# Patient Record
Sex: Female | Born: 1955 | Race: White | Hispanic: No | Marital: Married | Smoking: Current every day smoker
Health system: Southern US, Community
[De-identification: ages and names within clinical notes are randomized; demographics above are authoritative.]

## PROBLEM LIST (undated history)

## (undated) DIAGNOSIS — R0600 Dyspnea, unspecified: Secondary | ICD-10-CM

## (undated) DIAGNOSIS — I10 Essential (primary) hypertension: Secondary | ICD-10-CM

## (undated) DIAGNOSIS — C679 Malignant neoplasm of bladder, unspecified: Secondary | ICD-10-CM

## (undated) DIAGNOSIS — R569 Unspecified convulsions: Secondary | ICD-10-CM

## (undated) DIAGNOSIS — R519 Headache, unspecified: Secondary | ICD-10-CM

## (undated) DIAGNOSIS — K219 Gastro-esophageal reflux disease without esophagitis: Secondary | ICD-10-CM

## (undated) DIAGNOSIS — R4020 Unspecified coma: Secondary | ICD-10-CM

## (undated) DIAGNOSIS — E119 Type 2 diabetes mellitus without complications: Secondary | ICD-10-CM

## (undated) DIAGNOSIS — C3490 Malignant neoplasm of unspecified part of unspecified bronchus or lung: Secondary | ICD-10-CM

## (undated) DIAGNOSIS — J449 Chronic obstructive pulmonary disease, unspecified: Secondary | ICD-10-CM

## (undated) DIAGNOSIS — I700 Atherosclerosis of aorta: Secondary | ICD-10-CM

## (undated) DIAGNOSIS — F4310 Post-traumatic stress disorder, unspecified: Secondary | ICD-10-CM

## (undated) DIAGNOSIS — M1990 Unspecified osteoarthritis, unspecified site: Secondary | ICD-10-CM

## (undated) DIAGNOSIS — F32A Depression, unspecified: Secondary | ICD-10-CM

## (undated) DIAGNOSIS — F319 Bipolar disorder, unspecified: Secondary | ICD-10-CM

## (undated) DIAGNOSIS — D649 Anemia, unspecified: Secondary | ICD-10-CM

## (undated) DIAGNOSIS — J189 Pneumonia, unspecified organism: Secondary | ICD-10-CM

## (undated) DIAGNOSIS — E785 Hyperlipidemia, unspecified: Secondary | ICD-10-CM

## (undated) DIAGNOSIS — J4489 Other specified chronic obstructive pulmonary disease: Secondary | ICD-10-CM

## (undated) DIAGNOSIS — F419 Anxiety disorder, unspecified: Secondary | ICD-10-CM

## (undated) DIAGNOSIS — H269 Unspecified cataract: Secondary | ICD-10-CM

## (undated) DIAGNOSIS — F329 Major depressive disorder, single episode, unspecified: Secondary | ICD-10-CM

## (undated) DIAGNOSIS — D329 Benign neoplasm of meninges, unspecified: Secondary | ICD-10-CM

## (undated) DIAGNOSIS — S0990XA Unspecified injury of head, initial encounter: Secondary | ICD-10-CM

## (undated) HISTORY — DX: Unspecified injury of head, initial encounter: S09.90XA

## (undated) HISTORY — DX: Essential (primary) hypertension: I10

## (undated) HISTORY — DX: Type 2 diabetes mellitus without complications: E11.9

## (undated) HISTORY — PX: SPINE SURGERY: SHX786

## (undated) HISTORY — DX: Anxiety disorder, unspecified: F41.9

## (undated) HISTORY — DX: Unspecified cataract: H26.9

## (undated) HISTORY — DX: Chronic obstructive pulmonary disease, unspecified: J44.9

## (undated) HISTORY — DX: Hyperlipidemia, unspecified: E78.5

## (undated) HISTORY — PX: FOOT SURGERY: SHX648

## (undated) HISTORY — DX: Post-traumatic stress disorder, unspecified: F43.10

## (undated) HISTORY — DX: Unspecified coma: R40.20

## (undated) HISTORY — DX: Benign neoplasm of meninges, unspecified: D32.9

## (undated) HISTORY — DX: Malignant neoplasm of unspecified part of unspecified bronchus or lung: C34.90

## (undated) HISTORY — PX: CARPAL TUNNEL RELEASE: SHX101

## (undated) HISTORY — DX: Unspecified convulsions: R56.9

## (undated) HISTORY — DX: Depression, unspecified: F32.A

## (undated) HISTORY — DX: Bipolar disorder, unspecified: F31.9

## (undated) HISTORY — PX: TUBAL LIGATION: SHX77

## (undated) HISTORY — DX: Malignant neoplasm of bladder, unspecified: C67.9

## (undated) HISTORY — PX: OTHER SURGICAL HISTORY: SHX169

---
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
## 2006-12-03 ENCOUNTER — Emergency Department (HOSPITAL_COMMUNITY): Admission: EM | Admit: 2006-12-03 | Discharge: 2006-12-03 | Payer: Self-pay | Admitting: Emergency Medicine

## 2011-12-06 HISTORY — PX: CHOLECYSTECTOMY: SHX55

## 2013-12-05 HISTORY — PX: COLONOSCOPY: SHX174

## 2014-08-06 LAB — PULMONARY FUNCTION TEST

## 2015-06-04 LAB — PULMONARY FUNCTION TEST

## 2016-01-19 DIAGNOSIS — R001 Bradycardia, unspecified: Secondary | ICD-10-CM | POA: Insufficient documentation

## 2016-01-22 DIAGNOSIS — F3132 Bipolar disorder, current episode depressed, moderate: Secondary | ICD-10-CM | POA: Insufficient documentation

## 2019-10-09 ENCOUNTER — Other Ambulatory Visit (HOSPITAL_COMMUNITY): Payer: Self-pay | Admitting: Internal Medicine

## 2019-10-09 DIAGNOSIS — Z1231 Encounter for screening mammogram for malignant neoplasm of breast: Secondary | ICD-10-CM

## 2019-10-10 ENCOUNTER — Encounter: Payer: Self-pay | Admitting: Internal Medicine

## 2019-10-23 ENCOUNTER — Ambulatory Visit: Payer: Self-pay | Admitting: Gastroenterology

## 2019-10-29 ENCOUNTER — Other Ambulatory Visit: Payer: Self-pay | Admitting: Internal Medicine

## 2019-10-29 DIAGNOSIS — D329 Benign neoplasm of meninges, unspecified: Secondary | ICD-10-CM

## 2019-10-29 NOTE — Progress Notes (Signed)
Referring Provider: Abran Richard, MD Primary Care Physician:  Abran Richard, MD Primary Gastroenterologist:  Dr. Gala Romney  Chief Complaint  Patient presents with  . Consult    tcs last done 5 years ago. Had polyps. Also reports cdif x2 in the past  . change in bowels    change have diarrhea for 3-5 days to having no BM in 3-5 days    HPI:   Vicki Blankenship is a 63 y.o. female presenting today at the request of Abran Richard, MD for consult colonoscopy, office visit due to meds.   Today she states her last colonoscopy was 5 years ago. Reports having colon polyps. Prior to colonoscopy, she had C. Diff. This was treated. At the time of colonoscopy, states they told her she had C. Diff again and was treated a second time.  Her significant watery diarrhea resolved.  She is now with chronic alternating diarrhea and constipation. Thinks a lot of this is due to stress. Worsened with recent move from [place]. Diarrhea for 3-5 days.  Will have 3-4 typically loose, sometimes watery BMs daily with associated urgency and abdominal cramping that resolves after a BM. Ice cream will trigger diarrhea.  Was eating ice cream several days a week.  Has tried to decrease ice cream intake and has started eating yogurt with whipped cream.  Maceo Pro foods also worsen diarrhea.  No nocturnal stools. Has had bright red blood in the stool. Occurs a couple times a month. Depends on "how stressed I am."   After diarrhea, may go 3-5 days without a BM. Doesn't drink much water. Urine is more yellow.   Admits to reflux. Taking omeprazole. Taking first thing in the morning.  Continues with reflux symptoms daily. Worse at bedtime. Drinks sprite daily. Eats a lot of spicy foods. No dysphagia. Occasional nausea. No vomiting. Had lost about 25 lbs when stressed related to her move.  She and her husband finally moved into their home in October, and she has gained about 15 pounds back.  Admits to epigastric pain. Present for several years.  Pretty constant. Taking 10 aspirin a day for headaches.  States she is stopping aspirin today and starting a different medication for migraines that has been prescribed by her primary doctor.   Past Medical History:  Diagnosis Date  . Anxiety   . Bipolar disorder ([place])   . COPD (chronic obstructive pulmonary disease) ([place])   . Depression   . Diabetes ([place])   . HLD (hyperlipidemia)   . HTN (hypertension)   . PTSD (post-traumatic stress disorder)     Past Surgical History:  Procedure Laterality Date  . CHOLECYSTECTOMY  2013  . COLONOSCOPY  2015   In [place].  Per patient, she had colon polyps.    Current Outpatient Medications  Medication Sig Dispense Refill  . Ascorbic Acid (VITA-C PO) Take by mouth daily. Only in winter months    . aspirin EC 81 MG tablet Take 81 mg by mouth daily.    Marland Kitchen aspirin-acetaminophen-caffeine (EXCEDRIN MIGRAINE) 250-250-65 MG tablet Take by mouth every 6 (six) hours as needed for headache.    Marland Kitchen atorvastatin (LIPITOR) 40 MG tablet Take 40 mg by mouth daily.    . Cyanocobalamin (B-12 PO) Take by mouth daily.    . divalproex (DEPAKOTE) 250 MG DR tablet Take 250 mg by mouth at bedtime.    . hydrochlorothiazide (HYDRODIURIL) 25 MG tablet Take 25 mg by mouth daily.    Marland Kitchen lithium carbonate 150 MG  capsule 1 capsule in AM and 2 capsules in evening    . losartan (COZAAR) 25 MG tablet Take 25 mg by mouth daily.    . metFORMIN (GLUCOPHAGE) 500 MG tablet Take 500 mg by mouth 2 (two) times daily.    . Multiple Vitamin (MULTIVITAMIN) tablet Take 1 tablet by mouth daily.    . OXYGEN Inhale into the lungs. 2 liters at night    . QUEtiapine (SEROQUEL) 200 MG tablet Take 100 mg by mouth 2 (two) times daily.    Marland Kitchen venlafaxine (EFFEXOR) 25 MG tablet Take 25 mg by mouth daily.    Marland Kitchen VITAMIN D PO Take by mouth.    . pantoprazole (PROTONIX) 40 MG tablet Take 1 tablet (40 mg total) by mouth daily before breakfast. 30 tablet 5   No current facility-administered medications for  this visit.     Allergies as of 10/30/2019 - Review Complete 10/30/2019  Allergen Reaction Noted  . Penicillins  10/30/2019  . Wellbutrin [bupropion]  10/30/2019    Family History  Problem Relation Age of Onset  . Colon cancer Neg Hx     Social History   Socioeconomic History  . Marital status: Married    Spouse name: Not on file  . Number of children: Not on file  . Years of education: Not on file  . Highest education level: Not on file  Occupational History  . Not on file  Social Needs  . Financial resource strain: Not on file  . Food insecurity    Worry: Not on file    Inability: Not on file  . Transportation needs    Medical: Not on file    Non-medical: Not on file  Tobacco Use  . Smoking status: Current Every Day Smoker    Types: Cigarettes  . Smokeless tobacco: Never Used  Substance and Sexual Activity  . Alcohol use: Never    Frequency: Never  . Drug use: Never  . Sexual activity: Not on file  Lifestyle  . Physical activity    Days per week: Not on file    Minutes per session: Not on file  . Stress: Not on file  Relationships  . Social Herbalist on phone: Not on file    Gets together: Not on file    Attends religious service: Not on file    Active member of club or organization: Not on file    Attends meetings of clubs or organizations: Not on file    Relationship status: Not on file  . Intimate partner violence    Fear of current or ex partner: Not on file    Emotionally abused: Not on file    Physically abused: Not on file    Forced sexual activity: Not on file  Other Topics Concern  . Not on file  Social History Narrative  . Not on file    Review of Systems: Gen: Denies any fever, chills, pre-syncope, or syncope.  CV: Denies chest pain. Admits to occasional heart palpitations. Associated with anxiety.  Resp: Admits to shortness of breath at times and chronic smokers cough cough. Sleeps with 2L nasal canula due to COPD. GI: See  HPI GU : Had an episode of urinary incontinence recently.  Reports recent urinalysis with PCP was negative. Has discussed incontinent episode with PCP.  MS: Denies joint pain Derm: Denies rash Psych: Admits to depression, anxiety Heme: See HPI  Physical Exam: BP 135/82   Pulse 71   Temp 98.3  F (36.8 C) (Oral)   Ht 5\' 4"  (1.626 m)   Wt 181 lb 12.8 oz (82.5 kg)   BMI 31.21 kg/m  General:   Alert and oriented. Pleasant and cooperative. Well-nourished and well-developed.  Head:  Normocephalic and atraumatic. Eyes:  Without icterus, sclera clear and conjunctiva pink.  Ears:  Normal auditory acuity. Lungs:  Clear to auscultation bilaterally. No wheezes, rales, or rhonchi. No distress.  Heart:  S1, S2 present without murmurs appreciated.  Abdomen:  +BS, soft, non-distended.  Very mild tenderness to palpation in the epigastric area. No HSM noted. No guarding or rebound. No masses appreciated.  Rectal:  Deferred  Msk:  Symmetrical without gross deformities. Normal posture. Extremities: 1+ bilateral lower extremity pitting edema. Neurologic:  Alert and  oriented x4;  grossly normal neurologically. Skin:  Intact without significant lesions or rashes. Psych: Normal mood and affect.

## 2019-10-30 ENCOUNTER — Ambulatory Visit (INDEPENDENT_AMBULATORY_CARE_PROVIDER_SITE_OTHER): Payer: Medicare HMO | Admitting: Gastroenterology

## 2019-10-30 ENCOUNTER — Other Ambulatory Visit: Payer: Self-pay

## 2019-10-30 ENCOUNTER — Encounter: Payer: Self-pay | Admitting: Gastroenterology

## 2019-10-30 VITALS — BP 135/82 | HR 71 | Temp 98.3°F | Ht 64.0 in | Wt 181.8 lb

## 2019-10-30 DIAGNOSIS — Z86010 Personal history of colonic polyps: Secondary | ICD-10-CM | POA: Diagnosis not present

## 2019-10-30 DIAGNOSIS — K219 Gastro-esophageal reflux disease without esophagitis: Secondary | ICD-10-CM

## 2019-10-30 DIAGNOSIS — R198 Other specified symptoms and signs involving the digestive system and abdomen: Secondary | ICD-10-CM

## 2019-10-30 DIAGNOSIS — R1013 Epigastric pain: Secondary | ICD-10-CM

## 2019-10-30 MED ORDER — PANTOPRAZOLE SODIUM 40 MG PO TBEC: 40.0000 mg | DELAYED_RELEASE_TABLET | Freq: Every day | ORAL | 5 refills | Status: DC

## 2019-10-30 NOTE — Assessment & Plan Note (Addendum)
63 year old female with past medical history of anxiety, depression, bipolar disorder, diabetes, HTN, HLD, COPD on 2 L supplemental oxygen via nasal cannula at nighttime who presents to schedule her surveillance colonoscopy.  Last colonoscopy about 5 years ago in [place].  Per patient she had colon polyps.  Reports prior to colonoscopy, she had C. difficile and was treated, then at time of colonoscopy, was told she had C. difficile again and was treated a second time.  Her frequent watery diarrhea resolved but she continues to have alternating constipation and diarrhea for a week at a time.  With diarrhea, she will have 3-4 stools typically loose, but can be watery.  Worse with dairy products and fried foods.  Associated abdominal cramping that resolves after BM and urgency.  No nocturnal stools.  Also admits to chronic history of intermittent bright red blood in her stool that occurs a couple times a month.  She states it depends on "how stress I am."  Lost about 25 pounds when moving from [place] in July due to stress; however, she has gained about 15 pounds back.  Significant daily aspirin use for migraines. No family history of colon cancer.  Rectal bleeding may be secondary to hemorrhoids or colon polyps.  Doubt IBD or malignancy.  Suspect her diarrhea is likely to lactose intolerance versus IBS.  Proceed with TCS with propofol with Dr. Gala Romney in the near future. The risks, benefits, and alternatives have been discussed in detail with patient. They have stated understanding and desire to proceed.  Propofol due to polypharmacy. Add Benefiber or Metamucil daily. MiraLAX 1 capful daily as needed for constipation. Trial lactose-free diet x2 weeks due to diarrhea. Stop aspirin and avoid all NSAIDs. Follow-up in office after procedure.

## 2019-10-30 NOTE — Assessment & Plan Note (Addendum)
63 year old female with anxiety, depression, bipolar disorder, and diabetes who also has long history of alternating constipation and diarrhea.  Reports 2 episodes of C. difficile about 5 years ago.  Her significant diarrhea resolved after treatment, but she continues with alternating bowel patterns with diarrhea for 3-5 days followed by constipation for 3-5 days. With diarrhea, she will have 3-4 stools daily, typically loose, but can be watery.  Worse with dairy products and fried foods.  Associated abdominal cramping that resolves after BM and urgency.  No nocturnal stools.  Minimal water intake.  Also with chronic history of intermittent bright red blood in her stool that occurs a couple times a month.  States it depends on "how stress I am."  Lost about 25 pounds when moving from [place] in July due to stress; however, she has gained about 15 pounds back.  Significant daily aspirin use for migraines. No family history of colon cancer.  She is due for repeat colonoscopy at this time with last colonoscopy about 5 years ago in [place].  Per patient she had colon polyps.  Suspect patient's alternating bowel habits may be secondary to IBS mixed type versus baseline constipation with diarrhea secondary to lactose intolerance as she eats dairy frequently and notes worsening of diarrhea during these times.  Clinical picture not entirely consistent with IBD; however, with reported rectal bleeding, this is still in the differential.   Add Benefiber or Metamucil daily. MiraLAX 1 capful daily as needed for constipation. Trial lactose-free diet x2 weeks. Increase water intake. Proceed with TCS with propofol with Dr. Gala Romney in the near future. The risks, benefits, and alternatives have been discussed in detail with patient. They have stated understanding and desire to proceed.  She was advised to ensure her bowels are moving well with the help of MiraLAX as needed the week prior to her colonoscopy. Follow-up after  procedure.

## 2019-10-30 NOTE — Assessment & Plan Note (Signed)
Addressed under GERD

## 2019-10-30 NOTE — Patient Instructions (Addendum)
We will get you scheduled for a colonoscopy in the near future with Dr. Buford Dresser. 1 day prior to your procedure: Take metformin 250 mg in the morning and evening Day of your procedure: Hold diabetes medications the morning of your procedure.  Please stop omeprazole and start Protonix 40 mg daily 30 minutes before breakfast.  I have sent a prescription to your pharmacy.  Follow a GERD diet.  Avoid fried, fatty, greasy, spicy, and citrus foods.  Avoid carbonated beverages, caffeine, and chocolate.  Do not eat within 3 hours of laying down.  Try propping the head of your bed up on water bricks to create a 6 inch incline.  See GERD handout below.  Avoid all NSAIDs.  This includes aspirin, Aleve, ibuprofen, Advil, and Goody powders.  Please add Benefiber or Metamucil daily.  These are fiber supplements that should help with your constipation.  Start at a low dose and increase slowly.   You may use MiraLAX 1 capful daily as needed for intermittent constipation.  Please be sure your bowels are moving well the week before your colonoscopy.  Please try following a lactose-free diet for at least the next 2 weeks.  If you may inadvertently consume lactose/dairy, you should take Lactaid tablets prior to the meal.  We will follow-up with you in the office after your colonoscopy.  Aliene Altes, PA-C [facility]    Food Choices for Gastroesophageal Reflux Disease, Adult When you have gastroesophageal reflux disease (GERD), the foods you eat and your eating habits are very important. Choosing the right foods can help ease your discomfort. Think about working with a nutrition specialist (dietitian) to help you make good choices. What are tips for following this plan?  Meals  Choose healthy foods that are low in fat, such as fruits, vegetables, whole grains, low-fat dairy products, and lean meat, fish, and poultry.  Eat small meals often instead of 3 large meals a day. Eat your meals  slowly, and in a place where you are relaxed. Avoid bending over or lying down until 2-3 hours after eating.  Avoid eating meals 2-3 hours before bed.  Avoid drinking a lot of liquid with meals.  Cook foods using methods other than frying. Bake, grill, or broil food instead.  Avoid or limit: ? Chocolate. ? Peppermint or spearmint. ? Alcohol. ? Pepper. ? Black and decaffeinated coffee. ? Black and decaffeinated tea. ? Bubbly (carbonated) soft drinks. ? Caffeinated energy drinks and soft drinks.  Limit high-fat foods such as: ? Fatty meat or fried foods. ? Whole milk, cream, butter, or ice cream. ? Nuts and nut butters. ? Pastries, donuts, and sweets made with butter or shortening.  Avoid foods that cause symptoms. These foods may be different for everyone. Common foods that cause symptoms include: ? Tomatoes. ? Oranges, lemons, and limes. ? Peppers. ? Spicy food. ? Onions and garlic. ? Vinegar. Lifestyle  Maintain a healthy weight. Ask your doctor what weight is healthy for you. If you need to lose weight, work with your doctor to do so safely.  Exercise for at least 30 minutes for 5 or more days each week, or as told by your doctor.  Wear loose-fitting clothes.  Do not smoke. If you need help quitting, ask your doctor.  Sleep with the head of your bed higher than your feet. Use a wedge under the mattress or blocks under the bed frame to raise the head of the bed. Summary  When you have gastroesophageal reflux disease (GERD),  food and lifestyle choices are very important in easing your symptoms.  Eat small meals often instead of 3 large meals a day. Eat your meals slowly, and in a place where you are relaxed.  Limit high-fat foods such as fatty meat or fried foods.  Avoid bending over or lying down until 2-3 hours after eating.  Avoid peppermint and spearmint, caffeine, alcohol, and chocolate. This information is not intended to replace advice given to you by your  health care provider. Make sure you discuss any questions you have with your health care provider. Document Released: 05/22/2012 Document Revised: 03/14/2019 Document Reviewed: 12/27/2016 Elsevier Patient Education  [place].  Lactose-Free Diet, Adult If you have lactose intolerance, you are not able to digest lactose. Lactose is a natural sugar found mainly in dairy milk and dairy products. You may need to avoid all foods and beverages that contain lactose. A lactose-free diet can help you do this. Which foods have lactose? Lactose is found in dairy milk and dairy products, such as:  Yogurt.  Cheese.  Butter.  Margarine.  Sour cream.  Cream.  Whipped toppings and nondairy creamers.  Ice cream and other dairy-based desserts. Lactose is also found in foods or products made with dairy milk or milk ingredients. To find out whether a food contains dairy milk or a milk ingredient, look at the ingredients list. Avoid foods with the statement "May contain milk" and foods that contain:  Milk powder.  Whey.  Curd.  Caseinate.  Lactose.  Lactalbumin.  Lactoglobulin. What are alternatives to dairy milk and foods made with milk products?  Lactose-free milk.  Soy milk with added calcium and vitamin D.  Almond milk, coconut milk, rice milk, or other nondairy milk alternatives with added calcium and vitamin D. Note that these are low in protein.  Soy products, such as soy yogurt, soy cheese, soy ice cream, and soy-based sour cream.  Other nut milk products, such as almond yogurt, almond cheese, cashew yogurt, cashew cheese, cashew ice cream, coconut yogurt, and coconut ice cream. What are tips for following this plan?  Do not consume foods, beverages, vitamins, minerals, or medicines containing lactose. Read ingredient lists carefully.  Look for the words "lactose-free" on labels.  Use lactase enzyme drops or tablets as directed by your health care provider.  Use  lactose-free milk or a milk alternative, such as soy milk or almond milk, for drinking and cooking.  Make sure you get enough calcium and vitamin D in your diet. A lactose-free eating plan can be lacking in these important nutrients.  Take calcium and vitamin D supplements as directed by your health care provider. Talk to your health care provider about supplements if you are not able to get enough calcium and vitamin D from food. What foods can I eat?  Fruits All fresh, canned, frozen, or dried fruits that are not processed with lactose. Vegetables All fresh, frozen, and canned vegetables without cheese, cream, or butter sauces. Grains Any that are not made with dairy milk or dairy products. Meats and other proteins Any meat, fish, poultry, and other protein sources that are not made with dairy milk or dairy products. Soy cheese and yogurt. Fats and oils Any that are not made with dairy milk or dairy products. Beverages Lactose-free milk. Soy, rice, or almond milk with added calcium and vitamin D. Fruit and vegetable juices. Sweets and desserts Any that are not made with dairy milk or dairy products. Seasonings and condiments Any that are  not made with dairy milk or dairy products. Calcium Calcium is found in many foods that contain lactose and is important for bone health. The amount of calcium you need depends on your age:  Adults younger than 50 years: 1,000 mg of calcium a day.  Adults older than 50 years: 1,200 mg of calcium a day. If you are not getting enough calcium, you may get it from other sources, including:  Orange juice with calcium added. There are 300-350 mg of calcium in 1 cup of orange juice.  Calcium-fortified soy milk. There are 300-400 mg of calcium in 1 cup of calcium-fortified soy milk.  Calcium-fortified rice or almond milk. There are 300 mg of calcium in 1 cup of calcium-fortified rice or almond milk.  Calcium-fortified breakfast cereals. There are  100-1,000 mg of calcium in calcium-fortified breakfast cereals.  Spinach, cooked. There are 145 mg of calcium in  cup of cooked spinach.  Edamame, cooked. There are 130 mg of calcium in  cup of cooked edamame.  Collard greens, cooked. There are 125 mg of calcium in  cup of cooked collard greens.  Kale, frozen or cooked. There are 90 mg of calcium in  cup of cooked or frozen kale.  Almonds. There are 95 mg of calcium in  cup of almonds.  Broccoli, cooked. There are 60 mg of calcium in 1 cup of cooked broccoli. The items listed above may not be a complete list of recommended foods and beverages. Contact a dietitian for more options. What foods are not recommended? Fruits None, unless they are made with dairy milk or dairy products. Vegetables None, unless they are made with dairy milk or dairy products. Grains Any grains that are made with dairy milk or dairy products. Meats and other proteins None, unless they are made with dairy milk or dairy products. Dairy All dairy products, including milk, goat's milk, buttermilk, kefir, acidophilus milk, flavored milk, evaporated milk, condensed milk, dulce de [place], eggnog, yogurt, cheese, and cheese spreads. Fats and oils Any that are made with milk or milk products. Margarines and salad dressings that contain milk or cheese. Cream. Half and half. Cream cheese. Sour cream. Chip dips made with sour cream or yogurt. Beverages Hot chocolate. Cocoa with lactose. Instant iced teas. Powdered fruit drinks. Smoothies made with dairy milk or yogurt. Sweets and desserts Any that are made with milk or milk products. Seasonings and condiments Chewing gum that has lactose. Spice blends if they contain lactose. Artificial sweeteners that contain lactose. Nondairy creamers. The items listed above may not be a complete list of foods and beverages to avoid. Contact a dietitian for more information. Summary  If you are lactose intolerant, it means that  you have a hard time digesting lactose, a natural sugar found in milk and milk products.  Following a lactose-free diet can help you manage this condition.  Calcium is important for bone health and is found in many foods that contain lactose. Talk with your health care provider about other sources of calcium. This information is not intended to replace advice given to you by your health care provider. Make sure you discuss any questions you have with your health care provider. Document Released: 05/13/2002 Document Revised: 12/19/2017 Document Reviewed: 12/19/2017 Elsevier Patient Education  2020 [employer].

## 2019-10-30 NOTE — Assessment & Plan Note (Signed)
Patient has long history of GERD, constant epigastric pain, and occasional nausea without vomiting.  GERD symptoms are not adequately controlled.  Currently taking omeprazole 20 mg daily.  She continues with breakthrough reflux symptoms daily that are worse at bedtime.  Admits to eating spicy foods frequently and drinking Sprite daily.  Also has been consuming a significant amount of aspirin for headaches.  Denies dysphagia or melena.  She did lose about 25 pounds when moving from [place] in July due to stress; however, she has gained about 15 pounds back as they finally moved into their home in October.   I suspect dietary habits are likely contributing to her GERD symptoms.  Suspect she likely has gastritis or duodenitis secondary to significant NSAID use.  Nausea is likely related to her GERD symptoms and gastritis.   Stop omeprazole and start Protonix 40 mg daily 30 minutes before breakfast. Counseled on GERD lifestyle and diet.  Handout provided. Advise she stop aspirin and avoid all NSAIDs including ibuprofen, Aleve, Advil, and Goody powders. Follow-up after colonoscopy.

## 2019-11-04 ENCOUNTER — Other Ambulatory Visit: Payer: Self-pay

## 2019-11-04 ENCOUNTER — Telehealth: Payer: Self-pay | Admitting: Internal Medicine

## 2019-11-04 ENCOUNTER — Telehealth: Payer: Self-pay

## 2019-11-04 DIAGNOSIS — Z86010 Personal history of colonic polyps: Secondary | ICD-10-CM

## 2019-11-04 MED ORDER — PEG 3350-KCL-NA BICARB-NACL 420 G PO SOLR: 4000.0000 mL | ORAL | 0 refills | Status: DC

## 2019-11-04 NOTE — Telephone Encounter (Signed)
Called pt, TCS w/Propofol w/RMR scheduled for 02/10/20 at 8:15am. Rx for prep sent to pharmacy. Orders entered.

## 2019-11-04 NOTE — Telephone Encounter (Signed)
OPENED IN ERROR

## 2019-11-05 NOTE — Telephone Encounter (Signed)
Pre-op appt 02/07/20 at 1:45pm, COVID test 2:45pm. Appt letter mailed with procedure instructions.

## 2019-11-07 ENCOUNTER — Ambulatory Visit (HOSPITAL_COMMUNITY): Payer: Medicare HMO

## 2019-11-11 ENCOUNTER — Telehealth: Payer: Self-pay | Admitting: Internal Medicine

## 2019-11-11 NOTE — Telephone Encounter (Signed)
Spoke to pt, TCS w/Propofol w/RMR rescheduled to 02/20/20 at 8:15am. LMOVM to inform endo scheduler.

## 2019-11-11 NOTE — Telephone Encounter (Signed)
Pre-op and COVID test 02/18/20. Appt letter mailed with new procedure instructions.

## 2019-11-11 NOTE — Telephone Encounter (Signed)
Tried to call pt, no answer, LMOVM for return call.  

## 2019-11-11 NOTE — Telephone Encounter (Signed)
PATIENT CALLED AND WANTS TO RESCHEDULE THE PROCEDURE SINCE IT IS A DAY BEFORE HER BIRTHDAY...[phone]

## 2019-11-14 ENCOUNTER — Ambulatory Visit (HOSPITAL_COMMUNITY)
Admission: RE | Admit: 2019-11-14 | Discharge: 2019-11-14 | Disposition: A | Discharge status: Home / Self Care | Payer: Medicare HMO | Source: Ambulatory Visit | Attending: Internal Medicine | Admitting: Internal Medicine

## 2019-11-14 ENCOUNTER — Other Ambulatory Visit: Payer: Self-pay

## 2019-11-14 DIAGNOSIS — D329 Benign neoplasm of meninges, unspecified: Secondary | ICD-10-CM | POA: Insufficient documentation

## 2019-11-14 MED ORDER — GADOBUTROL 1 MMOL/ML IV SOLN
10.0000 mL | Freq: Once | INTRAVENOUS | Status: AC | PRN
  Administered 2019-11-14: 10 mL via INTRAVENOUS

## 2019-11-22 ENCOUNTER — Ambulatory Visit (HOSPITAL_COMMUNITY)
Admission: RE | Admit: 2019-11-22 | Discharge: 2019-11-22 | Disposition: A | Discharge status: Home / Self Care | Payer: Medicare HMO | Source: Ambulatory Visit | Attending: Internal Medicine | Admitting: Internal Medicine

## 2019-11-22 ENCOUNTER — Other Ambulatory Visit: Payer: Self-pay

## 2019-11-22 DIAGNOSIS — Z1231 Encounter for screening mammogram for malignant neoplasm of breast: Secondary | ICD-10-CM | POA: Diagnosis not present

## 2019-12-31 ENCOUNTER — Other Ambulatory Visit: Payer: Self-pay

## 2019-12-31 ENCOUNTER — Ambulatory Visit: Payer: Medicare HMO

## 2019-12-31 ENCOUNTER — Ambulatory Visit: Payer: Medicare HMO | Admitting: Orthopaedic Surgery

## 2019-12-31 ENCOUNTER — Encounter: Payer: Self-pay | Admitting: Orthopaedic Surgery

## 2019-12-31 ENCOUNTER — Telehealth: Payer: Self-pay | Admitting: Orthopaedic Surgery

## 2019-12-31 VITALS — BP 123/77 | HR 80 | Temp 96.3°F | Ht 64.0 in | Wt 185.0 lb

## 2019-12-31 DIAGNOSIS — M545 Low back pain, unspecified: Secondary | ICD-10-CM

## 2019-12-31 DIAGNOSIS — M5410 Radiculopathy, site unspecified: Secondary | ICD-10-CM

## 2019-12-31 MED ORDER — TRAMADOL HCL 50 MG PO TABS: 50.0000 mg | ORAL_TABLET | Freq: Four times a day (QID) | ORAL | 0 refills | Status: AC | PRN

## 2019-12-31 MED ORDER — PREDNISONE 5 MG (21) PO TBPK: ORAL_TABLET | ORAL | 0 refills | Status: DC

## 2019-12-31 NOTE — Telephone Encounter (Signed)
Patient called about her medication prescribed today - states [facility] has the Prednisone; patient states she understood that Dr Luna Glasgow was also ordering Tramadol? Please advise

## 2019-12-31 NOTE — Progress Notes (Signed)
Subjective:    Patient ID: Vicki Blankenship, female    DOB: 1956-05-04, 64 y.o.   MRN: [id number]  HPI She fell about a week before Christmas and landed in her bathtub. She is in a new house and was walking into it at night with the lights off.  She hurt her right hip and lower back. She has a long history of lower back pain, treated in [place]. She had a MRI about 5 or 6 years ago there. She has done fairly well with the back until now.    She has had right hip pain.  She had x-rays done on 12-10-2019 at [facility].  It showed mild degenerative changes with no fracture.  She continues to have some right hip pain but has pain running from the lower back to the medial right foot.  It is not getting any better.  I have reviewed the notes from [facility].  I did not have a disc to see the x-rays.  She has GERD and does not take NSAIDs.  She has no weakness, no numbness.  She has no bowel or bladder problems.  She is not getting any better and is concerned.  Her husband accompanied her today.   Review of Systems  Constitutional: Positive for activity change.  Musculoskeletal: Positive for arthralgias, back pain and myalgias.  Psychiatric/Behavioral: The patient is nervous/anxious.   All other systems reviewed and are negative.  For Review of Systems, all other systems reviewed and are negative.  The following is a summary of the past history medically, past history surgically, known current medicines, social history and family history.  This information is gathered electronically by the computer from prior information and documentation.  I review this each visit and have found including this information at this point in the chart is beneficial and informative.   Past Medical History:  Diagnosis Date  . Anxiety   . Bipolar disorder ([place])   . COPD (chronic obstructive pulmonary disease) ([place])   . Depression   . Diabetes ([place])   . HLD (hyperlipidemia)   . HTN (hypertension)   . PTSD  (Blankenship-traumatic stress disorder)     Past Surgical History:  Procedure Laterality Date  . CHOLECYSTECTOMY  2013  . COLONOSCOPY  2015   In [place].  Per patient, she had colon polyps.    Current Outpatient Medications on File Prior to Visit  Medication Sig Dispense Refill  . Ascorbic Acid (VITA-C PO) Take by mouth daily. Only in winter months    . aspirin EC 81 MG tablet Take 81 mg by mouth daily.    Marland Kitchen aspirin-acetaminophen-caffeine (EXCEDRIN MIGRAINE) 250-250-65 MG tablet Take by mouth every 6 (six) hours as needed for headache.    Marland Kitchen atorvastatin (LIPITOR) 40 MG tablet Take 40 mg by mouth daily.    . Cyanocobalamin (B-12 PO) Take by mouth daily.    . divalproex (DEPAKOTE) 250 MG DR tablet Take 250 mg by mouth at bedtime.    . hydrochlorothiazide (HYDRODIURIL) 25 MG tablet Take 25 mg by mouth daily.    Marland Kitchen lithium carbonate 150 MG capsule 1 capsule in AM and 2 capsules in evening    . losartan (COZAAR) 25 MG tablet Take 25 mg by mouth daily.    . metFORMIN (GLUCOPHAGE) 500 MG tablet Take 500 mg by mouth 2 (two) times daily.    . Multiple Vitamin (MULTIVITAMIN) tablet Take 1 tablet by mouth daily.    . OXYGEN Inhale into  the lungs. 2 liters at night    . pantoprazole (PROTONIX) 40 MG tablet Take 1 tablet (40 mg total) by mouth daily before breakfast. 30 tablet 5  . polyethylene glycol-electrolytes (TRILYTE) 420 g solution Take 4,000 mLs by mouth as directed. 4000 mL 0  . QUEtiapine (SEROQUEL) 200 MG tablet Take 100 mg by mouth 2 (two) times daily.    Marland Kitchen venlafaxine (EFFEXOR) 25 MG tablet Take 25 mg by mouth daily.    Marland Kitchen VITAMIN D PO Take by mouth.     No current facility-administered medications on file prior to visit.    Social History   Socioeconomic History  . Marital status: Married    Spouse name: Not on file  . Number of children: Not on file  . Years of education: Not on file  . Highest education level: Not on file  Occupational History  . Not on file  Tobacco Use  .  Smoking status: Current Every Day Smoker    Types: Cigarettes  . Smokeless tobacco: Never Used  Substance and Sexual Activity  . Alcohol use: Never  . Drug use: Never  . Sexual activity: Not on file  Other Topics Concern  . Not on file  Social History Narrative  . Not on file   Social Determinants of Health   Financial Resource Strain:   . Difficulty of Paying Living Expenses: Not on file  Food Insecurity:   . Worried About Charity fundraiser in the Last Year: Not on file  . Ran Out of Food in the Last Year: Not on file  Transportation Needs:   . Lack of Transportation (Medical): Not on file  . Lack of Transportation (Non-Medical): Not on file  Physical Activity:   . Days of Exercise per Week: Not on file  . Minutes of Exercise per Session: Not on file  Stress:   . Feeling of Stress : Not on file  Social Connections:   . Frequency of Communication with Friends and Family: Not on file  . Frequency of Social Gatherings with Friends and Family: Not on file  . Attends Religious Services: Not on file  . Active Member of Clubs or Organizations: Not on file  . Attends Archivist Meetings: Not on file  . Marital Status: Not on file  Intimate Partner Violence:   . Fear of Current or Ex-Partner: Not on file  . Emotionally Abused: Not on file  . Physically Abused: Not on file  . Sexually Abused: Not on file    Family History  Problem Relation Age of Onset  . Colon cancer Neg Hx     BP 123/77   Pulse 80   Temp (!) 96.3 F (35.7 C)   Ht 5\' 4"  (1.626 m)   Wt 185 lb (83.9 kg)   BMI 31.76 kg/m   Body mass index is 31.76 kg/m.     Objective:   Physical Exam Vitals and nursing note reviewed.  Constitutional:      Appearance: She is well-developed.  HENT:     Head: Normocephalic and atraumatic.  Eyes:     Conjunctiva/sclera: Conjunctivae normal.     Pupils: Pupils are equal, round, and reactive to light.  Cardiovascular:     Rate and Rhythm: Normal rate  and regular rhythm.  Pulmonary:     Effort: Pulmonary effort is normal.  Abdominal:     Palpations: Abdomen is soft.  Musculoskeletal:     Cervical back: Normal range of motion and neck  supple.       Back:  Skin:    General: Skin is warm and dry.  Neurological:     Mental Status: She is alert and oriented to person, place, and time.     Cranial Nerves: No cranial nerve deficit.     Motor: No abnormal muscle tone.     Coordination: Coordination normal.     Deep Tendon Reflexes: Reflexes are normal and symmetric. Reflexes normal.  Psychiatric:        Behavior: Behavior normal.        Thought Content: Thought content normal.        Judgment: Judgment normal.    X-rays of the lumbar spine were done, reported separately.       Assessment & Plan:   Encounter Diagnoses  Name Primary?  . Radicular pain of right lower extremity Yes  . Lumbar pain    I will begin prednisone dose pack.  After it is completed, take one Aleve twice a day after eating.  I hope this will help her right sided sciatica.    Return in two weeks.  Consider MRI if not improved.  Get old records from [place].  Call if any problem.  Precautions discussed.   Electronically Signed Sanjuana Kava, MD 1/26/202110:43 AM

## 2020-01-14 ENCOUNTER — Other Ambulatory Visit: Payer: Self-pay

## 2020-01-14 ENCOUNTER — Ambulatory Visit (INDEPENDENT_AMBULATORY_CARE_PROVIDER_SITE_OTHER): Payer: Medicare HMO | Admitting: Orthopaedic Surgery

## 2020-01-14 ENCOUNTER — Encounter: Payer: Self-pay | Admitting: Orthopaedic Surgery

## 2020-01-14 VITALS — BP 121/82 | HR 82 | Ht 64.0 in | Wt 185.0 lb

## 2020-01-14 DIAGNOSIS — M5410 Radiculopathy, site unspecified: Secondary | ICD-10-CM

## 2020-01-14 DIAGNOSIS — F17210 Nicotine dependence, cigarettes, uncomplicated: Secondary | ICD-10-CM

## 2020-01-14 DIAGNOSIS — M545 Low back pain, unspecified: Secondary | ICD-10-CM

## 2020-01-14 NOTE — Patient Instructions (Signed)
Steps to Quit Smoking Smoking tobacco is the leading cause of preventable death. It can affect almost every organ in the body. Smoking puts you and people around you at risk for many serious, long-lasting (chronic) diseases. Quitting smoking can be hard, but it is one of the best things that you can do for your health. It is never too late to quit. How do I get ready to quit? When you decide to quit smoking, make a plan to help you succeed. Before you quit:  Pick a date to quit. Set a date within the next 2 weeks to give you time to prepare.  Write down the reasons why you are quitting. Keep this list in places where you will see it often.  Tell your family, friends, and co-workers that you are quitting. Their support is important.  Talk with your doctor about the choices that may help you quit.  Find out if your health insurance will pay for these treatments.  Know the people, places, things, and activities that make you want to smoke (triggers). Avoid them. What first steps can I take to quit smoking?  Throw away all cigarettes at home, at work, and in your car.  Throw away the things that you use when you smoke, such as ashtrays and lighters.  Clean your car. Make sure to empty the ashtray.  Clean your home, including curtains and carpets. What can I do to help me quit smoking? Talk with your doctor about taking medicines and seeing a counselor at the same time. You are more likely to succeed when you do both.  If you are pregnant or breastfeeding, talk with your doctor about counseling or other ways to quit smoking. Do not take medicine to help you quit smoking unless your doctor tells you to do so. To quit smoking: Quit right away  Quit smoking totally, instead of slowly cutting back on how much you smoke over a period of time.  Go to counseling. You are more likely to quit if you go to counseling sessions regularly. Take medicine You may take medicines to help you quit. Some  medicines need a prescription, and some you can buy over-the-counter. Some medicines may contain a drug called nicotine to replace the nicotine in cigarettes. Medicines may:  Help you to stop having the desire to smoke (cravings).  Help to stop the problems that come when you stop smoking (withdrawal symptoms). Your doctor may ask you to use:  Nicotine patches, gum, or lozenges.  Nicotine inhalers or sprays.  Non-nicotine medicine that is taken by mouth. Find resources Find resources and other ways to help you quit smoking and remain smoke-free after you quit. These resources are most helpful when you use them often. They include:  Online chats with a counselor.  Phone quitlines.  Printed self-help materials.  Support groups or group counseling.  Text messaging programs.  Mobile phone apps. Use apps on your mobile phone or tablet that can help you stick to your quit plan. There are many free apps for mobile phones and tablets as well as websites. Examples include Quit Guide from the CDC and smokefree.gov  What things can I do to make it easier to quit?   Talk to your family and friends. Ask them to support and encourage you.  Call a phone quitline (1-800-QUIT-NOW), reach out to support groups, or work with a counselor.  Ask people who smoke to not smoke around you.  Avoid places that make you want to smoke,   such as: ? Bars. ? Parties. ? Smoke-break areas at work.  Spend time with people who do not smoke.  Lower the stress in your life. Stress can make you want to smoke. Try these things to help your stress: ? Getting regular exercise. ? Doing deep-breathing exercises. ? Doing yoga. ? Meditating. ? Doing a body scan. To do this, close your eyes, focus on one area of your body at a time from head to toe. Notice which parts of your body are tense. Try to relax the muscles in those areas. How will I feel when I quit smoking? Day 1 to 3 weeks Within the first 24 hours,  you may start to have some problems that come from quitting tobacco. These problems are very bad 2-3 days after you quit, but they do not often last for more than 2-3 weeks. You may get these symptoms:  Mood swings.  Feeling restless, nervous, angry, or annoyed.  Trouble concentrating.  Dizziness.  Strong desire for high-sugar foods and nicotine.  Weight gain.  Trouble pooping (constipation).  Feeling like you may vomit (nausea).  Coughing or a sore throat.  Changes in how the medicines that you take for other issues work in your body.  Depression.  Trouble sleeping (insomnia). Week 3 and afterward After the first 2-3 weeks of quitting, you may start to notice more positive results, such as:  Better sense of smell and taste.  Less coughing and sore throat.  Slower heart rate.  Lower blood pressure.  Clearer skin.  Better breathing.  Fewer sick days. Quitting smoking can be hard. Do not give up if you fail the first time. Some people need to try a few times before they succeed. Do your best to stick to your quit plan, and talk with your doctor if you have any questions or concerns. Summary  Smoking tobacco is the leading cause of preventable death. Quitting smoking can be hard, but it is one of the best things that you can do for your health.  When you decide to quit smoking, make a plan to help you succeed.  Quit smoking right away, not slowly over a period of time.  When you start quitting, seek help from your doctor, family, or friends. This information is not intended to replace advice given to you by your health care provider. Make sure you discuss any questions you have with your health care provider. Document Revised: 08/16/2019 Document Reviewed: 02/09/2019 Elsevier Patient Education  2020 Elsevier Inc.  

## 2020-01-14 NOTE — Progress Notes (Signed)
Patient ID:Vicki Blankenship, female DOB:1956/11/08, 64 y.o. OZH:[id number]  Chief Complaint  Patient presents with  . Back Pain    better prednisone helped     HPI  Vicki Blankenship is a 64 y.o. female who has lower back pain with right sided sciatica. She had improvement with the prednisone dose pack.  She has used Aspercreme too.  She has a TENS unit and I told her to use that.  I would like to get a MRI of the lumbar spine to rule out HNP.  She brought in X-rays from [place] and I have independently reviewed them of the pelvis and hip on the right.  No fracture is noted.  She is to continue her regular medicines.   Body mass index is 31.76 kg/m.  ROS  Review of Systems  Constitutional: Positive for activity change.  Musculoskeletal: Positive for arthralgias, back pain and myalgias.  Psychiatric/Behavioral: The patient is nervous/anxious.   All other systems reviewed and are negative.   All other systems reviewed and are negative.  The following is a summary of the past history medically, past history surgically, known current medicines, social history and family history.  This information is gathered electronically by the computer from prior information and documentation.  I review this each visit and have found including this information at this point in the chart is beneficial and informative.    Past Medical History:  Diagnosis Date  . Anxiety   . Bipolar disorder ([place])   . COPD (chronic obstructive pulmonary disease) ([place])   . Depression   . Diabetes ([place])   . HLD (hyperlipidemia)   . HTN (hypertension)   . PTSD (post-traumatic stress disorder)     Past Surgical History:  Procedure Laterality Date  . CHOLECYSTECTOMY  2013  . COLONOSCOPY  2015   In [place].  Per patient, she had colon polyps.    Family History  Problem Relation Age of Onset  . Colon cancer Neg Hx     Social History Social History   Tobacco Use  . Smoking status: Current Every Day Smoker   Types: Cigarettes  . Smokeless tobacco: Never Used  Substance Use Topics  . Alcohol use: Never  . Drug use: Never    Allergies  Allergen Reactions  . Penicillins     Tongue swelling  . Wellbutrin [Bupropion]     shakes    Current Outpatient Medications  Medication Sig Dispense Refill  . Ascorbic Acid (VITA-C PO) Take by mouth daily. Only in winter months    . aspirin EC 81 MG tablet Take 81 mg by mouth daily.    Marland Kitchen aspirin-acetaminophen-caffeine (EXCEDRIN MIGRAINE) 250-250-65 MG tablet Take by mouth every 6 (six) hours as needed for headache.    Marland Kitchen atorvastatin (LIPITOR) 40 MG tablet Take 40 mg by mouth daily.    . clonazePAM (KLONOPIN) 2 MG tablet Take 2 mg by mouth 3 (three) times daily as needed.    . Cyanocobalamin (B-12 PO) Take by mouth daily.    . divalproex (DEPAKOTE) 250 MG DR tablet Take 250 mg by mouth at bedtime.    . hydrochlorothiazide (HYDRODIURIL) 25 MG tablet Take 25 mg by mouth daily.    Marland Kitchen lithium carbonate 150 MG capsule 1 capsule in AM and 2 capsules in evening    . losartan (COZAAR) 25 MG tablet Take 25 mg by mouth daily.    . metFORMIN (GLUCOPHAGE) 500 MG tablet Take 500 mg by mouth 2 (two) times daily.    Marland Kitchen  Multiple Vitamin (MULTIVITAMIN) tablet Take 1 tablet by mouth daily.    . OXYGEN Inhale into the lungs. 2 liters at night    . pantoprazole (PROTONIX) 40 MG tablet Take 1 tablet (40 mg total) by mouth daily before breakfast. 30 tablet 5  . polyethylene glycol-electrolytes (TRILYTE) 420 g solution Take 4,000 mLs by mouth as directed. 4000 mL 0  . QUEtiapine (SEROQUEL) 200 MG tablet Take 100 mg by mouth 2 (two) times daily.    . SUMAtriptan (IMITREX) 50 MG tablet     . venlafaxine (EFFEXOR) 25 MG tablet Take 25 mg by mouth daily.    Marland Kitchen VITAMIN D PO Take by mouth.    Marland Kitchen albuterol (VENTOLIN HFA) 108 (90 Base) MCG/ACT inhaler      No current facility-administered medications for this visit.     Physical Exam  Blood pressure 121/82, pulse 82, height 5\' 4"   (1.626 m), weight 185 lb (83.9 kg).  Constitutional: overall normal hygiene, normal nutrition, well developed, normal grooming, normal body habitus. Assistive device:none  Musculoskeletal: gait and station Limp none, muscle tone and strength are normal, no tremors or atrophy is present.  .  Neurological: coordination overall normal.  Deep tendon reflex/nerve stretch intact.  Sensation normal.  Cranial nerves II-XII intact.   Skin:   Normal overall no scars, lesions, ulcers or rashes. No psoriasis.  Psychiatric: Alert and oriented x 3.  Recent memory intact, remote memory unclear.  Normal mood and affect. Well groomed.  Good eye contact.  Cardiovascular: overall no swelling, no varicosities, no edema bilaterally, normal temperatures of the legs and arms, no clubbing, cyanosis and good capillary refill.  Spine/Pelvis examination:  Inspection:  Overall, sacoiliac joint benign and hips nontender; without crepitus or defects.   Thoracic spine inspection: Alignment normal without kyphosis present   Lumbar spine inspection:  Alignment  with normal lumbar lordosis, without scoliosis apparent.   Thoracic spine palpation:  without tenderness of spinal processes   Lumbar spine palpation: without tenderness of lumbar area; without tightness of lumbar muscles    Range of Motion:   Lumbar flexion, forward flexion is normal without pain or tenderness    Lumbar extension is full without pain or tenderness   Left lateral bend is normal without pain or tenderness   Right lateral bend is normal without pain or tenderness   Straight leg raising is normal  Strength & tone: normal   Stability overall normal stability  Lymphatic: palpation is normal.  All other systems reviewed and are negative   The patient has been educated about the nature of the problem(s) and counseled on treatment options.  The patient appeared to understand what I have discussed and is in agreement with it.  Encounter  Diagnoses  Name Primary?  . Radicular pain of right lower extremity Yes  . Lumbar pain   . Nicotine dependence, cigarettes, uncomplicated     PLAN Call if any problems.  Precautions discussed.  Continue current medications.   Return to clinic 2 weeks   Get MRI of the lumbar spine.  Electronically Signed Sanjuana Kava, MD 2/9/20219:32 AM

## 2020-01-23 ENCOUNTER — Ambulatory Visit (HOSPITAL_COMMUNITY): Payer: Medicare HMO

## 2020-01-28 ENCOUNTER — Ambulatory Visit: Payer: Medicare HMO | Admitting: Orthopaedic Surgery

## 2020-02-03 ENCOUNTER — Telehealth: Payer: Self-pay | Admitting: Internal Medicine

## 2020-02-03 NOTE — Telephone Encounter (Signed)
Noted. She does not currently have a follow-up appointment. We can arrange for sometime in April or May 2021 if she is agreeable.   Vicki Blankenship, please arrange follow-up appointment.

## 2020-02-03 NOTE — Telephone Encounter (Signed)
Tried to call pt,no answer, LMOVM to inform her procedure has been cancelled. She can call office when she is ready to reschedule.  Endo scheduler informed.  FYI to [facility].

## 2020-02-03 NOTE — Telephone Encounter (Signed)
Pt's husband called to cancel patient's procedure with [facility] on 02/20/2020. She wants to wait until she gets her covid vaccine before she reschedules

## 2020-02-04 NOTE — Telephone Encounter (Signed)
Noted  

## 2020-02-04 NOTE — Telephone Encounter (Signed)
ADDED HER TO RECALL SO SHE WILL GET A LETTER TO MAKE FOLLOW UP APPOINTMENT

## 2020-02-07 ENCOUNTER — Other Ambulatory Visit (HOSPITAL_COMMUNITY): Payer: Medicare HMO

## 2020-02-07 ENCOUNTER — Ambulatory Visit (HOSPITAL_COMMUNITY): Payer: Medicare HMO

## 2020-02-11 ENCOUNTER — Ambulatory Visit: Payer: Medicare HMO | Admitting: Orthopaedic Surgery

## 2020-02-18 ENCOUNTER — Other Ambulatory Visit (HOSPITAL_COMMUNITY): Payer: Medicare HMO

## 2020-02-20 ENCOUNTER — Ambulatory Visit (HOSPITAL_COMMUNITY): Admit: 2020-02-20 | Payer: Medicare HMO | Admitting: Internal Medicine

## 2020-02-20 ENCOUNTER — Encounter (HOSPITAL_COMMUNITY): Payer: Self-pay

## 2020-02-20 SURGERY — COLONOSCOPY WITH PROPOFOL
Anesthesia: Monitor Anesthesia Care

## 2020-02-27 ENCOUNTER — Other Ambulatory Visit: Payer: Self-pay

## 2020-02-27 ENCOUNTER — Ambulatory Visit (HOSPITAL_COMMUNITY)
Admission: RE | Admit: 2020-02-27 | Discharge: 2020-02-27 | Disposition: A | Discharge status: Home / Self Care | Payer: Medicare HMO | Source: Ambulatory Visit | Attending: Orthopaedic Surgery | Admitting: Orthopaedic Surgery

## 2020-02-27 DIAGNOSIS — M5410 Radiculopathy, site unspecified: Secondary | ICD-10-CM | POA: Diagnosis not present

## 2020-02-27 DIAGNOSIS — M545 Low back pain, unspecified: Secondary | ICD-10-CM

## 2020-03-03 ENCOUNTER — Ambulatory Visit: Payer: Medicare HMO | Admitting: Orthopaedic Surgery

## 2020-03-03 ENCOUNTER — Other Ambulatory Visit: Payer: Self-pay

## 2020-03-03 ENCOUNTER — Encounter: Payer: Self-pay | Admitting: Orthopaedic Surgery

## 2020-03-03 VITALS — Temp 96.9°F | Ht 64.0 in | Wt 169.0 lb

## 2020-03-03 DIAGNOSIS — M545 Low back pain, unspecified: Secondary | ICD-10-CM

## 2020-03-03 MED ORDER — HYDROCODONE-ACETAMINOPHEN 5-325 MG PO TABS: ORAL_TABLET | ORAL | 0 refills | Status: DC

## 2020-03-03 NOTE — Progress Notes (Signed)
Patient ID:Vicki Blankenship, female DOB:02-Nov-1956, 64 y.o. HWE:[id number]  Chief Complaint  Patient presents with  . Back Pain    LBP with radicular pain    HPI  Vicki Blankenship is a 64 y.o. female who has continued lower back pain.  She had MRI which showed: IMPRESSION: 1. Severe L4-L5 spinal canal stenosis with moderate right and mild left neural foraminal narrowing, mostly secondary to facet degeneration superimposed on congenitally small spinal canal. 2. Left subarticular disc extrusion at L5-S1 with impingement of the traversing left S1 nerve root. There is also moderate spinal canal stenosis, moderate right and severe left neural foraminal narrowing at this level.  She had epidurals in the past that did not help when living in [place].  I will have her see the neurosurgeon for further evaluation. She is agreeable to this.  I have given handicap form for car.     Body mass index is 29.01 kg/m.  ROS  Review of Systems  All other systems reviewed and are negative.  The following is a summary of the past history medically, past history surgically, known current medicines, social history and family history.  This information is gathered electronically by the computer from prior information and documentation.  I review this each visit and have found including this information at this point in the chart is beneficial and informative.    Past Medical History:  Diagnosis Date  . Anxiety   . Bipolar disorder ([place])   . COPD (chronic obstructive pulmonary disease) ([place])   . Depression   . Diabetes ([place])   . HLD (hyperlipidemia)   . HTN (hypertension)   . PTSD (post-traumatic stress disorder)     Past Surgical History:  Procedure Laterality Date  . CHOLECYSTECTOMY  2013  . COLONOSCOPY  2015   In [place].  Per patient, she had colon polyps.    Family History  Problem Relation Age of Onset  . Colon cancer Neg Hx     Social History Social History   Tobacco Use  . Smoking  status: Current Every Day Smoker    Types: Cigarettes  . Smokeless tobacco: Never Used  Substance Use Topics  . Alcohol use: Never  . Drug use: Never    Allergies  Allergen Reactions  . Penicillins     Tongue swelling  . Wellbutrin [Bupropion]     shakes    Current Outpatient Medications  Medication Sig Dispense Refill  . albuterol (VENTOLIN HFA) 108 (90 Base) MCG/ACT inhaler     . Ascorbic Acid (VITA-C PO) Take by mouth daily. Only in winter months    . aspirin EC 81 MG tablet Take 81 mg by mouth daily.    Marland Kitchen aspirin-acetaminophen-caffeine (EXCEDRIN MIGRAINE) 250-250-65 MG tablet Take by mouth every 6 (six) hours as needed for headache.    Marland Kitchen atorvastatin (LIPITOR) 40 MG tablet Take 40 mg by mouth daily.    . Cyanocobalamin (B-12 PO) Take by mouth daily.    . divalproex (DEPAKOTE) 250 MG DR tablet Take 250 mg by mouth at bedtime.    . hydrochlorothiazide (HYDRODIURIL) 25 MG tablet Take 25 mg by mouth daily.    Marland Kitchen lithium carbonate 150 MG capsule 1 capsule in AM and 2 capsules in evening    . losartan (COZAAR) 25 MG tablet Take 25 mg by mouth daily.    . metFORMIN (GLUCOPHAGE) 500 MG tablet Take 500 mg by mouth 2 (two) times daily.    . Multiple Vitamin (MULTIVITAMIN) tablet Take 1  tablet by mouth daily.    . OXYGEN Inhale into the lungs. 2 liters at night    . pantoprazole (PROTONIX) 40 MG tablet Take 1 tablet (40 mg total) by mouth daily before breakfast. 30 tablet 5  . polyethylene glycol-electrolytes (TRILYTE) 420 g solution Take 4,000 mLs by mouth as directed. 4000 mL 0  . QUEtiapine (SEROQUEL) 200 MG tablet Take 100 mg by mouth 2 (two) times daily.    . SUMAtriptan (IMITREX) 50 MG tablet     . venlafaxine (EFFEXOR) 100 MG tablet Take 225 mg by mouth 2 (two) times daily.    Marland Kitchen VITAMIN D PO Take by mouth.    . clonazePAM (KLONOPIN) 2 MG tablet Take 2 mg by mouth 3 (three) times daily as needed.    Marland Kitchen HYDROcodone-acetaminophen (NORCO/VICODIN) 5-325 MG tablet One tablet every four  hours as needed for acute pain.  Limit of five days per [place] statue. 30 tablet 0   No current facility-administered medications for this visit.     Physical Exam  Temperature (!) 96.9 F (36.1 C), height 5\' 4"  (1.626 m), weight 169 lb (76.7 kg).  Constitutional: overall normal hygiene, normal nutrition, well developed, normal grooming, normal body habitus. Assistive device:none  Musculoskeletal: gait and station Limp left, muscle tone and strength are normal, no tremors or atrophy is present.  .  Neurological: coordination overall normal.  Deep tendon reflex/nerve stretch intact.  Sensation normal.  Cranial nerves II-XII intact.   Skin:   Normal overall no scars, lesions, ulcers or rashes. No psoriasis.  Psychiatric: Alert and oriented x 3.  Recent memory intact, remote memory unclear.  Normal mood and affect. Well groomed.  Good eye contact.  Cardiovascular: overall no swelling, no varicosities, no edema bilaterally, normal temperatures of the legs and arms, no clubbing, cyanosis and good capillary refill.  Lymphatic: palpation is normal.  Spine/Pelvis examination:  Inspection:  Overall, sacoiliac joint benign and hips nontender; without crepitus or defects.   Thoracic spine inspection: Alignment normal without kyphosis present   Lumbar spine inspection:  Alignment  with normal lumbar lordosis, without scoliosis apparent.   Thoracic spine palpation:  without tenderness of spinal processes   Lumbar spine palpation: without tenderness of lumbar area; without tightness of lumbar muscles    Range of Motion:   Lumbar flexion, forward flexion is normal without pain or tenderness    Lumbar extension is full without pain or tenderness   Left lateral bend is normal without pain or tenderness   Right lateral bend is normal without pain or tenderness   Straight leg raising is normal  Strength & tone: normal   Stability overall normal stability  All other systems reviewed and  are negative   The patient has been educated about the nature of the problem(s) and counseled on treatment options.  The patient appeared to understand what I have discussed and is in agreement with it.  Encounter Diagnosis  Name Primary?  . Lumbar pain Yes    PLAN Call if any problems.  Precautions discussed.  Continue current medications.   Return to clinic to neurosurgeon   I have reviewed the [place] web site prior to prescribing narcotic medicine for this patient.   Electronically Signed Sanjuana Kava, MD 3/30/20213:08 PM

## 2020-03-19 ENCOUNTER — Encounter: Payer: Self-pay | Admitting: Internal Medicine

## 2020-04-07 ENCOUNTER — Other Ambulatory Visit: Payer: Self-pay | Admitting: Neurological Surgery

## 2020-04-11 ENCOUNTER — Other Ambulatory Visit (HOSPITAL_COMMUNITY)
Admission: RE | Admit: 2020-04-11 | Discharge: 2020-04-11 | Disposition: A | Discharge status: Home / Self Care | Payer: Medicare HMO | Source: Ambulatory Visit | Attending: Neurological Surgery | Admitting: Neurological Surgery

## 2020-04-11 DIAGNOSIS — Z01812 Encounter for preprocedural laboratory examination: Secondary | ICD-10-CM | POA: Insufficient documentation

## 2020-04-11 DIAGNOSIS — Z20822 Contact with and (suspected) exposure to covid-19: Secondary | ICD-10-CM | POA: Diagnosis not present

## 2020-04-11 LAB — SARS CORONAVIRUS 2 (TAT 6-24 HRS): SARS Coronavirus 2: NEGATIVE

## 2020-04-13 ENCOUNTER — Other Ambulatory Visit: Payer: Self-pay

## 2020-04-13 ENCOUNTER — Encounter (HOSPITAL_COMMUNITY): Payer: Self-pay | Admitting: Neurological Surgery

## 2020-04-13 NOTE — Anesthesia Preprocedure Evaluation (Addendum)
Anesthesia Evaluation  Patient identified by MRN, date of birth, ID band Patient awake    Reviewed: Allergy & Precautions, NPO status , Patient's Chart, lab work & pertinent test results  History of Anesthesia Complications Negative for: history of anesthetic complications  Airway Mallampati: II  TM Distance: >3 FB Neck ROM: Full    Dental  (+) Dental Advisory Given, Lower Dentures, Upper Dentures   Pulmonary COPD, Current Smoker and Patient abstained from smoking.,    Pulmonary exam normal        Cardiovascular hypertension, Pt. on medications Normal cardiovascular exam     Neuro/Psych  Headaches, PSYCHIATRIC DISORDERS Anxiety Depression Bipolar Disorder    GI/Hepatic Neg liver ROS, GERD  ,  Endo/Other  diabetes  Renal/GU negative Renal ROS     Musculoskeletal   Abdominal   Peds  Hematology   Anesthesia Other Findings   Reproductive/Obstetrics                            Anesthesia Physical Anesthesia Plan  ASA: III  Anesthesia Plan: General   Post-op Pain Management:    Induction: Intravenous  PONV Risk Score and Plan: 3 and Ondansetron, Dexamethasone and Midazolam  Airway Management Planned: Oral ETT  Additional Equipment:   Intra-op Plan:   Post-operative Plan: Extubation in OR  Informed Consent: I have reviewed the patients History and Physical, chart, labs and discussed the procedure including the risks, benefits and alternatives for the proposed anesthesia with the patient or authorized representative who has indicated his/her understanding and acceptance.     Dental advisory given  Plan Discussed with: Anesthesiologist and CRNA  Anesthesia Plan Comments: (COPD followed by PCP Dr. Abran Richard. Pt uses 2L supplemental O2 QHS and with activity. )       Anesthesia Quick Evaluation

## 2020-04-13 NOTE — Progress Notes (Addendum)
Vicki Blankenship denies chest pain or shortness of breath at this time.  Patient has COPD and wears at 2 liters when she is exerting herself and at bedtime. Vicki Blankenship's PCP is Dr Abran Richard at  [facility].  I am requesting records. I informed patient that we have wheel chairs at the door that she may use to get to Admission office and then to the Pre- op desk, I asked if she will need oxygen to get to loction in a w/c, she said yes. I am asking anesthesiology PA-C to review chart. Vicki Blankenship tested negative for Covid on 4/9 and has been in [place] with her husband since that time.

## 2020-04-14 ENCOUNTER — Observation Stay (HOSPITAL_COMMUNITY)
Admission: RE | Admit: 2020-04-14 | Discharge: 2020-04-15 | Disposition: A | Discharge status: Home / Self Care | Payer: Medicare HMO | Attending: Neurological Surgery | Admitting: Neurological Surgery

## 2020-04-14 ENCOUNTER — Ambulatory Visit (HOSPITAL_COMMUNITY): Payer: Medicare HMO | Admitting: Physician Assistant

## 2020-04-14 ENCOUNTER — Ambulatory Visit (HOSPITAL_COMMUNITY): Payer: Medicare HMO

## 2020-04-14 ENCOUNTER — Encounter (HOSPITAL_COMMUNITY)
Admission: RE | Disposition: A | Discharge status: Home / Self Care | Payer: Self-pay | Source: Home / Self Care | Attending: Neurological Surgery

## 2020-04-14 ENCOUNTER — Other Ambulatory Visit: Payer: Self-pay

## 2020-04-14 ENCOUNTER — Encounter (HOSPITAL_COMMUNITY): Payer: Self-pay | Admitting: Neurological Surgery

## 2020-04-14 DIAGNOSIS — F319 Bipolar disorder, unspecified: Secondary | ICD-10-CM | POA: Insufficient documentation

## 2020-04-14 DIAGNOSIS — R2681 Unsteadiness on feet: Secondary | ICD-10-CM | POA: Diagnosis not present

## 2020-04-14 DIAGNOSIS — E119 Type 2 diabetes mellitus without complications: Secondary | ICD-10-CM | POA: Insufficient documentation

## 2020-04-14 DIAGNOSIS — G43909 Migraine, unspecified, not intractable, without status migrainosus: Secondary | ICD-10-CM | POA: Diagnosis not present

## 2020-04-14 DIAGNOSIS — M48062 Spinal stenosis, lumbar region with neurogenic claudication: Secondary | ICD-10-CM | POA: Insufficient documentation

## 2020-04-14 DIAGNOSIS — I10 Essential (primary) hypertension: Secondary | ICD-10-CM | POA: Insufficient documentation

## 2020-04-14 DIAGNOSIS — M1990 Unspecified osteoarthritis, unspecified site: Secondary | ICD-10-CM | POA: Diagnosis not present

## 2020-04-14 DIAGNOSIS — E785 Hyperlipidemia, unspecified: Secondary | ICD-10-CM | POA: Diagnosis not present

## 2020-04-14 DIAGNOSIS — Z419 Encounter for procedure for purposes other than remedying health state, unspecified: Secondary | ICD-10-CM

## 2020-04-14 DIAGNOSIS — J449 Chronic obstructive pulmonary disease, unspecified: Secondary | ICD-10-CM | POA: Diagnosis not present

## 2020-04-14 DIAGNOSIS — K219 Gastro-esophageal reflux disease without esophagitis: Secondary | ICD-10-CM | POA: Diagnosis not present

## 2020-04-14 DIAGNOSIS — M5416 Radiculopathy, lumbar region: Principal | ICD-10-CM | POA: Insufficient documentation

## 2020-04-14 HISTORY — DX: Headache, unspecified: R51.9

## 2020-04-14 HISTORY — DX: Unspecified osteoarthritis, unspecified site: M19.90

## 2020-04-14 HISTORY — PX: LUMBAR LAMINECTOMY/ DECOMPRESSION WITH MET-RX: SHX5959

## 2020-04-14 HISTORY — DX: Pneumonia, unspecified organism: J18.9

## 2020-04-14 HISTORY — DX: Dyspnea, unspecified: R06.00

## 2020-04-14 HISTORY — DX: Gastro-esophageal reflux disease without esophagitis: K21.9

## 2020-04-14 LAB — GLUCOSE, CAPILLARY
Glucose-Capillary: 121 mg/dL — ABNORMAL HIGH (ref 70–99)
Glucose-Capillary: 127 mg/dL — ABNORMAL HIGH (ref 70–99)
Glucose-Capillary: 180 mg/dL — ABNORMAL HIGH (ref 70–99)
Glucose-Capillary: 126 mg/dL — ABNORMAL HIGH (ref 70–99)

## 2020-04-14 LAB — HEMOGLOBIN A1C
Hgb A1c MFr Bld: 5.9 % — ABNORMAL HIGH (ref 4.8–5.6)
Mean Plasma Glucose: 122.63 mg/dL

## 2020-04-14 LAB — CBC
HCT: 48.9 % — ABNORMAL HIGH (ref 36.0–46.0)
Hemoglobin: 15.3 g/dL — ABNORMAL HIGH (ref 12.0–15.0)
MCH: 31.6 pg (ref 26.0–34.0)
MCHC: 31.3 g/dL (ref 30.0–36.0)
MCV: 101 fL — ABNORMAL HIGH (ref 80.0–100.0)
Platelets: 262 10*3/uL (ref 150–400)
RBC: 4.84 MIL/uL (ref 3.87–5.11)
RDW: 13 % (ref 11.5–15.5)
WBC: 9.2 10*3/uL (ref 4.0–10.5)
nRBC: 0 % (ref 0.0–0.2)

## 2020-04-14 LAB — BASIC METABOLIC PANEL
Anion gap: 12 (ref 5–15)
BUN: <5 mg/dL — ABNORMAL LOW (ref 8–23)
CO2: 35 mmol/L — ABNORMAL HIGH (ref 22–32)
Calcium: 9.9 mg/dL (ref 8.9–10.3)
Chloride: 95 mmol/L — ABNORMAL LOW (ref 98–111)
Creatinine, Ser: 0.63 mg/dL (ref 0.44–1.00)
GFR calc Af Amer: >60 mL/min (ref >60)
GFR calc non Af Amer: >60 mL/min (ref >60)
Glucose, Bld: 109 mg/dL — ABNORMAL HIGH (ref 70–99)
Potassium: 3 mmol/L — ABNORMAL LOW (ref 3.5–5.1)
Sodium: 142 mmol/L (ref 135–145)

## 2020-04-14 LAB — SURGICAL PCR SCREEN
MRSA, PCR: NEGATIVE
Staphylococcus aureus: NEGATIVE

## 2020-04-14 SURGERY — LUMBAR LAMINECTOMY/ DECOMPRESSION WITH MET-RX
Anesthesia: General | Laterality: Left

## 2020-04-14 MED ORDER — SODIUM CHLORIDE 0.9% FLUSH: 3.0000 mL | Freq: Two times a day (BID) | INTRAVENOUS | Status: DC

## 2020-04-14 MED ORDER — FENTANYL CITRATE (PF) 100 MCG/2ML IJ SOLN: 25.0000 ug | INTRAMUSCULAR | Status: DC | PRN

## 2020-04-14 MED ORDER — CLONAZEPAM 0.5 MG PO TABS
0.5000 mg | ORAL_TABLET | Freq: Two times a day (BID) | ORAL | Status: DC
  Administered 2020-04-14 – 2020-04-15 (×2): 0.5 mg via ORAL
  Filled 2020-04-14 (×2): qty 1

## 2020-04-14 MED ORDER — ALBUTEROL SULFATE (2.5 MG/3ML) 0.083% IN NEBU: 3.0000 mL | INHALATION_SOLUTION | Freq: Four times a day (QID) | RESPIRATORY_TRACT | Status: DC | PRN

## 2020-04-14 MED ORDER — MIDAZOLAM HCL 2 MG/2ML IJ SOLN
INTRAMUSCULAR | Status: DC | PRN
  Administered 2020-04-14: 2 mg via INTRAVENOUS

## 2020-04-14 MED ORDER — HYDROMORPHONE HCL 1 MG/ML IJ SOLN
1.0000 mg | INTRAMUSCULAR | Status: DC | PRN
  Administered 2020-04-14: 1 mg via INTRAVENOUS
  Filled 2020-04-14: qty 1

## 2020-04-14 MED ORDER — POLYETHYLENE GLYCOL 3350 17 G PO PACK: 17.0000 g | PACK | Freq: Every day | ORAL | Status: DC | PRN

## 2020-04-14 MED ORDER — PROMETHAZINE HCL 25 MG/ML IJ SOLN: 6.2500 mg | INTRAMUSCULAR | Status: DC | PRN

## 2020-04-14 MED ORDER — QUETIAPINE FUMARATE 300 MG PO TABS
300.0000 mg | ORAL_TABLET | Freq: Every day | ORAL | Status: DC
  Administered 2020-04-14: 300 mg via ORAL
  Filled 2020-04-14: qty 1

## 2020-04-14 MED ORDER — ACETAMINOPHEN 500 MG PO TABS
ORAL_TABLET | ORAL | Status: AC
  Administered 2020-04-14: 1000 mg via ORAL
  Filled 2020-04-14: qty 2

## 2020-04-14 MED ORDER — ACETAMINOPHEN 500 MG PO TABS: 1000.0000 mg | ORAL_TABLET | Freq: Once | ORAL | Status: AC

## 2020-04-14 MED ORDER — DIVALPROEX SODIUM ER 250 MG PO TB24
250.0000 mg | ORAL_TABLET | Freq: Every day | ORAL | Status: DC
  Administered 2020-04-14: 250 mg via ORAL
  Filled 2020-04-14: qty 1

## 2020-04-14 MED ORDER — OXYCODONE HCL 5 MG PO TABS
ORAL_TABLET | ORAL | Status: AC
  Filled 2020-04-14: qty 1

## 2020-04-14 MED ORDER — LOSARTAN POTASSIUM 50 MG PO TABS
25.0000 mg | ORAL_TABLET | Freq: Every day | ORAL | Status: DC
  Administered 2020-04-14 – 2020-04-15 (×2): 25 mg via ORAL
  Filled 2020-04-14 (×2): qty 1

## 2020-04-14 MED ORDER — LACTATED RINGERS IV SOLN: INTRAVENOUS | Status: DC | PRN

## 2020-04-14 MED ORDER — ATORVASTATIN CALCIUM 40 MG PO TABS
40.0000 mg | ORAL_TABLET | Freq: Every day | ORAL | Status: DC
  Administered 2020-04-15: 40 mg via ORAL
  Filled 2020-04-14: qty 1

## 2020-04-14 MED ORDER — DOCUSATE SODIUM 100 MG PO CAPS
100.0000 mg | ORAL_CAPSULE | Freq: Two times a day (BID) | ORAL | Status: DC
  Administered 2020-04-14 – 2020-04-15 (×2): 100 mg via ORAL
  Filled 2020-04-14 (×2): qty 1

## 2020-04-14 MED ORDER — FENTANYL CITRATE (PF) 250 MCG/5ML IJ SOLN
INTRAMUSCULAR | Status: AC
  Filled 2020-04-14: qty 5

## 2020-04-14 MED ORDER — 0.9 % SODIUM CHLORIDE (POUR BTL) OPTIME
TOPICAL | Status: DC | PRN
  Administered 2020-04-14: 1000 mL

## 2020-04-14 MED ORDER — ONDANSETRON HCL 4 MG PO TABS: 4.0000 mg | ORAL_TABLET | Freq: Four times a day (QID) | ORAL | Status: DC | PRN

## 2020-04-14 MED ORDER — SODIUM CHLORIDE 0.9 % IV SOLN
INTRAVENOUS | Status: DC | PRN
  Administered 2020-04-14: 500 mL

## 2020-04-14 MED ORDER — PROPOFOL 10 MG/ML IV BOLUS
INTRAVENOUS | Status: AC
  Filled 2020-04-14: qty 20

## 2020-04-14 MED ORDER — ONDANSETRON HCL 4 MG/2ML IJ SOLN
INTRAMUSCULAR | Status: DC | PRN
  Administered 2020-04-14: 4 mg via INTRAVENOUS

## 2020-04-14 MED ORDER — VANCOMYCIN HCL IN DEXTROSE 1-5 GM/200ML-% IV SOLN
1000.0000 mg | INTRAVENOUS | Status: AC
  Administered 2020-04-14: 1000 mg via INTRAVENOUS
  Filled 2020-04-14: qty 200

## 2020-04-14 MED ORDER — PANTOPRAZOLE SODIUM 40 MG PO TBEC
40.0000 mg | DELAYED_RELEASE_TABLET | Freq: Every day | ORAL | Status: DC
  Administered 2020-04-15: 40 mg via ORAL
  Filled 2020-04-14 (×2): qty 1

## 2020-04-14 MED ORDER — METFORMIN HCL 500 MG PO TABS
500.0000 mg | ORAL_TABLET | Freq: Two times a day (BID) | ORAL | Status: DC
  Administered 2020-04-14 – 2020-04-15 (×2): 500 mg via ORAL
  Filled 2020-04-14 (×2): qty 1

## 2020-04-14 MED ORDER — PHENYLEPHRINE 40 MCG/ML (10ML) SYRINGE FOR IV PUSH (FOR BLOOD PRESSURE SUPPORT)
PREFILLED_SYRINGE | INTRAVENOUS | Status: AC
  Filled 2020-04-14: qty 20

## 2020-04-14 MED ORDER — THROMBIN 5000 UNITS EX SOLR
CUTANEOUS | Status: AC
  Filled 2020-04-14: qty 5000

## 2020-04-14 MED ORDER — HYDROCHLOROTHIAZIDE 25 MG PO TABS
25.0000 mg | ORAL_TABLET | Freq: Every day | ORAL | Status: DC
  Administered 2020-04-15: 25 mg via ORAL
  Filled 2020-04-14: qty 1

## 2020-04-14 MED ORDER — CELECOXIB 200 MG PO CAPS: 200.0000 mg | ORAL_CAPSULE | Freq: Once | ORAL | Status: AC

## 2020-04-14 MED ORDER — LITHIUM CARBONATE 150 MG PO CAPS
150.0000 mg | ORAL_CAPSULE | Freq: Two times a day (BID) | ORAL | Status: DC
  Administered 2020-04-14 – 2020-04-15 (×2): 150 mg via ORAL
  Filled 2020-04-14 (×2): qty 1

## 2020-04-14 MED ORDER — LIDOCAINE 2% (20 MG/ML) 5 ML SYRINGE
INTRAMUSCULAR | Status: DC | PRN
  Administered 2020-04-14: 80 mg via INTRAVENOUS

## 2020-04-14 MED ORDER — ONDANSETRON HCL 4 MG/2ML IJ SOLN: 4.0000 mg | Freq: Four times a day (QID) | INTRAMUSCULAR | Status: DC | PRN

## 2020-04-14 MED ORDER — ALBUTEROL SULFATE (2.5 MG/3ML) 0.083% IN NEBU
INHALATION_SOLUTION | RESPIRATORY_TRACT | Status: AC
  Administered 2020-04-14: 2.5 mg via RESPIRATORY_TRACT
  Filled 2020-04-14: qty 3

## 2020-04-14 MED ORDER — ACETAMINOPHEN 325 MG PO TABS
650.0000 mg | ORAL_TABLET | ORAL | Status: DC | PRN
  Administered 2020-04-14 (×2): 650 mg via ORAL
  Filled 2020-04-14 (×2): qty 2

## 2020-04-14 MED ORDER — ONDANSETRON HCL 4 MG/2ML IJ SOLN
INTRAMUSCULAR | Status: AC
  Filled 2020-04-14: qty 4

## 2020-04-14 MED ORDER — ALBUTEROL SULFATE (2.5 MG/3ML) 0.083% IN NEBU: 2.5000 mg | INHALATION_SOLUTION | Freq: Once | RESPIRATORY_TRACT | Status: AC

## 2020-04-14 MED ORDER — DEXAMETHASONE SODIUM PHOSPHATE 10 MG/ML IJ SOLN
INTRAMUSCULAR | Status: AC
  Filled 2020-04-14: qty 2

## 2020-04-14 MED ORDER — ROCURONIUM BROMIDE 10 MG/ML (PF) SYRINGE
PREFILLED_SYRINGE | INTRAVENOUS | Status: DC | PRN
  Administered 2020-04-14: 80 mg via INTRAVENOUS

## 2020-04-14 MED ORDER — MIDAZOLAM HCL 2 MG/2ML IJ SOLN
INTRAMUSCULAR | Status: AC
  Filled 2020-04-14: qty 2

## 2020-04-14 MED ORDER — ACETAMINOPHEN 650 MG RE SUPP: 650.0000 mg | RECTAL | Status: DC | PRN

## 2020-04-14 MED ORDER — THROMBIN 5000 UNITS EX SOLR
OROMUCOSAL | Status: DC | PRN
  Administered 2020-04-14: 5 mL via TOPICAL

## 2020-04-14 MED ORDER — LIDOCAINE-EPINEPHRINE 1 %-1:100000 IJ SOLN
INTRAMUSCULAR | Status: DC | PRN
  Administered 2020-04-14: 9 mL

## 2020-04-14 MED ORDER — PROPOFOL 10 MG/ML IV BOLUS
INTRAVENOUS | Status: DC | PRN
  Administered 2020-04-14: 170 mg via INTRAVENOUS

## 2020-04-14 MED ORDER — ROCURONIUM BROMIDE 10 MG/ML (PF) SYRINGE
PREFILLED_SYRINGE | INTRAVENOUS | Status: AC
  Filled 2020-04-14: qty 20

## 2020-04-14 MED ORDER — CELECOXIB 200 MG PO CAPS
ORAL_CAPSULE | ORAL | Status: AC
  Administered 2020-04-14: 200 mg via ORAL
  Filled 2020-04-14: qty 1

## 2020-04-14 MED ORDER — VENLAFAXINE HCL ER 75 MG PO CP24
225.0000 mg | ORAL_CAPSULE | Freq: Every day | ORAL | Status: DC
  Administered 2020-04-15: 225 mg via ORAL
  Filled 2020-04-14: qty 3

## 2020-04-14 MED ORDER — FENTANYL CITRATE (PF) 250 MCG/5ML IJ SOLN
INTRAMUSCULAR | Status: DC | PRN
  Administered 2020-04-14: 100 ug via INTRAVENOUS
  Administered 2020-04-14: 50 ug via INTRAVENOUS

## 2020-04-14 MED ORDER — LIDOCAINE 2% (20 MG/ML) 5 ML SYRINGE
INTRAMUSCULAR | Status: AC
  Filled 2020-04-14: qty 10

## 2020-04-14 MED ORDER — PHENOL 1.4 % MT LIQD: 1.0000 | OROMUCOSAL | Status: DC | PRN

## 2020-04-14 MED ORDER — ADULT MULTIVITAMIN W/MINERALS CH
1.0000 | ORAL_TABLET | Freq: Every day | ORAL | Status: DC
  Administered 2020-04-14 – 2020-04-15 (×2): 1 via ORAL
  Filled 2020-04-14 (×2): qty 1

## 2020-04-14 MED ORDER — MENTHOL 3 MG MT LOZG: 1.0000 | LOZENGE | OROMUCOSAL | Status: DC | PRN

## 2020-04-14 MED ORDER — CHLORHEXIDINE GLUCONATE CLOTH 2 % EX PADS: 6.0000 | MEDICATED_PAD | Freq: Once | CUTANEOUS | Status: DC

## 2020-04-14 MED ORDER — SODIUM CHLORIDE 0.9% FLUSH: 3.0000 mL | INTRAVENOUS | Status: DC | PRN

## 2020-04-14 MED ORDER — PHENYLEPHRINE HCL-NACL 10-0.9 MG/250ML-% IV SOLN
INTRAVENOUS | Status: DC | PRN
  Administered 2020-04-14: 20 ug/min via INTRAVENOUS

## 2020-04-14 MED ORDER — DEXAMETHASONE SODIUM PHOSPHATE 10 MG/ML IJ SOLN
INTRAMUSCULAR | Status: DC | PRN
  Administered 2020-04-14: 10 mg via INTRAVENOUS

## 2020-04-14 MED ORDER — SODIUM CHLORIDE 0.9 % IV SOLN: 250.0000 mL | INTRAVENOUS | Status: DC

## 2020-04-14 MED ORDER — OXYCODONE HCL 5 MG PO TABS
5.0000 mg | ORAL_TABLET | ORAL | Status: DC | PRN
  Administered 2020-04-14 (×3): 5 mg via ORAL
  Filled 2020-04-14 (×2): qty 1

## 2020-04-14 MED ORDER — PHENYLEPHRINE 40 MCG/ML (10ML) SYRINGE FOR IV PUSH (FOR BLOOD PRESSURE SUPPORT)
PREFILLED_SYRINGE | INTRAVENOUS | Status: DC | PRN
  Administered 2020-04-14 (×2): 80 ug via INTRAVENOUS

## 2020-04-14 MED ORDER — CYCLOBENZAPRINE HCL 10 MG PO TABS
10.0000 mg | ORAL_TABLET | Freq: Three times a day (TID) | ORAL | Status: DC | PRN
  Administered 2020-04-14 – 2020-04-15 (×2): 10 mg via ORAL
  Filled 2020-04-14 (×2): qty 1

## 2020-04-14 MED ORDER — OXYCODONE HCL 5 MG PO TABS
10.0000 mg | ORAL_TABLET | ORAL | Status: DC | PRN
  Administered 2020-04-15: 10 mg via ORAL
  Filled 2020-04-14 (×2): qty 2

## 2020-04-14 MED ORDER — LIDOCAINE-EPINEPHRINE 1 %-1:100000 IJ SOLN
INTRAMUSCULAR | Status: AC
  Filled 2020-04-14: qty 1

## 2020-04-14 SURGICAL SUPPLY — 51 items
ADH SKN CLS APL DERMABOND .7 (GAUZE/BANDAGES/DRESSINGS) ×1
BAG DECANTER FOR FLEXI CONT (MISCELLANEOUS) ×2 IMPLANT
BAND INSRT 18 STRL LF DISP RB (MISCELLANEOUS) ×2
BAND RUBBER #18 3X1/16 STRL (MISCELLANEOUS) ×4 IMPLANT
BLADE CLIPPER SURG (BLADE) IMPLANT
BLADE SURG 11 STRL SS (BLADE) ×2 IMPLANT
BUR MATCHSTICK NEURO 3.0 LAGG (BURR) ×2 IMPLANT
BUR PRECISION FLUTE 5.0 (BURR) IMPLANT
CANISTER SUCT 3000ML PPV (MISCELLANEOUS) ×2 IMPLANT
COVER WAND RF STERILE (DRAPES) ×2 IMPLANT
DECANTER SPIKE VIAL GLASS SM (MISCELLANEOUS) ×2 IMPLANT
DERMABOND ADVANCED (GAUZE/BANDAGES/DRESSINGS) ×1
DERMABOND ADVANCED .7 DNX12 (GAUZE/BANDAGES/DRESSINGS) ×1 IMPLANT
DRAPE C-ARM 42X72 X-RAY (DRAPES) ×4 IMPLANT
DRAPE LAPAROTOMY 100X72X124 (DRAPES) ×2 IMPLANT
DRAPE MICROSCOPE LEICA (MISCELLANEOUS) ×2 IMPLANT
DRAPE SURG 17X23 STRL (DRAPES) ×2 IMPLANT
DURAPREP 26ML APPLICATOR (WOUND CARE) ×2 IMPLANT
ELECT BLADE 6.5 EXT (BLADE) ×2 IMPLANT
ELECT REM PT RETURN 9FT ADLT (ELECTROSURGICAL) ×2
ELECTRODE REM PT RTRN 9FT ADLT (ELECTROSURGICAL) ×1 IMPLANT
GAUZE 4X4 16PLY RFD (DISPOSABLE) IMPLANT
GAUZE SPONGE 4X4 12PLY STRL (GAUZE/BANDAGES/DRESSINGS) IMPLANT
GLOVE BIO SURGEON STRL SZ7.5 (GLOVE) ×2 IMPLANT
GLOVE BIOGEL PI IND STRL 7.5 (GLOVE) ×1 IMPLANT
GLOVE BIOGEL PI INDICATOR 7.5 (GLOVE) ×1
GLOVE EXAM NITRILE LRG STRL (GLOVE) IMPLANT
GLOVE EXAM NITRILE XL STR (GLOVE) IMPLANT
GLOVE EXAM NITRILE XS STR PU (GLOVE) IMPLANT
GOWN STRL REUS W/ TWL LRG LVL3 (GOWN DISPOSABLE) ×2 IMPLANT
GOWN STRL REUS W/ TWL XL LVL3 (GOWN DISPOSABLE) IMPLANT
GOWN STRL REUS W/TWL 2XL LVL3 (GOWN DISPOSABLE) IMPLANT
GOWN STRL REUS W/TWL LRG LVL3 (GOWN DISPOSABLE) ×4
GOWN STRL REUS W/TWL XL LVL3 (GOWN DISPOSABLE)
HEMOSTAT POWDER KIT SURGIFOAM (HEMOSTASIS) ×2 IMPLANT
KIT BASIN OR (CUSTOM PROCEDURE TRAY) ×2 IMPLANT
KIT TURNOVER KIT B (KITS) ×2 IMPLANT
NDL SPNL 18GX3.5 QUINCKE PK (NEEDLE) ×1 IMPLANT
NEEDLE HYPO 22GX1.5 SAFETY (NEEDLE) ×2 IMPLANT
NEEDLE SPNL 18GX3.5 QUINCKE PK (NEEDLE) ×2 IMPLANT
NS IRRIG 1000ML POUR BTL (IV SOLUTION) ×2 IMPLANT
PACK LAMINECTOMY NEURO (CUSTOM PROCEDURE TRAY) ×2 IMPLANT
PAD ARMBOARD 7.5X6 YLW CONV (MISCELLANEOUS) ×6 IMPLANT
SPONGE LAP 4X18 RFD (DISPOSABLE) IMPLANT
SUT MNCRL AB 3-0 PS2 18 (SUTURE) ×1 IMPLANT
SUT VIC AB 0 CT1 18XCR BRD8 (SUTURE) IMPLANT
SUT VIC AB 0 CT1 8-18 (SUTURE)
SUT VIC AB 2-0 CP2 18 (SUTURE) ×2 IMPLANT
TOWEL GREEN STERILE (TOWEL DISPOSABLE) ×2 IMPLANT
TOWEL GREEN STERILE FF (TOWEL DISPOSABLE) ×2 IMPLANT
WATER STERILE IRR 1000ML POUR (IV SOLUTION) ×2 IMPLANT

## 2020-04-14 NOTE — Brief Op Note (Signed)
04/14/2020  11:56 AM  PATIENT:  Vicki Blankenship  64 y.o. female  PRE-OPERATIVE DIAGNOSIS:  Lumbar stenosis with neurogenic claudication  Blankenship-OPERATIVE DIAGNOSIS:  Lumbar stenosis with neurogenic claudication  PROCEDURE:  Procedure(s) with comments: Left Lumbar Four-Five Minimally invasive lumbar decompression (Left) - Left Lumbar Four-Five Minimally invasive lumbar decompression  SURGEON:  Surgeon(s) and Role:    * Judith Part, MD - Primary    * Consuella Lose, MD - Assisting  PHYSICIAN ASSISTANT:   ANESTHESIA:   general  EBL:  50 mL   BLOOD ADMINISTERED:none  DRAINS: none   LOCAL MEDICATIONS USED:  LIDOCAINE   SPECIMEN:  No Specimen  DISPOSITION OF SPECIMEN:  N/A  COUNTS:  YES  TOURNIQUET:  * No tourniquets in log *  DICTATION: .Note written in EPIC  PLAN OF CARE: Admit for overnight observation  PATIENT DISPOSITION:  PACU - hemodynamically stable.   Delay start of Pharmacological VTE agent (>24hrs) due to surgical blood loss or risk of bleeding: yes

## 2020-04-14 NOTE — Op Note (Addendum)
PATIENT: Vicki Blankenship  DAY OF SURGERY: 04/14/20   PRE-OPERATIVE DIAGNOSIS:  Lumbar radiculopathy   POST-OPERATIVE DIAGNOSIS:  Lumbar radiculopathy   PROCEDURE:  Minimally invasive L4-5 laminectomy   SURGEON:  Surgeon(s) and Role:    Judith Part, MD - Primary    Consuella Lose, MD - Assistant   ANESTHESIA: ETGA   BRIEF HISTORY: This is a 65 year old woman who presented with bilateral lower extremity radicular pain. The patient was found to have severe L4-5 central canal stenosis. I therefore recommended a minimally invasive L4-5 laminectomy. This was discussed with the patient as well as risks, benefits, and alternatives and the patient wished to proceed with surgical treatment.   OPERATIVE DETAIL: The patient was taken to the operating room and placed on the OR table in the prone position. A formal time out was performed with two patient identifiers and confirmed the operative site. Anesthesia was induced by the anesthesia team. The operative site was marked, hair was clipped with surgical clippers, the area was then prepped and draped in a sterile fashion. Fluoroscopy was used to identify the surgical level with a spinal needle. A 2cm incision was then marked 1.5cm off to the right of midline. The fascia was incised sharply and serial dilators were docked to the spinolaminar interface on L4. A final tubular distractor was placed and secured to the table and fluoro again confirmed the correct surgical level. The spinous process, lamina, and facet locations were confirmed for orientation. A high speed drill was then used to perform an L4 laminotomy until the insertion of the ligamentum flavum was identified superiorly. This was then extended to complete the right hemilaminotomy by extending medially to the midline and laterally to the lateral insertion of the ligamentum flavum. This included a medial facetectomy to decompress the lateral recess. The table was tilted and tubular dilator was  adjusted to look across midline. An 'over the top' decompression was then performed by dissecting the ligamentum flavum off of the spinous process and lamina and identifying its insertion superiorly on the lamina and laterally at the facet. A #10 suction was then used to protect the thecal sac while the contralateral lamina was removed. A medial facetectomy was performed on the left as well to decompress the lateral recess. With drilling complete, the ligamentum flavum was then completely resected from its insertion and dura was palpated in all directions to confirm good decompression. Hemostasis was obtained, the wound was copiously irrigated, and the tube was removed while using the microscope to confirm hemostasis of the muscle edges. All instrument and sponge counts were correct and the incision was then closed in layers. The patient was then returned to anesthesia for emergence. No apparent complications at the completion of the procedure.   EBL:  41mL   DRAINS: none   SPECIMENS: none   Judith Part, MD 04/14/20 9:32 AM

## 2020-04-14 NOTE — Anesthesia Procedure Notes (Signed)
Procedure Name: Intubation Date/Time: 04/14/2020 10:12 AM Performed by: Janace Litten, CRNA Pre-anesthesia Checklist: Patient identified, Emergency Drugs available, Suction available and Patient being monitored Patient Re-evaluated:Patient Re-evaluated prior to induction Oxygen Delivery Method: Circle System Utilized Preoxygenation: Pre-oxygenation with 100% oxygen Induction Type: IV induction Ventilation: Mask ventilation without difficulty Laryngoscope Size: Mac and 3 Grade View: Grade I Tube type: Oral Tube size: 7.0 mm Number of attempts: 1 Airway Equipment and Method: Stylet Placement Confirmation: ETT inserted through vocal cords under direct vision,  positive ETCO2 and breath sounds checked- equal and bilateral Secured at: 21 cm Tube secured with: Tape Dental Injury: Teeth and Oropharynx as per pre-operative assessment

## 2020-04-14 NOTE — Anesthesia Postprocedure Evaluation (Signed)
Anesthesia Post Note  Patient: Vicki Blankenship  Procedure(s) Performed: Left Lumbar Four-Five Minimally invasive lumbar decompression (Left )     Patient location during evaluation: PACU Anesthesia Type: General Level of consciousness: sedated Pain management: pain level controlled Vital Signs Assessment: post-procedure vital signs reviewed and stable Respiratory status: spontaneous breathing and respiratory function stable Cardiovascular status: stable Postop Assessment: no apparent nausea or vomiting Anesthetic complications: no    Last Vitals:  Vitals:   04/14/20 1300 04/14/20 1317  BP: 113/79 111/67  Pulse: 70 66  Resp: 18 16  Temp:  37.2 C  SpO2: 91% 92%    Last Pain:  Vitals:   04/14/20 1317  PainSc: 2     LLE Motor Response: Purposeful movement;Responds to commands (04/14/20 1317) LLE Sensation: Full sensation (04/14/20 1317) RLE Motor Response: Purposeful movement;Responds to commands (04/14/20 1317) RLE Sensation: Full sensation (04/14/20 1317)      [place]

## 2020-04-14 NOTE — H&P (Signed)
Surgical H&P Update  HPI: 64 y.o. woman with a history of back and BLE radicular symptoms s/p multiple ESIs and now refractory symptoms. She is here today for an MIS decompression. No changes in health since she was last seen. Still having radicular pain and wishes to proceed with surgery.  PMHx:  Past Medical History:  Diagnosis Date  . Anxiety   . Arthritis   . Bipolar disorder ([place])   . COPD (chronic obstructive pulmonary disease) ([place])   . Depression   . Diabetes ([place])    Type II  . Dyspnea   . GERD (gastroesophageal reflux disease)   . Headache    migraines- 3 times a week  . HLD (hyperlipidemia)   . HTN (hypertension)   . Pneumonia   . PTSD (post-traumatic stress disorder)    FamHx:  Family History  Problem Relation Age of Onset  . Colon cancer Neg Hx    SocHx:  reports that she has been smoking cigarettes. She has a 20.00 pack-year smoking history. She has never used smokeless tobacco. She reports that she does not drink alcohol or use drugs.  Physical Exam: AOx3, PERRL, FS, TM  Strength 5/5 x4 except 4+/5 in R EHL, SILTx4 except b/l L5 numbness  Assesment/Plan: 64 y.o. woman with lumbar spinal stenosis with neurogenic claudication, here for MIS decompression. Risks, benefits, and alternatives discussed and the patient would like to continue with surgery.  -OR today -3C post-op  Judith Part, MD 04/14/20 9:21 AM

## 2020-04-14 NOTE — Progress Notes (Signed)
Neurosurgery Service Post-operative progress note  Assessment & Plan: 64 y.o. woman s/p MIS lami, seen in PACU, strength/sensation intact in BLE, recovering well.  -3C overnight -activity/diet as tolerated  Judith Part  04/14/20 12:11 PM

## 2020-04-14 NOTE — Transfer of Care (Signed)
Immediate Anesthesia Transfer of Care Note  Patient: Vicki Blankenship  Procedure(s) Performed: Left Lumbar Four-Five Minimally invasive lumbar decompression (Left )  Patient Location: PACU  Anesthesia Type:General  Level of Consciousness: drowsy, patient cooperative and responds to stimulation  Airway & Oxygen Therapy: Patient Spontanous Breathing and Patient connected to face mask oxygen  Post-op Assessment: Report given to RN and Post -op Vital signs reviewed and stable  Post vital signs: Reviewed and stable  Last Vitals:  Vitals Value Taken Time  BP 131/67 04/14/20 1200  Temp    Pulse 74 04/14/20 1204  Resp 15 04/14/20 1204  SpO2 98 % 04/14/20 1204  Vitals shown include unvalidated device data.  Last Pain:  Vitals:   04/14/20 0856  PainSc: 7       Patients Stated Pain Goal: 3 (12/81/18 8677)  Complications: No apparent anesthesia complications

## 2020-04-15 DIAGNOSIS — M5416 Radiculopathy, lumbar region: Secondary | ICD-10-CM | POA: Diagnosis not present

## 2020-04-15 LAB — GLUCOSE, CAPILLARY: Glucose-Capillary: 113 mg/dL — ABNORMAL HIGH (ref 70–99)

## 2020-04-15 MED ORDER — OXYCODONE-ACETAMINOPHEN 5-325 MG PO TABS: 1.0000 | ORAL_TABLET | ORAL | 0 refills | Status: DC | PRN

## 2020-04-15 MED ORDER — ASPIRIN-ACETAMINOPHEN-CAFFEINE 250-250-65 MG PO TABS: 2.0000 | ORAL_TABLET | Freq: Four times a day (QID) | ORAL | 0 refills | Status: DC | PRN

## 2020-04-15 MED ORDER — CYCLOBENZAPRINE HCL 10 MG PO TABS: 10.0000 mg | ORAL_TABLET | Freq: Three times a day (TID) | ORAL | 0 refills | Status: DC | PRN

## 2020-04-15 NOTE — Progress Notes (Signed)
Neurosurgery Service Progress Note  Subjective: No acute events overnight, distal leg pain improved, proximal buttock pain / back pain stable, numbness stable   Objective: Vitals:   04/14/20 1939 04/14/20 2320 04/15/20 0454 04/15/20 0719  BP: 121/67 (!) 100/54 115/65 111/72  Pulse: (!) 56 (!) 59 61 67  Resp: 18 18 18 16   Temp: 98.6 F (37 C) 98.1 F (36.7 C) 98.5 F (36.9 C) 98.6 F (37 C)  TempSrc: Oral Oral Oral Oral  SpO2: 91% 95% 91% 98%  Weight:      Height:       Temp (24hrs), Avg:98.7 F (37.1 C), Min:98.1 F (36.7 C), Max:98.9 F (37.2 C)  CBC Latest Ref Rng & Units 04/14/2020  WBC 4.0 - 10.5 K/uL 9.2  Hemoglobin 12.0 - 15.0 g/dL 15.3(H)  Hematocrit 36.0 - 46.0 % 48.9(H)  Platelets 150 - 400 K/uL 262   BMP Latest Ref Rng & Units 04/14/2020  Glucose 70 - 99 mg/dL 109(H)  BUN 8 - 23 mg/dL <5(L)  Creatinine 0.44 - 1.00 mg/dL 0.63  Sodium 135 - 145 mmol/L 142  Potassium 3.5 - 5.1 mmol/L 3.0(L)  Chloride 98 - 111 mmol/L 95(L)  CO2 22 - 32 mmol/L 35(H)  Calcium 8.9 - 10.3 mg/dL 9.9    Intake/Output Summary (Last 24 hours) at 04/15/2020 0918 Last data filed at 04/14/2020 1443 Gross per 24 hour  Intake 1020 ml  Output 50 ml  Net 970 ml    Current Facility-Administered Medications:  .  0.9 %  sodium chloride infusion, 250 mL, Intravenous, Continuous, Shawntrice Salle A, MD .  acetaminophen (TYLENOL) tablet 650 mg, 650 mg, Oral, Q4H PRN, 650 mg at 04/14/20 2344 **OR** acetaminophen (TYLENOL) suppository 650 mg, 650 mg, Rectal, Q4H PRN, Yanin Muhlestein A, MD .  albuterol (PROVENTIL) (2.5 MG/3ML) 0.083% nebulizer solution 3 mL, 3 mL, Inhalation, Q6H PRN, Judith Part, MD .  atorvastatin (LIPITOR) tablet 40 mg, 40 mg, Oral, Daily, Judith Part, MD, 40 mg at 04/15/20 0805 .  clonazePAM (KLONOPIN) tablet 0.5 mg, 0.5 mg, Oral, BID, Judith Part, MD, 0.5 mg at 04/15/20 0804 .  cyclobenzaprine (FLEXERIL) tablet 10 mg, 10 mg, Oral, TID PRN, Judith Part, MD, 10 mg at 04/15/20 0506 .  divalproex (DEPAKOTE ER) 24 hr tablet 250 mg, 250 mg, Oral, QHS, Alfa Leibensperger, Joyice Faster, MD, 250 mg at 04/14/20 2133 .  docusate sodium (COLACE) capsule 100 mg, 100 mg, Oral, BID, Judith Part, MD, 100 mg at 04/15/20 0805 .  hydrochlorothiazide (HYDRODIURIL) tablet 25 mg, 25 mg, Oral, Daily, Judith Part, MD, 25 mg at 04/15/20 0804 .  HYDROmorphone (DILAUDID) injection 1 mg, 1 mg, Intravenous, Q3H PRN, Judith Part, MD, 1 mg at 04/14/20 1950 .  lithium carbonate capsule 150 mg, 150 mg, Oral, BID, Judith Part, MD, 150 mg at 04/15/20 0849 .  losartan (COZAAR) tablet 25 mg, 25 mg, Oral, Daily, Judith Part, MD, 25 mg at 04/15/20 0805 .  menthol-cetylpyridinium (CEPACOL) lozenge 3 mg, 1 lozenge, Oral, PRN **OR** phenol (CHLORASEPTIC) mouth spray 1 spray, 1 spray, Mouth/Throat, PRN, Sigmond Patalano A, MD .  metFORMIN (GLUCOPHAGE) tablet 500 mg, 500 mg, Oral, BID WC, Saamir Armstrong, Joyice Faster, MD, 500 mg at 04/15/20 0751 .  multivitamin with minerals tablet 1 tablet, 1 tablet, Oral, Daily, Judith Part, MD, 1 tablet at 04/15/20 0804 .  ondansetron (ZOFRAN) tablet 4 mg, 4 mg, Oral, Q6H PRN **OR** ondansetron (ZOFRAN) injection 4 mg, 4 mg,  Intravenous, Q6H PRN, Judith Part, MD .  oxyCODONE (Oxy IR/ROXICODONE) immediate release tablet 10 mg, 10 mg, Oral, Q4H PRN, Judith Part, MD, 10 mg at 04/15/20 0508 .  oxyCODONE (Oxy IR/ROXICODONE) immediate release tablet 5 mg, 5 mg, Oral, Q4H PRN, Judith Part, MD, 5 mg at 04/14/20 2344 .  pantoprazole (PROTONIX) EC tablet 40 mg, 40 mg, Oral, QAC breakfast, Judith Part, MD, 40 mg at 04/15/20 0509 .  polyethylene glycol (MIRALAX / GLYCOLAX) packet 17 g, 17 g, Oral, Daily PRN, Judith Part, MD .  QUEtiapine (SEROQUEL) tablet 300 mg, 300 mg, Oral, QHS, Curtis Cain, Joyice Faster, MD, 300 mg at 04/14/20 2133 .  sodium chloride flush (NS) 0.9 % injection 3 mL, 3 mL,  Intravenous, Q12H, Ahnyla Mendel A, MD .  sodium chloride flush (NS) 0.9 % injection 3 mL, 3 mL, Intravenous, PRN, Judith Part, MD .  venlafaxine XR (EFFEXOR-XR) 24 hr capsule 225 mg, 225 mg, Oral, Daily, Shelvie Salsberry, Joyice Faster, MD, 225 mg at 04/15/20 4656   Physical Exam: Amublating with PT, normal gait  Assessment & Plan: 64 y.o. woman s/p MIS laminectomy, recovering well.  -discharge home  Judith Part  04/15/20 9:18 AM

## 2020-04-15 NOTE — Progress Notes (Signed)
Discharged instructions/education/AVS/Rx given to patient with husband at bedside and they both verbalized understanding. Patient ambulating well with with some complaints of pain on buttocks. Voiding and emptying bladder well. PAin is mild to moderate and controlled by prn pain medications. No swelling, no drainage, no redness noted on incision site. All questions answered appropriately and were both satisfied. Discharged via wheelchair.

## 2020-04-15 NOTE — Evaluation (Signed)
Physical Therapy Evaluation Patient Details Name: Vicki Blankenship MRN: [id number] DOB: [birth year] Today's Date: 04/15/2020   History of Present Illness  Pt is a 64 y/o female s/p L4-5 minimally invasive Left lumbar decompression. PMH includes bipolar disorder, COPD on 2L chronic, DM2, GERD, HTN, PTSD    Clinical Impression  Pt presented supine in bed with HOB elevated, awake and willing to participate in therapy session. Prior to admission, pt reported that she was independent with all functional mobility and ADLs. Pt lives with her husband in a single level home with three steps to enter. She will have 24/7 supervision/assistance upon d/c as needed. At the time of evaluation, pt demonstrating slight instability with ambulation, requiring min A x2 for LOB without use of an AD. Pt participated in stair training as well without difficulties. PT provided pt education re: back precautions with handout provided, car transfers and a generalized walking program for pt to initiate upon d/c home. No further acute PT needs identified at this time. PT signing off.     Follow Up Recommendations No PT follow up    Equipment Recommendations  None recommended by PT    Recommendations for Other Services       Precautions / Restrictions Precautions Precautions: Fall;Back Precaution Booklet Issued: Yes (comment) Precaution Comments: reviewed 3/3 back precautions with pt throughout Required Braces or Orthoses: ("no brace needed" MD order) Restrictions Weight Bearing Restrictions: No Other Position/Activity Restrictions: no brace needed      Mobility  Bed Mobility Overal bed mobility: Needs Assistance Bed Mobility: Rolling;Sidelying to Sit Rolling: Supervision Sidelying to sit: Supervision   Sit to supine: Supervision   General bed mobility comments: cueing for log roll technique  Transfers Overall transfer level: Needs assistance Equipment used: None Transfers: Sit to/from Stand Sit to Stand: Min  guard         General transfer comment: for safety, mildly unsteady  Ambulation/Gait Ambulation/Gait assistance: Min guard;Min assist Gait Distance (Feet): 500 Feet Assistive device: None Gait Pattern/deviations: Step-through pattern;Decreased stride length;Drifts right/left Gait velocity: decreased   General Gait Details: pt with mild instability with minor LOB x2 requiring light min A to recover  Stairs Stairs: Yes Stairs assistance: Min guard Stair Management: One rail Left;Step to pattern;Forwards Number of Stairs: 4 General stair comments: pt steady overall, no LOB or need for physical assistance  Wheelchair Mobility    Modified Rankin (Stroke Patients Only)       Balance Overall balance assessment: Needs assistance Sitting-balance support: Feet supported Sitting balance-Leahy Scale: Good     Standing balance support: During functional activity;No upper extremity supported Standing balance-Leahy Scale: Fair                               Pertinent Vitals/Pain Pain Assessment: Faces Faces Pain Scale: Hurts little more Pain Location: lumbar incisional site, R hip Pain Descriptors / Indicators: Aching;Sore Pain Intervention(s): Monitored during session;Repositioned    Home Living Family/patient expects to be discharged to:: Private residence Living Arrangements: Spouse/significant other Available Help at Discharge: Family;Available 24 hours/day Type of Home: House Home Access: Stairs to enter Entrance Stairs-Rails: Psychiatric nurse of Steps: 3 Home Layout: One level Home Equipment: Cane - single point Additional Comments: 2L O2 chronically    Prior Function Level of Independence: Independent               Hand Dominance        Extremity/Trunk Assessment  Upper Extremity Assessment Upper Extremity Assessment: Defer to OT evaluation;Overall WFL for tasks assessed    Lower Extremity Assessment Lower Extremity  Assessment: Overall WFL for tasks assessed    Cervical / Trunk Assessment Cervical / Trunk Assessment: Other exceptions Cervical / Trunk Exceptions: s/p lumbar sx  Communication   Communication: No difficulties  Cognition Arousal/Alertness: Awake/alert Behavior During Therapy: WFL for tasks assessed/performed Overall Cognitive Status: Within Functional Limits for tasks assessed                                        General Comments      Exercises     Assessment/Plan    PT Assessment Patent does not need any further PT services  PT Problem List         PT Treatment Interventions      PT Goals (Current goals can be found in the Care Plan section)  Acute Rehab PT Goals Patient Stated Goal: return to independence PT Goal Formulation: All assessment and education complete, DC therapy    Frequency     Barriers to discharge        Co-evaluation               AM-PAC PT "6 Clicks" Mobility  Outcome Measure Help needed turning from your back to your side while in a flat bed without using bedrails?: None Help needed moving from lying on your back to sitting on the side of a flat bed without using bedrails?: None Help needed moving to and from a bed to a chair (including a wheelchair)?: None Help needed standing up from a chair using your arms (e.g., wheelchair or bedside chair)?: None Help needed to walk in hospital room?: A Little Help needed climbing 3-5 steps with a railing? : A Little 6 Click Score: 22    End of Session   Activity Tolerance: Patient tolerated treatment well Patient left: Other (comment)(ambulating into room with OT) Nurse Communication: Mobility status PT Visit Diagnosis: Other abnormalities of gait and mobility (R26.89)    Time: 2458-0998 PT Time Calculation (min) (ACUTE ONLY): 20 min   Charges:   PT Evaluation $PT Eval Low Complexity: 1 Low          Eduard Clos, PT, DPT  Acute Rehabilitation Services Pager  [phone] Office [place] 04/15/2020, 10:10 AM

## 2020-04-15 NOTE — Discharge Instructions (Signed)
Discharge Instructions  No restriction in activities, slowly increase your activity back to normal.   Your incision is closed with dermabond (purple glue). This will naturally fall off over the next 1-2 weeks.   Okay to shower on the day of discharge. Use regular soap and water and try to be gentle when cleaning your incision.   Follow up with Dr. Zada Finders in 2 weeks after discharge. If you do not already have a discharge appointment, please call his office at [phone] to schedule a follow up appointment. If you have any concerns or questions, please call the office and let us know.

## 2020-04-15 NOTE — Evaluation (Signed)
Occupational Therapy Evaluation Patient Details Name: Vicki Blankenship MRN: [id number] DOB: [birth year] Today's Date: 04/15/2020    History of Present Illness 64 y/o female s/p L4-5 minimally invasive Left lumbar decompression. PMH includes bipolar disorder, COPD on 2L chronic, DM2, GERD, HTN, PTSD   Clinical Impression   PTA pt living with spouse, independent for BADL/IADL. At time of eval, pt completing BADL and functional mobility at mod I level. Back handout provided and reviewed adls in detail. Pt educated on: clothing between brace, never sleep in brace, set an alarm at night for medication, avoid sitting for long periods of time, correct bed positioning for sleeping, correct sequence for bed mobility, avoiding lifting more than 5 pounds and never wash directly over incision. All education is complete and patient indicates understanding. Educated pt on use of 3:1 for the shower and toilet at home for safe and independent transfers. No further OT needs identified, OT will sign off. Thank you for this consult.    Follow Up Recommendations  No OT follow up;Supervision - Intermittent    Equipment Recommendations  3 in 1 bedside commode    Recommendations for Other Services       Precautions / Restrictions Precautions Precautions: Fall;Back Precaution Booklet Issued: Yes (comment) Precaution Comments: reviewed in context of BADL Restrictions Weight Bearing Restrictions: No Other Position/Activity Restrictions: no brace needed      Mobility Bed Mobility Overal bed mobility: Needs Assistance Bed Mobility: Sit to Supine       Sit to supine: Supervision   General bed mobility comments: cues for safe technique  Transfers Overall transfer level: Needs assistance Equipment used: None Transfers: Sit to/from Stand Sit to Stand: Min guard         General transfer comment: for safety, mildly unsteady    Balance Overall balance assessment: Mild deficits observed, not formally  tested                                         ADL either performed or assessed with clinical judgement   ADL Overall ADL's : Modified independent                                       General ADL Comments: Pt demonstrated ability to complete BADL at mod I level. Pt demonstrated LB dressing in figure 4 position, toilet transfer, and functional mobility without external assist. Educated pt on use of 3:1 in shower as shower seat (has not been getting in shower due to fear of falling from prior fall) and as toilet riser. Reviewed maintaining spinal precautions with peri care, dressing, bathing, grooming,     Vision Patient Visual Report: No change from baseline       Perception     Praxis      Pertinent Vitals/Pain Pain Assessment: Faces Faces Pain Scale: Hurts little more Pain Location: lumbar incisional site, R hip Pain Descriptors / Indicators: Aching;Sore Pain Intervention(s): Monitored during session     Hand Dominance     Extremity/Trunk Assessment Upper Extremity Assessment Upper Extremity Assessment: Overall WFL for tasks assessed   Lower Extremity Assessment Lower Extremity Assessment: Defer to PT evaluation       Communication Communication Communication: No difficulties   Cognition Arousal/Alertness: Awake/alert Behavior During Therapy: WFL for tasks assessed/performed Overall Cognitive Status: Within Functional  Limits for tasks assessed                                     General Comments       Exercises     Shoulder Instructions      Home Living Family/patient expects to be discharged to:: Private residence Living Arrangements: Spouse/significant other Available Help at Discharge: Family;Available 24 hours/day Type of Home: House Home Access: Stairs to enter [employer] of Steps: 3 Entrance Stairs-Rails: Right;Left Home Layout: One level     Bathroom Shower/Tub: Animal nutritionist: Standard     Home Equipment: Cane - single point   Additional Comments: 2L O2 chronically      Prior Functioning/Environment Level of Independence: Independent                 OT Problem List: Decreased knowledge of use of DME or AE;Decreased knowledge of precautions;Cardiopulmonary status limiting activity;Pain      OT Treatment/Interventions:      OT Goals(Current goals can be found in the care plan section) Acute Rehab OT Goals Patient Stated Goal: return to independence OT Goal Formulation: All assessment and education complete, DC therapy  OT Frequency:     Barriers to D/C:            Co-evaluation              AM-PAC OT "6 Clicks" Daily Activity     Outcome Measure Help from another person eating meals?: None Help from another person taking care of personal grooming?: None Help from another person toileting, which includes using toliet, bedpan, or urinal?: None Help from another person bathing (including washing, rinsing, drying)?: None Help from another person to put on and taking off regular upper body clothing?: None Help from another person to put on and taking off regular lower body clothing?: None 6 Click Score: 24   End of Session Equipment Utilized During Treatment: Gait belt;Rolling walker Nurse Communication: Mobility status  Activity Tolerance: Patient tolerated treatment well Patient left: in bed;with call bell/phone within reach  OT Visit Diagnosis: Unsteadiness on feet (R26.81);Other abnormalities of gait and mobility (R26.89);Pain Pain - part of body: (back)                Time: 9242-6834 OT Time Calculation (min): 15 min Charges:  OT General Charges $OT Visit: 1 Visit OT Evaluation $OT Eval Low Complexity: 1 Low  Zenovia Jarred, MSOT, OTR/L Acute Rehabilitation Services [facility] Office Number: [phone] Pager: [phone]  Zenovia Jarred 04/15/2020, 9:37 AM

## 2020-04-15 NOTE — Discharge Summary (Signed)
Discharge Summary  Date of Admission: 04/14/2020  Date of Discharge: 04/15/20  Attending Physician: Emelda Brothers, MD  Hospital Course: Patient was admitted following an uncomplicated E0-0 MIS laminectomy. She was recovered in PACU and transferred to [facility]. Her hospital course was uncomplicated and the patient was discharged home on POD1. She will follow up in clinic with me in 2 weeks.  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

## 2020-08-24 ENCOUNTER — Other Ambulatory Visit: Payer: Self-pay | Admitting: Gastroenterology

## 2020-08-24 DIAGNOSIS — K219 Gastro-esophageal reflux disease without esophagitis: Secondary | ICD-10-CM

## 2020-09-08 ENCOUNTER — Encounter: Payer: Self-pay | Admitting: Orthopaedic Surgery

## 2020-09-08 ENCOUNTER — Ambulatory Visit: Payer: Medicare HMO

## 2020-09-08 ENCOUNTER — Ambulatory Visit (INDEPENDENT_AMBULATORY_CARE_PROVIDER_SITE_OTHER): Payer: Medicare HMO | Admitting: Orthopaedic Surgery

## 2020-09-08 ENCOUNTER — Other Ambulatory Visit: Payer: Self-pay

## 2020-09-08 VITALS — BP 121/74 | HR 80 | Ht 64.0 in | Wt 139.0 lb

## 2020-09-08 DIAGNOSIS — M25551 Pain in right hip: Secondary | ICD-10-CM | POA: Diagnosis not present

## 2020-09-08 DIAGNOSIS — G8929 Other chronic pain: Secondary | ICD-10-CM | POA: Diagnosis not present

## 2020-09-08 MED ORDER — OXYCODONE-ACETAMINOPHEN 7.5-325 MG PO TABS: 1.0000 | ORAL_TABLET | ORAL | 0 refills | Status: AC | PRN

## 2020-09-08 NOTE — Patient Instructions (Addendum)
MRI scan has been ordered for you, we will get approval for the scan if needed, then Forestine Na will call you with appointment.   Call Dr Zada Finders for appointment to follow up back and leg pain numbness

## 2020-09-08 NOTE — Progress Notes (Signed)
Patient ID:Vicki Blankenship, female DOB:Feb 06, 1956, 64 y.o. GEZ:[id number]  Chief Complaint  Patient presents with  . Hip Pain    Bilateral hip pain.    HPI  Vicki Blankenship is a 64 y.o. female who has right hip pain.  She had back surgery by Dr. Zada Finders in [place] for her lower back in May of this year.  Initially she did well but started having pain.  She was seen by pain management and had injection to the right hip last month.  She said the injection did not help.  She has pain running down the lower back to the right thigh, past the knee to the right foot.  She has no new trauma.  She is concerned that she is still in pain.  Dr. Zada Finders gave referral for hip evaluation.   I have reviewed his notes.  She says she cannot sleep well. She cannot walk well and she falls at times.  She says she is miserable.   Body mass index is 23.86 kg/m.  ROS  Review of Systems  Constitutional: Positive for activity change.  Musculoskeletal: Positive for arthralgias, back pain and myalgias.  Psychiatric/Behavioral: The patient is nervous/anxious.   All other systems reviewed and are negative.   All other systems reviewed and are negative.  The following is a summary of the past history medically, past history surgically, known current medicines, social history and family history.  This information is gathered electronically by the computer from prior information and documentation.  I review this each visit and have found including this information at this point in the chart is beneficial and informative.    Past Medical History:  Diagnosis Date  . Anxiety   . Arthritis   . Bipolar disorder ([place])   . COPD (chronic obstructive pulmonary disease) ([place])   . Depression   . Diabetes ([place])    Type II  . Dyspnea   . GERD (gastroesophageal reflux disease)   . Headache    migraines- 3 times a week  . HLD (hyperlipidemia)   . HTN (hypertension)   . Pneumonia   . PTSD (post-traumatic  stress disorder)     Past Surgical History:  Procedure Laterality Date  . CHOLECYSTECTOMY  2013  . COLONOSCOPY  2015   In [place].  Per patient, she had colon polyps.  . LUMBAR LAMINECTOMY/ DECOMPRESSION WITH MET-RX Left 04/14/2020   Procedure: Left Lumbar Four-Five Minimally invasive lumbar decompression;  Surgeon: Judith Part, MD;  Location: [facility];  Service: Neurosurgery;  Laterality: Left;  Left Lumbar Four-Five Minimally invasive lumbar decompression    Family History  Problem Relation Age of Onset  . Colon cancer Neg Hx     Social History Social History   Tobacco Use  . Smoking status: Current Every Day Smoker    Packs/day: 0.50    Years: 40.00    Pack years: 20.00    Types: Cigarettes  . Smokeless tobacco: Never Used  Vaping Use  . Vaping Use: Former  Substance Use Topics  . Alcohol use: Never  . Drug use: Never    Allergies  Allergen Reactions  . Penicillins     Tongue swelling  . Wellbutrin [Bupropion]     shakes    Current Outpatient Medications  Medication Sig Dispense Refill  . albuterol (VENTOLIN HFA) 108 (90 Base) MCG/ACT inhaler Inhale 2 puffs into the lungs every 6 (six) hours as needed for wheezing or shortness of breath.     Marland Kitchen aspirin  EC 81 MG tablet Take 81 mg by mouth daily.    Marland Kitchen aspirin-acetaminophen-caffeine (EXCEDRIN MIGRAINE) 250-250-65 MG tablet Take 2 tablets by mouth every 6 (six) hours as needed for headache. Restart on 04/21/20 30 tablet 0  . atorvastatin (LIPITOR) 40 MG tablet Take 40 mg by mouth daily.    . clonazePAM (KLONOPIN) 0.5 MG tablet Take 0.5 mg by mouth 2 (two) times daily.    . clonazePAM (KLONOPIN) 1 MG tablet Take 0.5 mg by mouth 2 (two) times daily.    . cyclobenzaprine (FLEXERIL) 10 MG tablet Take 1 tablet (10 mg total) by mouth 3 (three) times daily as needed for muscle spasms. 30 tablet 0  . divalproex (DEPAKOTE ER) 250 MG 24 hr tablet Take 250 mg by mouth at bedtime.    . hydrochlorothiazide (HYDRODIURIL) 25 MG  tablet Take 25 mg by mouth daily.    Marland Kitchen HYDROcodone-acetaminophen (NORCO) 10-325 MG tablet Take 1 tablet by mouth every 8 (eight) hours as needed. for pain    . lithium carbonate 150 MG capsule Take 150 mg by mouth 2 (two) times daily.     Marland Kitchen losartan (COZAAR) 25 MG tablet Take 25 mg by mouth daily.    . metFORMIN (GLUCOPHAGE) 500 MG tablet Take 500 mg by mouth 2 (two) times daily.    . Multiple Vitamin (MULTIVITAMIN) tablet Take 1 tablet by mouth daily.    . naloxone (NARCAN) 4 MG/0.1ML LIQD nasal spray kit Place 1 spray into the nose as needed (opioid overdose).    Marland Kitchen oxyCODONE-acetaminophen (PERCOCET) 7.5-325 MG tablet Take 1 tablet by mouth every 4 (four) hours as needed for up to 5 days for moderate pain. Five day limit consistent with [place] Stature. 30 tablet 0  . OXYGEN Inhale into the lungs. 2 liters at night    . pantoprazole (PROTONIX) 40 MG tablet TAKE 1 TABLET BY MOUTH ONCE DAILY BEFORE  BREAKFAST 90 tablet 3  . QUEtiapine (SEROQUEL) 300 MG tablet Take 300 mg by mouth at bedtime.    . Venlafaxine HCl 225 MG TB24 Take 225 mg by mouth daily.    . vitamin B-12 (CYANOCOBALAMIN) 1000 MCG tablet Take 1,000 mcg by mouth daily.     No current facility-administered medications for this visit.     Physical Exam  Blood pressure 121/74, pulse 80, height '5\' 4"'  (1.626 m), weight 139 lb (63 kg).  Constitutional: overall normal hygiene, normal nutrition, well developed, normal grooming, normal body habitus. Assistive device:none  Musculoskeletal: gait and station Limp none, muscle tone and strength are normal, no tremors or atrophy is present.  .  Neurological: coordination overall normal.  Deep tendon reflex/nerve stretch intact.  Sensation normal.  Cranial nerves II-XII intact.   Skin:   Normal overall no scars, lesions, ulcers or rashes. No psoriasis.  Psychiatric: Alert and oriented x 3.  Recent memory intact, remote memory unclear.  Normal mood and affect. Well groomed.  Good eye  contact.  Cardiovascular: overall no swelling, no varicosities, no edema bilaterally, normal temperatures of the legs and arms, no clubbing, cyanosis and good capillary refill.  Lymphatic: palpation is normal.  ROM of the right hip is good.  She has normal strength.  NV intact.  She has slight SI joint tenderness.   All other systems reviewed and are negative   The patient has been educated about the nature of the problem(s) and counseled on treatment options.  The patient appeared to understand what I have discussed and is in agreement  with it.  Encounter Diagnosis  Name Primary?  . Chronic right hip pain Yes   X-rays were done of the right hip, she has some degenerative changes.  I will get MRI of the right hip.   PLAN Call if any problems.  Precautions discussed.  Continue current medications.   Return to clinic 2 weeks   Get MRI of the right hip.  Contact Dr. Zada Finders again about the pain going to the foot.  I have reviewed the [place] web site prior to prescribing narcotic medicine for this patient.   Electronically Signed Sanjuana Kava, MD 10/5/202110:40 AM

## 2020-09-16 ENCOUNTER — Telehealth: Payer: Self-pay | Admitting: Orthopaedic Surgery

## 2020-09-16 NOTE — Telephone Encounter (Signed)
Patient called and left message stating she is in severe pain and needs pain medicine she thinks she has broken her ankle.  Then she said her tail bone.  There was a person in the back ground advising her what to say.  I called her back and advised her she will need to go to Urgent Care or ER for workup for these new medical issues per office policy . Phone was disconnected and she called back and hung up and called back and hung up.  She does not get good service.  Patient keeps loosing phone connection and is aware of this information.

## 2020-09-21 ENCOUNTER — Other Ambulatory Visit: Payer: Self-pay

## 2020-09-21 ENCOUNTER — Ambulatory Visit (HOSPITAL_COMMUNITY)
Admission: RE | Admit: 2020-09-21 | Discharge: 2020-09-21 | Disposition: A | Discharge status: Home / Self Care | Payer: Medicare HMO | Source: Ambulatory Visit | Attending: Orthopaedic Surgery | Admitting: Orthopaedic Surgery

## 2020-09-21 DIAGNOSIS — M25551 Pain in right hip: Secondary | ICD-10-CM | POA: Insufficient documentation

## 2020-09-21 DIAGNOSIS — G8929 Other chronic pain: Secondary | ICD-10-CM | POA: Diagnosis present

## 2020-09-24 ENCOUNTER — Ambulatory Visit: Payer: Medicare HMO | Admitting: Orthopaedic Surgery

## 2020-09-24 ENCOUNTER — Encounter: Payer: Self-pay | Admitting: Orthopaedic Surgery

## 2020-09-24 ENCOUNTER — Other Ambulatory Visit: Payer: Self-pay

## 2020-09-24 DIAGNOSIS — M545 Low back pain, unspecified: Secondary | ICD-10-CM

## 2020-09-24 DIAGNOSIS — S73191A Other sprain of right hip, initial encounter: Secondary | ICD-10-CM

## 2020-09-24 DIAGNOSIS — G8929 Other chronic pain: Secondary | ICD-10-CM

## 2020-09-24 NOTE — Progress Notes (Signed)
Patient ID:Vicki Blankenship, female DOB:Nov 06, 1956, 64 y.o. WLS:[id number]  Chief Complaint  Patient presents with  . Hip Pain    right hip pain, review MRI scan    HPI  Vicki Blankenship is a 64 y.o. female who has right hip pain.  She is also post recent lower back surgery by another physician.  MRI of the right hip shows: IMPRESSION: The examination is positive for a tear of the right anterior, superior labrum. Minimal hyaline cartilage loss is seen about the right hip.  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  I have explained the findings to her and her husband.  I will have her see Dr. Aretha Parrot at [facility] for further evaluation.  I have independently reviewed the MRI.     I spent about 30 minutes with them going over the findings. I have recommended they look this up on the [facility] web site.  They were shown pictures from it.    There is no height or weight on file to calculate BMI.  ROS  Review of Systems  Constitutional: Positive for activity change.  Musculoskeletal: Positive for arthralgias, back pain and myalgias.  Psychiatric/Behavioral: The patient is nervous/anxious.   All other systems reviewed and are negative.   All other systems reviewed and are negative.  The following is a summary of the past history medically, past history surgically, known current medicines, social history and family history.  This information is gathered electronically by the computer from prior information and documentation.  I review this each visit and have found including this information at this point in the chart is beneficial and informative.    Past Medical History:  Diagnosis Date  . Anxiety   . Arthritis   . Bipolar disorder ([place])   . COPD (chronic obstructive pulmonary disease) ([place])   . Depression   . Diabetes ([place])    Type II  . Dyspnea   . GERD (gastroesophageal reflux disease)   . Headache    migraines- 3 times a week  . HLD  (hyperlipidemia)   . HTN (hypertension)   . Pneumonia   . PTSD (post-traumatic stress disorder)     Past Surgical History:  Procedure Laterality Date  . CHOLECYSTECTOMY  2013  . COLONOSCOPY  2015   In [place].  Per patient, she had colon polyps.  . LUMBAR LAMINECTOMY/ DECOMPRESSION WITH MET-RX Left 04/14/2020   Procedure: Left Lumbar Four-Five Minimally invasive lumbar decompression;  Surgeon: Judith Part, MD;  Location: [place];  Service: Neurosurgery;  Laterality: Left;  Left Lumbar Four-Five Minimally invasive lumbar decompression    Family History  Problem Relation Age of Onset  . Colon cancer Neg Hx     Social History Social History   Tobacco Use  . Smoking status: Current Every Day Smoker    Packs/day: 0.50    Years: 40.00    Pack years: 20.00    Types: Cigarettes  . Smokeless tobacco: Never Used  Vaping Use  . Vaping Use: Former  Substance Use Topics  . Alcohol use: Never  . Drug use: Never    Allergies  Allergen Reactions  . Penicillins     Tongue swelling  . Wellbutrin [Bupropion]     shakes    Current Outpatient Medications  Medication Sig Dispense Refill  . albuterol (VENTOLIN HFA) 108 (90 Base) MCG/ACT inhaler Inhale 2 puffs into the lungs every 6 (six) hours as needed for wheezing or shortness of breath.     Marland Kitchen  aspirin EC 81 MG tablet Take 81 mg by mouth daily.    Marland Kitchen aspirin-acetaminophen-caffeine (EXCEDRIN MIGRAINE) 250-250-65 MG tablet Take 2 tablets by mouth every 6 (six) hours as needed for headache. Restart on 04/21/20 30 tablet 0  . atorvastatin (LIPITOR) 40 MG tablet Take 40 mg by mouth daily.    . clonazePAM (KLONOPIN) 0.5 MG tablet Take 0.5 mg by mouth 2 (two) times daily.    . clonazePAM (KLONOPIN) 1 MG tablet Take 0.5 mg by mouth 2 (two) times daily.    . cyclobenzaprine (FLEXERIL) 10 MG tablet Take 1 tablet (10 mg total) by mouth 3 (three) times daily as needed for muscle spasms. 30 tablet 0  . divalproex (DEPAKOTE ER) 250 MG 24 hr  tablet Take 250 mg by mouth at bedtime.    . hydrochlorothiazide (HYDRODIURIL) 25 MG tablet Take 25 mg by mouth daily.    Marland Kitchen HYDROcodone-acetaminophen (NORCO) 10-325 MG tablet Take 1 tablet by mouth every 8 (eight) hours as needed. for pain    . lithium carbonate 150 MG capsule Take 150 mg by mouth 2 (two) times daily.     Marland Kitchen losartan (COZAAR) 25 MG tablet Take 25 mg by mouth daily.    . metFORMIN (GLUCOPHAGE) 500 MG tablet Take 500 mg by mouth 2 (two) times daily.    . Multiple Vitamin (MULTIVITAMIN) tablet Take 1 tablet by mouth daily.    . naloxone (NARCAN) 4 MG/0.1ML LIQD nasal spray kit Place 1 spray into the nose as needed (opioid overdose).    . OXYGEN Inhale into the lungs. 2 liters at night    . pantoprazole (PROTONIX) 40 MG tablet TAKE 1 TABLET BY MOUTH ONCE DAILY BEFORE  BREAKFAST 90 tablet 3  . QUEtiapine (SEROQUEL) 300 MG tablet Take 300 mg by mouth at bedtime.    . Venlafaxine HCl 225 MG TB24 Take 225 mg by mouth daily.    . vitamin B-12 (CYANOCOBALAMIN) 1000 MCG tablet Take 1,000 mcg by mouth daily.     No current facility-administered medications for this visit.     Physical Exam  There were no vitals taken for this visit.  Constitutional: overall normal hygiene, normal nutrition, well developed, normal grooming, normal body habitus. Assistive device:none  Musculoskeletal: gait and station Limp right, muscle tone and strength are normal, no tremors or atrophy is present.  .  Neurological: coordination overall normal.  Deep tendon reflex/nerve stretch intact.  Sensation normal.  Cranial nerves II-XII intact.   Skin:   Normal overall no scars, lesions, ulcers or rashes. No psoriasis.  Psychiatric: Alert and oriented x 3.  Recent memory intact, remote memory unclear.  Normal mood and affect. Well groomed.  Good eye contact.  Cardiovascular: overall no swelling, no varicosities, no edema bilaterally, normal temperatures of the legs and arms, no clubbing, cyanosis and good  capillary refill.  Lymphatic: palpation is normal.  Right hip is tender but has good ROM.  She limps to the right.  She has pain of the lower back, SI joints and some of the left hip.  NV intact. All other systems reviewed and are negative   The patient has been educated about the nature of the problem(s) and counseled on treatment options.  The patient appeared to understand what I have discussed and is in agreement with it.  Encounter Diagnoses  Name Primary?  . Chronic right hip pain   . Lumbar pain   . Tear of right acetabular labrum, initial encounter Yes    PLAN  Call if any problems.  Precautions discussed.  Continue current medications.   Return to clinic To Dr. Aretha Parrot, [facility].   Electronically Signed Sanjuana Kava, MD 10/21/202110:41 AM

## 2020-10-06 ENCOUNTER — Telehealth: Payer: Self-pay | Admitting: Orthopaedic Surgery

## 2020-10-06 NOTE — Telephone Encounter (Signed)
Patient called to ask if something may be prescribed for her hip pain, which has been increasing, "especially today"; relays she is scheduled for appointment in [place] with Dr Aretha Parrot per referral by Dr Luna Glasgow, on 10/13/20, and for her nerve conduction study appointment at [facility] on 10/08/20. Please advise. If so, pharmacy is [facility], [place].

## 2020-10-06 NOTE — Telephone Encounter (Signed)
Called back to patient; notified; voiced understanding.

## 2020-10-06 NOTE — Telephone Encounter (Signed)
No, she was given 90 hydrocodone 10 on 09-18-20 by another doctor.

## 2021-02-09 ENCOUNTER — Other Ambulatory Visit (HOSPITAL_COMMUNITY): Payer: Self-pay | Admitting: Internal Medicine

## 2021-02-09 ENCOUNTER — Other Ambulatory Visit: Payer: Self-pay | Admitting: Internal Medicine

## 2021-02-09 DIAGNOSIS — F17200 Nicotine dependence, unspecified, uncomplicated: Secondary | ICD-10-CM

## 2021-02-10 ENCOUNTER — Other Ambulatory Visit (HOSPITAL_COMMUNITY): Payer: Self-pay | Admitting: Internal Medicine

## 2021-02-10 DIAGNOSIS — Z13820 Encounter for screening for osteoporosis: Secondary | ICD-10-CM

## 2021-02-16 ENCOUNTER — Other Ambulatory Visit: Payer: Self-pay

## 2021-02-16 ENCOUNTER — Ambulatory Visit (HOSPITAL_COMMUNITY)
Admission: RE | Admit: 2021-02-16 | Discharge: 2021-02-16 | Disposition: A | Discharge status: Home / Self Care | Payer: Medicare HMO | Source: Ambulatory Visit | Attending: Internal Medicine | Admitting: Internal Medicine

## 2021-02-16 DIAGNOSIS — F17200 Nicotine dependence, unspecified, uncomplicated: Secondary | ICD-10-CM | POA: Diagnosis not present

## 2021-02-16 DIAGNOSIS — Z13820 Encounter for screening for osteoporosis: Secondary | ICD-10-CM | POA: Insufficient documentation

## 2021-02-16 DIAGNOSIS — M85851 Other specified disorders of bone density and structure, right thigh: Secondary | ICD-10-CM | POA: Insufficient documentation

## 2021-02-16 DIAGNOSIS — Z780 Asymptomatic menopausal state: Secondary | ICD-10-CM | POA: Insufficient documentation

## 2021-02-22 ENCOUNTER — Encounter: Payer: Self-pay | Admitting: Internal Medicine

## 2021-03-09 ENCOUNTER — Ambulatory Visit (HOSPITAL_COMMUNITY)
Admission: RE | Admit: 2021-03-09 | Discharge: 2021-03-09 | Disposition: A | Discharge status: Home / Self Care | Payer: Medicare HMO | Source: Ambulatory Visit | Attending: Internal Medicine | Admitting: Internal Medicine

## 2021-03-09 DIAGNOSIS — Z122 Encounter for screening for malignant neoplasm of respiratory organs: Secondary | ICD-10-CM | POA: Insufficient documentation

## 2021-03-09 DIAGNOSIS — I700 Atherosclerosis of aorta: Secondary | ICD-10-CM | POA: Insufficient documentation

## 2021-03-09 DIAGNOSIS — F17200 Nicotine dependence, unspecified, uncomplicated: Secondary | ICD-10-CM

## 2021-03-09 DIAGNOSIS — J439 Emphysema, unspecified: Secondary | ICD-10-CM | POA: Diagnosis not present

## 2021-03-09 DIAGNOSIS — F17210 Nicotine dependence, cigarettes, uncomplicated: Secondary | ICD-10-CM | POA: Diagnosis not present

## 2021-03-22 ENCOUNTER — Telehealth: Payer: Self-pay

## 2021-03-22 NOTE — Telephone Encounter (Signed)
Patient was scheduled for office visit, I called her and got her rescheduled.

## 2021-03-22 NOTE — Telephone Encounter (Signed)
Pt called stating she received a letter about scheduling colonoscopy but she wants to change the date. Please advise the pt.

## 2021-03-22 NOTE — Telephone Encounter (Signed)
We didn't schedule patient for anything

## 2021-03-26 ENCOUNTER — Ambulatory Visit: Payer: Medicare HMO | Admitting: Internal Medicine

## 2021-04-16 ENCOUNTER — Ambulatory Visit: Payer: Medicare HMO | Admitting: Neurology

## 2021-05-07 ENCOUNTER — Ambulatory Visit: Payer: Medicare HMO | Admitting: Internal Medicine

## 2021-06-24 ENCOUNTER — Encounter: Payer: Self-pay | Admitting: Internal Medicine

## 2021-09-07 ENCOUNTER — Encounter: Payer: Self-pay | Admitting: Internal Medicine

## 2021-10-22 ENCOUNTER — Ambulatory Visit: Payer: Medicare HMO | Admitting: Gastroenterology

## 2021-12-05 DIAGNOSIS — R911 Solitary pulmonary nodule: Secondary | ICD-10-CM

## 2021-12-05 HISTORY — DX: Solitary pulmonary nodule: R91.1

## 2022-01-26 ENCOUNTER — Ambulatory Visit: Payer: Medicare HMO | Admitting: Gastroenterology

## 2022-01-26 ENCOUNTER — Encounter: Payer: Self-pay | Admitting: Internal Medicine

## 2022-03-01 ENCOUNTER — Other Ambulatory Visit (HOSPITAL_COMMUNITY): Payer: Self-pay | Admitting: Internal Medicine

## 2022-03-01 DIAGNOSIS — Z122 Encounter for screening for malignant neoplasm of respiratory organs: Secondary | ICD-10-CM

## 2022-03-01 LAB — LIPID PANEL
Cholesterol: 141 (ref 0–200)
HDL: 51 (ref 35–70)
LDL Cholesterol: 67
Triglycerides: 144 (ref 40–160)

## 2022-03-01 LAB — COMPREHENSIVE METABOLIC PANEL: Calcium: 10.8 — AB (ref 8.7–10.7)

## 2022-03-22 ENCOUNTER — Encounter: Payer: Self-pay | Admitting: Gastroenterology

## 2022-03-22 ENCOUNTER — Telehealth: Payer: Self-pay | Admitting: *Deleted

## 2022-03-22 ENCOUNTER — Telehealth: Payer: Self-pay

## 2022-03-22 ENCOUNTER — Telehealth (INDEPENDENT_AMBULATORY_CARE_PROVIDER_SITE_OTHER): Payer: Medicare HMO | Admitting: Gastroenterology

## 2022-03-22 VITALS — Ht 64.0 in | Wt 153.0 lb

## 2022-03-22 DIAGNOSIS — R1013 Epigastric pain: Secondary | ICD-10-CM | POA: Diagnosis not present

## 2022-03-22 DIAGNOSIS — Z86010 Personal history of colonic polyps: Secondary | ICD-10-CM

## 2022-03-22 DIAGNOSIS — K219 Gastro-esophageal reflux disease without esophagitis: Secondary | ICD-10-CM

## 2022-03-22 DIAGNOSIS — R198 Other specified symptoms and signs involving the digestive system and abdomen: Secondary | ICD-10-CM | POA: Diagnosis not present

## 2022-03-22 MED ORDER — PANTOPRAZOLE SODIUM 40 MG PO TBEC: 40.0000 mg | DELAYED_RELEASE_TABLET | Freq: Every day | ORAL | 3 refills | Status: DC

## 2022-03-22 NOTE — Patient Instructions (Signed)
Restart pantoprazole 40 mg daily.  Prescription sent to your pharmacy. ?We will schedule colonoscopy and upper endoscopy with possible dilation of your esophagus in the near future with Dr. Gala Romney or Dr. Abbey Chatters.  Per your request, we will look for soonest available appointment.  As soon as schedule is available, staff will call you to make arrangements.  If you have not heard from [place] in the next 1 to 2 weeks, please contact our office. ?I will review copy of labs done recently by Dr. Yong Channel.  If any additional labs needed, we will let you know. ?Please call if you see any further black or bloody stools.  Please call if you have any worsening shortness of breath, lightheadedness.  These can be signs of significant anemia. ?

## 2022-03-22 NOTE — Telephone Encounter (Signed)
Pt consented to a virtual visit. 

## 2022-03-22 NOTE — Telephone Encounter (Signed)
Tried to call pt to schedule TCS/EGD/DIL ASA 3, LMOVM for return call. ?

## 2022-03-22 NOTE — Telephone Encounter (Signed)
Vicki Blankenship, you are scheduled for a virtual visit with your provider today.  Just as we do with appointments in the office, we must obtain your consent to participate.  Your consent will be active for this visit and any virtual visit you may have with one of our providers in the next 365 days.  If you have a MyChart account, I can also send a copy of this consent to you electronically.  All virtual visits are billed to your insurance company just like a traditional visit in the office.  As this is a virtual visit, video technology does not allow for your provider to perform a traditional examination.  This may limit your provider's ability to fully assess your condition.  If your provider identifies any concerns that need to be evaluated in person or the need to arrange testing such as labs, EKG, etc, we will make arrangements to do so.  Although advances in technology are sophisticated, we cannot ensure that it will always work on either your end or our end.  If the connection with a video visit is poor, we may have to switch to a telephone visit.  With either a video or telephone visit, we are not always able to ensure that we have a secure connection.   I need to obtain your verbal consent now.   Are you willing to proceed with your visit today?  ?

## 2022-03-22 NOTE — Progress Notes (Signed)
? ? ? ? ?Primary Care Physician:  Abran Richard, MD ?Primary GI:  Garfield Cornea, MD, patient has not seen Rourk or Abbey Chatters and wants first available provider ?   ?Patient Location: Home ? ?Provider Location: [place] office ? ?Reason for Visit:  ?Chief Complaint  ?Patient presents with  ? Abdominal Pain  ?  Vicki Blankenship feels like reflux.   ? ? ? ?Persons present on the virtual encounter, with roles: Patient, myself (provider),Tammy Clifton CMA (updated meds and allergies) ? ?Total time (minutes) spent on medical discussion: 20 minutes ? ?Due to COVID-19, visit was conducted using Mychart video method.  Visit was requested by patient. ? ?Virtual Visit via Mychart video ? ?I connected with Vicki Blankenship on 03/22/22 at  9:00 AM EDT by Mychart video and verified that I am speaking with the correct person using two identifiers. ?  ?I discussed the limitations, risks, security and privacy concerns of performing an evaluation and management service by telephone/video and the availability of in person appointments. I also discussed with the patient that there may be a patient responsible charge related to this service. The patient expressed understanding and agreed to proceed. ? ? ?HPI:   ?Vicki Blankenship is a 66 y.o. female who presents for virtual visit regarding need for colonoscopy and upper endoscopy.  Received referral back in March 2022, requested by Dr. Yong Channel.  Patient's work-up delayed due to multiple canceled, missed appointments.  We last saw the patient back in 2020 with plans for colonoscopy and EGD, patient canceled her appointment until she was able to receive COVID vaccines.  She presents today wanting to continue work-up as previously recommended. ? ?Last colonoscopy in [place] about 6 years ago. Was having a lot of diarrhea at that time, had Cdiff. Three different antibiotics to get rid of diarrhea. When she had colonoscopy at that time, was also having major problems with throat, doesn't remember results.  She reports  history of colon polyps. ? ?Lately, bowel movements not daily.  Manages well with dietary fiber. Eats a lot of fruit which helps. If not eating a lot fiber than can go 3 days without a BM.  Does not take any medications to assist with constipation.  No melena. Some intermittent brbpr on the toilet tissues. Does not believe she has hemorrhoids. Last episode about a month ago. Little more bleeding than usual but after a few episodes slowed down. Sometimes black stools but does not take iron/pepto. Last episode about a month ago, few time.  ? ?Has been on pantoprazole daily since 2020. Reflux symptoms improved at that time.  She ran out of medication, states she ran out of refills.  Using something over-the-counter which is not as helpful.  Now having nocturnal symptoms, feels like going to regurgitate or vomit in sleep. Couple of occasions. Rare solid dysphagia. Was better before but noticed some problems more recently. Having to chew food thoroughly to make sure goes down well. Pain is from sternum to belly button, worse with certain foods.  This has been ongoing problem even when we saw her a couple years ago.  Seems to be worse with spicy/hot/acidic foods but she really tries to avoid. Has terrible anxiety and nervous stomach. Associated with bloating.  ? ?From a respiratory standpoint, she uses oxygen at night.  She had pneumonia back in November and required more oxygen around that time.  She has a pending pulmonology appointment.  Notes increased shortness of breath with vacuuming the floors.  When she  moves around the house quite a bit she gets short of breath.  No hospitalizations required in 1 years.  Continues to smoke cigarettes, currently about 10/day.  Cut down to 4/day but then with increased stress she started smoking more again. ?  ? ?Current Outpatient Medications  ?Medication Sig Dispense Refill  ? albuterol (VENTOLIN HFA) 108 (90 Base) MCG/ACT inhaler Inhale 2 puffs into the lungs every 6 (six)  hours as needed for wheezing or shortness of breath.     ? atorvastatin (LIPITOR) 40 MG tablet Take 40 mg by mouth daily.    ? clonazePAM (KLONOPIN) 0.5 MG tablet Take 0.5 mg by mouth 2 (two) times daily.    ? cyclobenzaprine (FLEXERIL) 10 MG tablet Take 1 tablet (10 mg total) by mouth 3 (three) times daily as needed for muscle spasms. 30 tablet 0  ? hydrochlorothiazide (HYDRODIURIL) 25 MG tablet Take 25 mg by mouth daily.    ? lithium carbonate 300 MG capsule Take 150 mg by mouth 2 (two) times daily.     ? losartan (COZAAR) 25 MG tablet Take 25 mg by mouth daily.    ? metFORMIN (GLUCOPHAGE) 500 MG tablet Take 500 mg by mouth 2 (two) times daily.    ? naloxone (NARCAN) 4 MG/0.1ML LIQD nasal spray kit Place 1 spray into the nose as needed (opioid overdose).    ? OXYGEN Inhale into the lungs. 2 liters at night    ? pantoprazole (PROTONIX) 40 MG tablet TAKE 1 TABLET BY MOUTH ONCE DAILY BEFORE  BREAKFAST 90 tablet 3  ? QUEtiapine (SEROQUEL) 300 MG tablet Take 300 mg by mouth at bedtime.    ? Venlafaxine HCl 225 MG TB24 Take 225 mg by mouth daily.    ? vitamin B-12 (CYANOCOBALAMIN) 1000 MCG tablet Take 1,000 mcg by mouth daily.    ? ?No current facility-administered medications for this visit.  ? ? ?Past Medical History:  ?Diagnosis Date  ? Anxiety   ? Arthritis   ? Bipolar disorder ([place])   ? COPD (chronic obstructive pulmonary disease) ([place])   ? Depression   ? Diabetes ([place])   ? Type II  ? Dyspnea   ? GERD (gastroesophageal reflux disease)   ? Headache   ? migraines- 3 times a week  ? HLD (hyperlipidemia)   ? HTN (hypertension)   ? Pneumonia   ? PTSD (post-traumatic stress disorder)   ? ? ?Past Surgical History:  ?Procedure Laterality Date  ? CHOLECYSTECTOMY  2013  ? COLONOSCOPY  2015  ? In [place].  Per patient, she had colon polyps.  ? LUMBAR LAMINECTOMY/ DECOMPRESSION WITH MET-RX Left 04/14/2020  ? Procedure: Left Lumbar Four-Five Minimally invasive lumbar decompression;  Surgeon: Judith Part, MD;  Location:  [place];  Service: Neurosurgery;  Laterality: Left;  Left Lumbar Four-Five Minimally invasive lumbar decompression  ? ? ?Family History  ?Problem Relation Age of Onset  ? Colon cancer Neg Hx   ? ? ?Social History  ? ?Socioeconomic History  ? Marital status: Married  ?  Spouse name: Not on file  ? Number of children: Not on file  ? Years of education: Not on file  ? Highest education level: Not on file  ?Occupational History  ? Not on file  ?Tobacco Use  ? Smoking status: Every Day  ?  Packs/day: 0.50  ?  Years: 40.00  ?  Pack years: 20.00  ?  Types: Cigarettes  ? Smokeless tobacco: Never  ? Tobacco comments:  ?  Smoking 10 cigarettes per day  ?Vaping Use  ? Vaping Use: Former  ?Substance and Sexual Activity  ? Alcohol use: Never  ? Drug use: Never  ? Sexual activity: Yes  ?Other Topics Concern  ? Not on file  ?Social History Narrative  ? Not on file  ? ?Social Determinants of Health  ? ?Financial Resource Strain: Not on file  ?Food Insecurity: Not on file  ?Transportation Needs: Not on file  ?Physical Activity: Not on file  ?Stress: Not on file  ?Social Connections: Not on file  ?Intimate Partner Violence: Not on file  ? ? ?  ?ROS: ? ?General: Negative for anorexia, unintentional weight loss, fever, chills, fatigue, weakness. ?Eyes: Negative for vision changes.  ?ENT: Negative for hoarseness, difficulty swallowing , nasal congestion. ?CV: Negative for chest pain, angina, palpitations, ++dyspnea on exertion, peripheral edema.  ?Respiratory: Negative for dyspnea at rest, ++dyspnea on exertion, cough, sputum, wheezing.  ?GI: See history of present illness. ?GU:  Negative for dysuria, hematuria, urinary incontinence, urinary frequency, nocturnal urination.  ?MS: Negative for joint pain, low back pain.  ?Derm: Negative for rash or itching.  ?Neuro: Negative for weakness, abnormal sensation, seizure, frequent headaches, memory loss, confusion.  ?Psych: Negative for  suicidal ideation, hallucinations.   +anxiety/depression ?Endo: Negative for unusual weight change.  ?Heme: Negative for bruising or bleeding. ?Allergy: Negative for rash or hives. ?  ?Observations/Objective: ?Requested labs. ? ?Appears older than stated age. Alert

## 2022-03-23 NOTE — Telephone Encounter (Signed)
Tried to call pt, LMOVM for return call. 

## 2022-03-24 NOTE — Telephone Encounter (Signed)
Letter mailed

## 2022-04-08 ENCOUNTER — Encounter: Payer: Self-pay | Admitting: "Endocrinology

## 2022-04-08 ENCOUNTER — Ambulatory Visit: Payer: Medicare HMO | Admitting: "Endocrinology

## 2022-04-08 DIAGNOSIS — M85851 Other specified disorders of bone density and structure, right thigh: Secondary | ICD-10-CM

## 2022-04-08 DIAGNOSIS — E559 Vitamin D deficiency, unspecified: Secondary | ICD-10-CM | POA: Diagnosis not present

## 2022-04-08 DIAGNOSIS — M85852 Other specified disorders of bone density and structure, left thigh: Secondary | ICD-10-CM

## 2022-04-08 DIAGNOSIS — F17210 Nicotine dependence, cigarettes, uncomplicated: Secondary | ICD-10-CM | POA: Insufficient documentation

## 2022-04-08 DIAGNOSIS — E119 Type 2 diabetes mellitus without complications: Secondary | ICD-10-CM | POA: Diagnosis not present

## 2022-04-08 DIAGNOSIS — R7303 Prediabetes: Secondary | ICD-10-CM | POA: Insufficient documentation

## 2022-04-08 DIAGNOSIS — F17200 Nicotine dependence, unspecified, uncomplicated: Secondary | ICD-10-CM

## 2022-04-08 LAB — BASIC METABOLIC PANEL
BUN: 10 (ref 4–21)
CO2: 31 — AB (ref 13–22)
Chloride: 102 (ref 99–108)
Creatinine: 0.8 (ref 0.5–1.1)
Glucose: 93
Potassium: 4.3 mEq/L (ref 3.5–5.1)
Sodium: 141 (ref 137–147)

## 2022-04-08 NOTE — Progress Notes (Signed)
?                                                          Endocrinology Consult Note  ?     04/08/2022, 12:03 PM ? ?Vicki Blankenship is a 66 y.o.-year-old female, referred by her  Vicki Richard, MD  , for evaluation for hypercalcemia/hyperparathyroidism. ? ? ?Past Medical History:  ?Diagnosis Date  ? Anxiety   ? Arthritis   ? Bipolar disorder ([place])   ? COPD (chronic obstructive pulmonary disease) ([place])   ? Depression   ? Diabetes ([place])   ? Type II  ? Diabetes mellitus, type II ([place])   ? Dyspnea   ? GERD (gastroesophageal reflux disease)   ? Headache   ? migraines- 3 times a week  ? HLD (hyperlipidemia)   ? HTN (hypertension)   ? Pneumonia   ? PTSD (post-traumatic stress disorder)   ? ? ?Past Surgical History:  ?Procedure Laterality Date  ? CHOLECYSTECTOMY  2013  ? COLONOSCOPY  2015  ? In [place].  Per patient, she had colon polyps.  ? FOOT SURGERY Left   ? LUMBAR LAMINECTOMY/ DECOMPRESSION WITH MET-RX Left 04/14/2020  ? Procedure: Left Lumbar Four-Five Minimally invasive lumbar decompression;  Surgeon: Judith Part, MD;  Location: [facility];  Service: Neurosurgery;  Laterality: Left;  Left Lumbar Four-Five Minimally invasive lumbar decompression  ? ? ?Social History  ? ?Tobacco Use  ? Smoking status: Every Day  ?  Packs/day: 0.50  ?  Years: 40.00  ?  Pack years: 20.00  ?  Types: Cigarettes  ? Smokeless tobacco: Never  ? Tobacco comments:  ?  Smoking 10 cigarettes per day  ?Vaping Use  ? Vaping Use: Former  ?Substance Use Topics  ? Alcohol use: Never  ? Drug use: Never  ? ? ?Family History  ?Problem Relation Age of Onset  ? Diabetes Mother   ? Cancer Father   ? Heart disease Father   ? Colon cancer Neg Hx   ? ? ?Outpatient Encounter Medications as of 04/08/2022  ?Medication Sig  ? Ascorbic Acid (VITAMIN C PO) Take 1 tablet by mouth daily.  ? Multiple Vitamin (MULTIVITAMIN ADULT PO) Take 1 tablet by mouth daily.  ? VITAMIN D, CHOLECALCIFEROL, PO Take 1 tablet by mouth daily.  ? [DISCONTINUED] nicotine (NICODERM CQ -  DOSED IN MG/24 HOURS) 14 mg/24hr patch 1 patch to skin  ? albuterol (VENTOLIN HFA) 108 (90 Base) MCG/ACT inhaler Inhale 2 puffs into the lungs every 6 (six) hours as needed for wheezing or shortness of breath.   ? aspirin 81 MG chewable tablet 1 tablet  ? atorvastatin (LIPITOR) 40 MG tablet Take 40 mg by mouth daily.  ? budesonide-formoterol (SYMBICORT) 160-4.5 MCG/ACT inhaler 2 puffs (Patient not taking: Reported on 04/08/2022)  ? clonazePAM (KLONOPIN) 0.5 MG tablet Take 0.5 mg by mouth 3 (three) times daily.  ? DULoxetine (CYMBALTA) 30 MG capsule Take 30 mg by mouth daily.  ? DULoxetine (CYMBALTA) 60 MG capsule Take 60 mg by mouth daily.  ? hydrochlorothiazide (HYDRODIURIL) 25 MG tablet Take 25 mg by mouth daily.  ? lithium carbonate 300 MG capsule Take 300 mg by mouth 2 (two) times daily.  ? losartan (COZAAR) 25 MG tablet Take 25 mg by mouth daily.  ? metFORMIN (GLUCOPHAGE)  500 MG tablet Take 500 mg by mouth daily with breakfast.  ? naloxone (NARCAN) 4 MG/0.1ML LIQD nasal spray kit Place 1 spray into the nose as needed (opioid overdose).  ? OXYGEN Inhale into the lungs. 2 liters at night  ? pantoprazole (PROTONIX) 40 MG tablet Take 1 tablet (40 mg total) by mouth daily before breakfast.  ? QUEtiapine (SEROQUEL) 400 MG tablet Take 400 mg by mouth at bedtime.  ? QUEtiapine (SEROQUEL) 50 MG tablet Take 50 mg by mouth every morning.  ? vitamin B-12 (CYANOCOBALAMIN) 1000 MCG tablet Take 1,000 mcg by mouth daily.  ? [DISCONTINUED] cyclobenzaprine (FLEXERIL) 10 MG tablet Take 1 tablet (10 mg total) by mouth 3 (three) times daily as needed for muscle spasms.  ? [DISCONTINUED] QUEtiapine (SEROQUEL) 300 MG tablet Take 300 mg by mouth at bedtime.  ? [DISCONTINUED] Venlafaxine HCl 225 MG TB24 Take 225 mg by mouth daily.  ? ?No facility-administered encounter medications on file as of 04/08/2022.  ? ? ?Allergies  ?Allergen Reactions  ? Penicillins   ?  Tongue swelling  ? Wellbutrin [Bupropion]   ?  shakes  ? ? ? ?HPI  ?Vicki Blankenship was diagnosed with hypercalcemia in recent couple of months.  Patient has no previously known history of parathyroid, pituitary, adrenal dysfunctions; no family history of such dysfunctions. ?She is accompanied by her husband to clinic.  History is obtained from the patient as well as chart review. ?-Review of herreferral package of most recent labs reveals calcium of 11.1 and 10.8 on 2 separate occasions.  She does not have PTH measurement.   ? ?-2022 bone density showed osteopenia of hips. ? ?No prior history of fragility fractures or falls.   ?She reports history of nephrolithiasis documented for few years and still has it.   ?No history of CKD.  ? ?she is  on HCTZ for blood pressure treatment.  She is on ongoing vitamin D supplement.   ?she is not on calcium supplements,  she eats dairy and green, leafy, vegetables on average amounts. ? ?she does not have a family history of hypercalcemia, pituitary tumors, thyroid cancer, or osteoporosis.  ? ?I reviewed her chart and she also has a history of type 2 diabetes now controlled, chronic heavy smoking with COPD, currently on bronchodilators, hyperlipidemia. ? ? ?ROS: ? ?Constitutional: + Aggressive weight gain, +fatigue, no subjective hyperthermia, no subjective hypothermia ?Eyes: no blurry vision, no xerophthalmia ?ENT: no sore throat, no nodules palpated in throat, no dysphagia/odynophagia, no hoarseness ?Cardiovascular: no Chest Pain, no Shortness of Breath, no palpitations, no leg swelling ?Respiratory: + cough, no shortness of breath  ?Gastrointestinal: no Nausea/Vomiting/Diarhhea ?Musculoskeletal: no muscle/joint aches ?Skin: no rashes ?Neurological: no tremors, no numbness, no tingling, no dizziness ?Psychiatric: no depression, no anxiety ? ?PE: ?BP 106/72   Pulse 80   Ht 5' 3.75" (1.619 m)   Wt 147 lb 9.6 oz (67 kg)   BMI 25.53 kg/m? , Body mass index is 25.53 kg/m?. ?Wt Readings from Last 3 Encounters:  ?04/08/22 147 lb 9.6 oz (67 kg)  ?03/22/22  153 lb (69.4 kg)  ?09/08/20 139 lb (63 kg)  ?  ?Constitutional: + BMI of 25.5 , not in acute distress, normal state of mind ?Eyes: PERRLA, EOMI, no exophthalmos ?ENT: moist mucous membranes, no gross thyromegaly, no gross cervical lymphadenopathy ?Cardiovascular: normal precordial activity, Regular Rate and Rhythm, no Murmur/Rubs/Gallops ?Respiratory:  adequate breathing efforts, no gross chest deformity, Clear to auscultation bilaterally ?Gastrointestinal: abdomen soft, Non -tender, No  distension, Bowel Sounds present ?Musculoskeletal: no gross deformities, strength intact in all four extremities ?Skin: moist, warm, no rashes ?Neurological: + tremor with outstretched hands, Deep tendon reflexes normal in bilateral lower extremities.   ? ? ?CMP ( most recent) ?CMP  ?   ?Component Value Date/Time  ? NA 141 03/01/2022 0000  ? K 4.3 03/01/2022 0000  ? CL 102 03/01/2022 0000  ? CO2 31 (A) 03/01/2022 0000  ? GLUCOSE 109 (H) 04/14/2020 4496  ? BUN 10 03/01/2022 0000  ? CREATININE 0.8 03/01/2022 0000  ? CREATININE 0.63 04/14/2020 0828  ? CALCIUM 10.8 (A) 03/01/2022 0000  ? GFRNONAA >60 04/14/2020 0828  ? GFRAA >60 04/14/2020 0828  ? ? ? ?Diabetic Labs (most recent): ?Lab Results  ?Component Value Date  ? HGBA1C 5.9 (H) 04/14/2020  ? ? ? Lipid Panel ( most recent) ?Lipid Panel  ?   ?Component Value Date/Time  ? CHOL 141 03/01/2022 0000  ? TRIG 144 03/01/2022 0000  ? HDL 51 03/01/2022 0000  ? [place] 67 03/01/2022 0000  ? ?  ? ? ?Assessment: ?1. Hypercalcemia  ?2.  Osteopenia ?3.  Vitamin D deficiency ? ? ?Plan: ?Patient has had at least 2  instances of elevated calcium, with the highest level being at 11.1 mg/dL.  ?No corresponding PTH was measured.   ?- Patient also  has vitamin D deficiency-on ongoing vitamin D supplement. ? ?-Apparent complications include osteopenia, nephrolithiasis.  Patient denies any history of fragility fracture, skeletal pain, seizure disorder, does not think she lost height. ? ? ?- I discussed  with the patient about the physiology of calcium and parathyroid hormone, and possible  effects of  increased PTH/ Calcium , including kidney stones, cardiac dysrhythmias, osteoporosis, abdominal pain, etc.  ? ?-

## 2022-04-13 LAB — CREATININE, URINE, 24 HOUR
Creatinine, 24H Ur: 602 mg/24 hr — ABNORMAL LOW (ref 800–1800)
Creatinine, Urine: 40.1 mg/dL

## 2022-04-14 LAB — CALCIUM, URINE, 24 HOUR
Calcium, 24H Urine: 68 mg/24 hr (ref 0–320)
Calcium, Urine: 4.5 mg/dL

## 2022-04-16 LAB — MAGNESIUM: Magnesium: 2.3 mg/dL (ref 1.6–2.3)

## 2022-04-16 LAB — PTH, INTACT AND CALCIUM
Calcium: 11.5 mg/dL — ABNORMAL HIGH (ref 8.7–10.3)
PTH: 36 pg/mL (ref 15–65)

## 2022-04-16 LAB — PTH-RELATED PEPTIDE: PTH-related peptide: <2 pmol/L

## 2022-04-16 LAB — PHOSPHORUS: Phosphorus: 3.3 mg/dL (ref 3.0–4.3)

## 2022-04-18 ENCOUNTER — Ambulatory Visit: Payer: Medicare HMO | Admitting: Internal Medicine

## 2022-04-18 ENCOUNTER — Encounter: Payer: Self-pay | Admitting: Internal Medicine

## 2022-04-18 ENCOUNTER — Telehealth: Payer: Self-pay | Admitting: Internal Medicine

## 2022-04-18 ENCOUNTER — Ambulatory Visit (HOSPITAL_COMMUNITY)
Admission: RE | Admit: 2022-04-18 | Discharge: 2022-04-18 | Disposition: A | Discharge status: Home / Self Care | Payer: Medicare HMO | Source: Ambulatory Visit | Attending: Internal Medicine | Admitting: Internal Medicine

## 2022-04-18 DIAGNOSIS — Z122 Encounter for screening for malignant neoplasm of respiratory organs: Secondary | ICD-10-CM | POA: Insufficient documentation

## 2022-04-18 DIAGNOSIS — J9612 Chronic respiratory failure with hypercapnia: Secondary | ICD-10-CM | POA: Diagnosis not present

## 2022-04-18 DIAGNOSIS — F17210 Nicotine dependence, cigarettes, uncomplicated: Secondary | ICD-10-CM | POA: Insufficient documentation

## 2022-04-18 DIAGNOSIS — J9611 Chronic respiratory failure with hypoxia: Secondary | ICD-10-CM | POA: Diagnosis not present

## 2022-04-18 DIAGNOSIS — J449 Chronic obstructive pulmonary disease, unspecified: Secondary | ICD-10-CM

## 2022-04-18 DIAGNOSIS — F17200 Nicotine dependence, unspecified, uncomplicated: Secondary | ICD-10-CM

## 2022-04-18 MED ORDER — BUDESONIDE-FORMOTEROL FUMARATE 80-4.5 MCG/ACT IN AERO: INHALATION_SPRAY | RESPIRATORY_TRACT | 12 refills | Status: DC

## 2022-04-18 MED ORDER — AZITHROMYCIN 250 MG PO TABS: ORAL_TABLET | ORAL | 0 refills | Status: DC

## 2022-04-18 NOTE — Patient Instructions (Addendum)
We will call to get your last pft from [place] if available  ? ?Plan A = Automatic = Always=    Symbicort  80 (for Breztri) Take 2 puffs first thing in am and then another 2 puffs about 12 hours later.  ?  ?Work on inhaler technique:  relax and gently blow all the way out then take a nice smooth full deep breath back in, triggering the inhaler at same time you start breathing in.  Hold for up to 5 seconds if you can. Blow out thru nose. Rinse and gargle with water when done.  If mouth or throat bother you at all,  try brushing gums/tongue with arm and hammer toothpaste/ make a slurry and gargle and spit out.  ? ?- take practice breaths like golfer's do before they hit the ball  ? ?Zpak should improve your cough  ? ?Plan B = Backup (to supplement plan A, not to replace it) ?Only use your albuterol inhaler as a rescue medication to be used if you can't catch your breath by resting or doing a relaxed purse lip breathing pattern.  ?- The less you use it, the better it will work when you need it. ?- Ok to use the inhaler up to 2 puffs  every 4 hours if you must but call for appointment if use goes up over your usual need ?- Don't leave home without it !!  (think of it like starter fluid  for your car)  ? ?  ?Make sure you check your oxygen saturation  AT  your highest level of activity (not after you stop)   to be sure it stays over 90% and adjust  02 flow upward to maintain this level if needed but remember to turn it back to previous settings when you stop (to conserve your supply).  ? ?Suggested e-cigs as an optional  "one way bridge"  Off all tobacco products  ? ?Please schedule a follow up visit in 3-4  months but call sooner if needed with pfts first  ?  ? ?

## 2022-04-18 NOTE — Progress Notes (Addendum)
Vicki Blankenship, female    DOB: 13-Sep-1956    MRN: [id number]   Brief patient profile:  25  yowf active smoker from [place]  grew up in house with bronchitis/ear infections referred to pulmonary clinic in [facility]  04/18/2022 by Dr Yong Channel.  Daily symptoms since 2011 placed on advair > thrush and since around 2012 just on albuterol and 02 hs since 2010    History of Present Illness  04/18/2022  Pulmonary/ 1st office eval/ Chaela Branscum / [place] [facility]  Chief Complaint  Patient presents with   Consult    Hx of COPD diagnosed around 2010.   Dyspnea:  [address] [employer]/ chest tightness walking to mailbox x years 200 ft uphill  to mailbox  Cough: worse since pna dec 2022 esp first thing worse x sev hours x brownish slt bloody mucus and epistaxis since jan 2023  Sleep: on 02 2lpm / flat bed 2 pillows   SABA use: once daily at most but very poor hfa 0 2  2lpm hs but not with activity   No obvious day to day or daytime variability or assoc  mucus plugs or   cp or chest tightness, subjective wheeze or overt   hb symptoms.   .Also denies any obvious fluctuation of symptoms with weather or environmental changes or other aggravating or alleviating factors except as outlined above   No unusual exposure hx or h/o childhood pna/ asthma or knowledge of premature birth.  Current Allergies, Complete Past Medical History, Past Surgical History, Family History, and Social History were reviewed in [employer] record.  ROS  The following are not active complaints unless bolded Hoarseness, sore throat, dysphagia, dental problems, itching, sneezing,  nasal congestion or discharge of excess mucus or purulent secretions, ear ache,   fever, chills, sweats, unintended wt loss or wt gain, classically pleuritic or exertional cp,  orthopnea pnd or arm/hand swelling  or leg swelling, presyncope, palpitations, abdominal pain, anorexia, nausea, vomiting, diarrhea  or change in bowel habits or change  in bladder habits, change in stools or change in urine, dysuria, hematuria,  rash, arthralgias, visual complaints, headache, numbness, weakness or ataxia or problems with walking or coordination,  change in mood or  memory.           Past Medical History:  Diagnosis Date   Anxiety    Arthritis    Bipolar disorder ([place])    COPD (chronic obstructive pulmonary disease) ([facility])    Depression    Diabetes ([facility])    Type II   Diabetes mellitus, type II ([facility])    Dyspnea    GERD (gastroesophageal reflux disease)    Headache    migraines- 3 times a week   HLD (hyperlipidemia)    HTN (hypertension)    Pneumonia    PTSD (post-traumatic stress disorder)     Outpatient Medications Prior to Visit  Medication Sig Dispense Refill   albuterol (VENTOLIN HFA) 108 (90 Base) MCG/ACT inhaler Inhale 2 puffs into the lungs every 6 (six) hours as needed for wheezing or shortness of breath.      aspirin 81 MG chewable tablet 1 tablet     atorvastatin (LIPITOR) 40 MG tablet Take 40 mg by mouth daily.     clonazePAM (KLONOPIN) 0.5 MG tablet Take 0.5 mg by mouth 3 (three) times daily.     DULoxetine (CYMBALTA) 30 MG capsule Take 30 mg by mouth daily.     DULoxetine (CYMBALTA) 60 MG capsule Take  60 mg by mouth daily.     hydrochlorothiazide (HYDRODIURIL) 25 MG tablet Take 25 mg by mouth daily.     lithium carbonate 300 MG capsule Take 600 mg by mouth daily.     losartan (COZAAR) 25 MG tablet Take 25 mg by mouth daily.     metFORMIN (GLUCOPHAGE) 500 MG tablet Take 500 mg by mouth daily with breakfast.     Multiple Vitamin (MULTIVITAMIN ADULT PO) Take 1 tablet by mouth daily.     naloxone (NARCAN) 4 MG/0.1ML LIQD nasal spray kit Place 1 spray into the nose as needed (opioid overdose).     OXYGEN Inhale into the lungs. 2 liters at night     pantoprazole (PROTONIX) 40 MG tablet Take 1 tablet (40 mg total) by mouth daily before breakfast. 90 tablet 3   QUEtiapine (SEROQUEL) 400 MG tablet Take 300 mg by mouth at  bedtime.     vitamin B-12 (CYANOCOBALAMIN) 1000 MCG tablet Take 1,000 mcg by mouth daily.     VITAMIN D, CHOLECALCIFEROL, PO Take 1 tablet by mouth daily.     Ascorbic Acid (VITAMIN C PO) Take 1 tablet by mouth daily.     budesonide-formoterol (SYMBICORT) 160-4.5 MCG/ACT inhaler 2 puffs (Patient not taking: Reported on 04/08/2022)     QUEtiapine (SEROQUEL) 50 MG tablet Take 50 mg by mouth every morning.     No facility-administered medications prior to visit.     Objective:     BP 130/78 (BP Location: Left Arm, Patient Position: Sitting)   Pulse 74   Temp 98.4 F (36.9 C) (Temporal)   Ht _0  (1.626 m)   Wt 146 lb 9.6 oz (66.5 kg)   SpO2 96% Comment: ra  BMI 25.16 kg/m   SpO2: 96 % (ra)  Amb pleasant wf, typical smoker's rattle  HEENT : Oropharynx slt thrush/ edentulous. Nasal turbinates mildedema/ min crusting   NECK :  without  appent JVD/ palpable Nodes/TM    LUNGS: no acc muscle use,  Mild barrel  contour chest wall with bilateral  Distant bs s audible wheeze and  without cough on insp or exp maneuvers  and mild  Hyperresonant  to  percussion bilaterally     CV:  RRR  no s3 or murmur or increase in P2, and no edema   ABD:  soft and nontender with pos end  insp Hoover's  in the supine position.  No bruits or organomegaly appreciated   MS:  Nl gait/ ext warm without deformities Or obvious joint restrictions  calf tenderness, cyanosis or clubbing     SKIN: warm and dry without lesions    NEURO:  alert, approp, nl sensorium with  no motor or cerebellar deficits apparent.        I personally reviewed images and agree with radiology impression as follows:   Chest LDSCT 04/18/2022   Assessment  GOLD 0/ active smoker - PFTs 06/04/15 no significant obstruction/ ERV 20% at wt 189 / nl dlco  - Holter 01/22/16 x 24 h  Nl but no symptoms reported during monitor - 04/18/2022  After extensive coaching inhaler device,  effectiveness =    50% > try symbicort 80 2bid due to  thrush on advair previously plus approp saba   Having somewhat of a flare assoc with rhinitis/ low grade epistaxis so rec zpak and mucine dm and for now just rx as AB pending return for pfts    Chronic respiratory failure with hypoxia and hypercapnia ([place]) HC03  On 03/01/22 =  31 but as high as 35 in 2001  -  04/18/2022   Walked on RA  x  3  lap(s) =  approx 450  ft  @ moderate pace, stopped due to end of study with lowest 02 sats 91% cc sob @ 3rd lap   rec 2lpm hs and advised:  Make sure you check your oxygen saturation  AT  your highest level of activity (not after you stop)   to be sure it stays over 90% and adjust  02 flow upward to maintain this level if needed but remember to turn it back to previous settings when you stop (to conserve your supply).    Current smoker 4-5 min discussion re active cigarette smoking in addition to office E&M  Ask about tobacco use:   Ongoing  Advise quitting   I took an extended  opportunity with this patient to outline the consequences of continued cigarette use  in airway disorders based on all the data we have from the multiple national lung health studies (perfomed over decades at millions of dollars in cost)  indicating that smoking cessation, not choice of inhalers or physicians, is the most important aspect of her care.   Assess willingness:  Not committed at this point Assist in quit attempt:  Suggested e-cigs as an optional  "one way bridge"  Off all tobacco products  Arrange follow up:   Follow up per Primary Care planned      Each maintenance medication was reviewed in detail including emphasizing most importantly the difference between maintenance and prns and under what circumstances the prns are to be triggered using an action plan format where appropriate.  Total time for H and P, chart review, counseling, reviewing hfa device(s) , directly observing portions of ambulatory 02 saturation study/ and generating customized AVS unique to this  office visit / same day charting => 45 min new pt eval                Christinia Gully, MD 04/18/2022

## 2022-04-18 NOTE — Assessment & Plan Note (Addendum)
Active smoker  - PFTs 06/04/15 no significant obstruction/ ERV 20% at wt 189 / nl dlco  - Holter 01/22/16 x 24 h  Nl but no symptoms reported during monitor - 04/18/2022  After extensive coaching inhaler device,  effectiveness =    50% > try symbicort 80 2bid due to thrush on advair previously plus approp saba   Having somewhat of a flare assoc with rhinitis/ low grade epistaxis so rec zpak and mucine dm and for now just rx as AB pending return for pfts

## 2022-04-18 NOTE — Assessment & Plan Note (Addendum)
HC03   On 03/01/22 =  31 but as high as 35 in 2001  ?-  04/18/2022   Walked on RA  x  3  lap(s) =  approx 450  ft  @ moderate pace, stopped due to end of study with lowest 02 sats 91% cc sob @ 3rd lap  ? ?rec 2lpm hs and advised:  Make sure you check your oxygen saturation  AT  your highest level of activity (not after you stop)   to be sure it stays over 90% and adjust  02 flow upward to maintain this level if needed but remember to turn it back to previous settings when you stop (to conserve your supply).  ? ? ?Each maintenance medication was reviewed in detail including emphasizing most importantly the difference between maintenance and prns and under what circumstances the prns are to be triggered using an action plan format where appropriate. ? ?Total time for H and P, chart review, counseling, reviewing hfa device(s) , directly observing portions of ambulatory 02 saturation study/ and generating customized AVS unique to this office visit / same day charting => 45 min new pt eval  ?      ?

## 2022-04-18 NOTE — Telephone Encounter (Signed)
Pt received a letter that we were unable to reach her by phone, She is needing to schedule tcs/egd. Please call [phone] ?

## 2022-04-18 NOTE — Assessment & Plan Note (Signed)
4-5 min discussion re active cigarette smoking in addition to office E&M ? ?Ask about tobacco use:   Ongoing  ?Advise quitting   I took an extended  opportunity with this patient to outline the consequences of continued cigarette use  in airway disorders based on all the data we have from the multiple national lung health studies (perfomed over decades at millions of dollars in cost)  indicating that smoking cessation, not choice of inhalers or physicians, is the most important aspect of her care.   ?Assess willingness:  Not committed at this point ?Assist in quit attempt:  Suggested e-cigs as an optional  "one way bridge"  Off all tobacco products  ?Arrange follow up:   Follow up per Primary Care planned  ?  ?  ? ?

## 2022-04-19 MED ORDER — PEG 3350-KCL-NA BICARB-NACL 420 G PO SOLR: 4000.0000 mL | ORAL | 0 refills | Status: DC

## 2022-04-19 NOTE — Telephone Encounter (Signed)
noted 

## 2022-04-19 NOTE — Telephone Encounter (Signed)
Called pt, TCS/EGD/DIL ASA 3 scheduled with Dr. Abbey Chatters. Rx for prep sent to pharmacy. Orders entered. ? ?FYI to Neil Crouch PA, procedure scheduled with Dr. Abbey Chatters. ?

## 2022-04-19 NOTE — Telephone Encounter (Signed)
Pre-op appt 05/19/22. Appt letter mailed with procedure instructions. ?

## 2022-04-22 ENCOUNTER — Ambulatory Visit: Payer: Medicare HMO | Admitting: "Endocrinology

## 2022-04-22 ENCOUNTER — Encounter: Payer: Self-pay | Admitting: "Endocrinology

## 2022-04-22 DIAGNOSIS — F17200 Nicotine dependence, unspecified, uncomplicated: Secondary | ICD-10-CM

## 2022-04-22 DIAGNOSIS — E119 Type 2 diabetes mellitus without complications: Secondary | ICD-10-CM | POA: Diagnosis not present

## 2022-04-22 DIAGNOSIS — E559 Vitamin D deficiency, unspecified: Secondary | ICD-10-CM

## 2022-04-22 DIAGNOSIS — M85851 Other specified disorders of bone density and structure, right thigh: Secondary | ICD-10-CM

## 2022-04-22 DIAGNOSIS — M85852 Other specified disorders of bone density and structure, left thigh: Secondary | ICD-10-CM

## 2022-04-22 NOTE — Progress Notes (Signed)
04/22/2022, 12:35 PM  Endocrinology follow-up note  Vicki Blankenship is a 66 y.o.-year-old female, referred by her  Vicki Richard, MD  , for evaluation for hypercalcemia/hyperparathyroidism.   Past Medical History:  Diagnosis Date   Anxiety    Arthritis    Bipolar disorder ([place])    COPD (chronic obstructive pulmonary disease) (HCC)    Depression    Diabetes ([place])    Type II   Diabetes mellitus, type II (HCC)    Dyspnea    GERD (gastroesophageal reflux disease)    Headache    migraines- 3 times a week   HLD (hyperlipidemia)    HTN (hypertension)    Pneumonia    PTSD (post-traumatic stress disorder)     Past Surgical History:  Procedure Laterality Date   CHOLECYSTECTOMY  2013   COLONOSCOPY  2015   In [place].  Per patient, she had colon polyps.   FOOT SURGERY Left    LUMBAR LAMINECTOMY/ DECOMPRESSION WITH MET-RX Left 04/14/2020   Procedure: Left Lumbar Four-Five Minimally invasive lumbar decompression;  Surgeon: Vicki Part, MD;  Location: [facility];  Service: Neurosurgery;  Laterality: Left;  Left Lumbar Four-Five Minimally invasive lumbar decompression    Social History   Tobacco Use   Smoking status: Every Day    Packs/day: 0.50    Years: 40.00    Pack years: 20.00    Types: Cigarettes   Smokeless tobacco: Never   Tobacco comments:    Smoking 10 cigarettes per day  Vaping Use   Vaping Use: Former  Substance Use Topics   Alcohol use: Never   Drug use: Never    Family History  Problem Relation Age of Onset   Diabetes Mother    Cancer Father    Heart disease Father    Colon cancer Neg Hx     Outpatient Encounter Medications as of 04/22/2022  Medication Sig   albuterol (VENTOLIN HFA) 108 (90 Base) MCG/ACT inhaler Inhale 2 puffs into the lungs every 6 (six) hours as needed for wheezing or shortness of breath.    aspirin 81 MG chewable tablet 1 tablet   atorvastatin (LIPITOR) 40 MG tablet Take 40 mg by  mouth daily.   azithromycin (ZITHROMAX) 250 MG tablet Take 2 on day one then 1 daily x 4 days   budesonide-formoterol (SYMBICORT) 80-4.5 MCG/ACT inhaler Take 2 puffs first thing in am and then another 2 puffs about 12 hours later.   clonazePAM (KLONOPIN) 0.5 MG tablet Take 0.5 mg by mouth 3 (three) times daily.   DULoxetine (CYMBALTA) 30 MG capsule Take 30 mg by mouth daily.   DULoxetine (CYMBALTA) 60 MG capsule Take 60 mg by mouth daily.   hydrochlorothiazide (HYDRODIURIL) 25 MG tablet Take 25 mg by mouth daily.   lithium carbonate 300 MG capsule Take 600 mg by mouth daily.   losartan (COZAAR) 25 MG tablet Take 25 mg by mouth daily.   metFORMIN (GLUCOPHAGE) 500 MG tablet Take 500 mg by mouth daily with breakfast.   Multiple Vitamin (MULTIVITAMIN ADULT PO) Take 1 tablet by mouth daily.   naloxone (NARCAN) 4 MG/0.1ML LIQD nasal spray kit Place 1 spray into the nose as needed (opioid overdose).   OXYGEN  Inhale into the lungs. 2 liters at night   pantoprazole (PROTONIX) 40 MG tablet Take 1 tablet (40 mg total) by mouth daily before breakfast.   polyethylene glycol-electrolytes (TRILYTE) 420 g solution Take 4,000 mLs by mouth as directed.   QUEtiapine (SEROQUEL) 400 MG tablet Take 300 mg by mouth at bedtime.   vitamin B-12 (CYANOCOBALAMIN) 1000 MCG tablet Take 1,000 mcg by mouth daily.   VITAMIN D, CHOLECALCIFEROL, PO Take 1 tablet by mouth daily.   No facility-administered encounter medications on file as of 04/22/2022.    Allergies  Allergen Reactions   Penicillins     Tongue swelling   Wellbutrin [Bupropion]     shakes     HPI  Vicki Blankenship was diagnosed with hypercalcemia in recent couple of months.  Patient has no previously known history of parathyroid, pituitary, adrenal dysfunctions; no family history of such dysfunctions. She was sent for more labs during her last visit. She is accompanied by her husband to clinic.  History is obtained from the patient as well as chart  review. -Review of herreferral package of most recent labs reveals calcium of 11.1 and 10.8 on 2 separate occasions.  Her repeat labs show calcium of 11.5, PTH 36.  Her 24-hour urine calcium was 68, however creatinine was 602 considered low.   -2022 bone density showed osteopenia of hips.  No prior history of fragility fractures or falls.   She reports history of nephrolithiasis documented for few years and still has it.   No history of CKD.   she is  on HCTZ for blood pressure treatment.  She is on ongoing vitamin D supplement.   she is not on calcium supplements,  she eats dairy and green, leafy, vegetables on average amounts.  she does not have a family history of hypercalcemia, pituitary tumors, thyroid cancer, or osteoporosis.   I reviewed her chart and she also has a history of type 2 diabetes now controlled, chronic heavy smoking with COPD, currently on bronchodilators, hyperlipidemia. She underwent CT chest, waiting for results.   ROS:  Constitutional: + Progressive weight gain, +fatigue, no subjective hyperthermia, no subjective hypothermia Eyes: no blurry vision, no xerophthalmia ENT: no sore throat, no nodules palpated in throat, no dysphagia/odynophagia, no hoarseness Cardiovascular: no Chest Pain, no Shortness of Breath, no palpitations, no leg swelling Respiratory: + cough, no shortness of breath  Gastrointestinal: no Nausea/Vomiting/Diarhhea Musculoskeletal: no muscle/joint aches Skin: no rashes Neurological: no tremors, no numbness, no tingling, no dizziness Psychiatric: no depression, no anxiety  PE: BP 116/64   Pulse 76   Ht _0  (1.626 m)   Wt 150 lb (68 kg)   BMI 25.75 kg/m , Body mass index is 25.75 kg/m. Wt Readings from Last 3 Encounters:  04/22/22 150 lb (68 kg)  04/18/22 146 lb 9.6 oz (66.5 kg)  04/08/22 147 lb 9.6 oz (67 kg)    Constitutional: + BMI of 25.5 , not in acute distress, normal state of mind Eyes: PERRLA, EOMI, no exophthalmos ENT:  moist mucous membranes, no gross thyromegaly, no gross cervical lymphadenopathy Cardiovascular: normal precordial activity, Regular Rate and Rhythm, no Murmur/Rubs/Gallops Respiratory:  adequate breathing efforts, no gross chest deformity, Clear to auscultation bilaterally Gastrointestinal: abdomen soft, Non -tender, No distension, Bowel Sounds present Musculoskeletal: no gross deformities, strength intact in all four extremities Skin: moist, warm, no rashes Neurological: + tremor with outstretched hands, Deep tendon reflexes normal in bilateral lower extremities.     CMP ( most recent) CMP  Component Value Date/Time   NA 141 03/01/2022 0000   K 4.3 03/01/2022 0000   CL 102 03/01/2022 0000   CO2 31 (A) 03/01/2022 0000   GLUCOSE 109 (H) 04/14/2020 0828   BUN 10 03/01/2022 0000   CREATININE 0.8 03/01/2022 0000   CREATININE 0.63 04/14/2020 0828   CALCIUM 11.5 (H) 04/08/2022 0927   GFRNONAA >60 04/14/2020 0828   GFRAA >60 04/14/2020 0828     Diabetic Labs (most recent): Lab Results  Component Value Date   HGBA1C 5.9 (H) 04/14/2020     Lipid Panel ( most recent) Lipid Panel     Component Value Date/Time   CHOL 141 03/01/2022 0000   TRIG 144 03/01/2022 0000   HDL 51 03/01/2022 0000   LDLCALC 67 03/01/2022 0000     Recent Results (from the past 2160 hour(s))  Basic metabolic panel     Status: Abnormal   Collection Time: 03/01/22 12:00 AM  Result Value Ref Range   Glucose 93    BUN 10 4 - 21   CO2 31 (A) 13 - 22   Creatinine 0.8 0.5 - 1.1   Potassium 4.3 3.5 - 5.1 mEq/L   Sodium 141 137 - 147   Chloride 102 99 - 108  Comprehensive metabolic panel     Status: Abnormal   Collection Time: 03/01/22 12:00 AM  Result Value Ref Range   Calcium 10.8 (A) 8.7 - 10.7  Lipid panel     Status: None   Collection Time: 03/01/22 12:00 AM  Result Value Ref Range   Triglycerides 144 40 - 160   Cholesterol 141 0 - 200   HDL 51 35 - 70   LDL Cholesterol 67   PTH, intact and  calcium     Status: Abnormal   Collection Time: 04/08/22  9:27 AM  Result Value Ref Range   Calcium 11.5 (H) 8.7 - 10.3 mg/dL   PTH 36 15 - 65 pg/mL   PTH Interp Comment     Comment: Interpretation                 Intact PTH    Calcium                                 (pg/mL)      (mg/dL) Normal                          15 - 65     8.6 - 10.2 Primary Hyperparathyroidism         >65          >10.2 Secondary Hyperparathyroidism       >65          <10.2 Non-Parathyroid Hypercalcemia       <65          >10.2 Hypoparathyroidism                  <15          < 8.6 Non-Parathyroid Hypocalcemia    15 - 65          < 8.6   Magnesium     Status: None   Collection Time: 04/08/22  9:27 AM  Result Value Ref Range   Magnesium 2.3 1.6 - 2.3 mg/dL  Phosphorus     Status: None   Collection Time: 04/08/22  9:27 AM  Result  Value Ref Range   Phosphorus 3.3 3.0 - 4.3 mg/dL  PTH-related peptide     Status: None   Collection Time: 04/08/22  9:27 AM  Result Value Ref Range   PTH-related peptide <2.0 pmol/L    Comment: This test was developed and its performance characteristics determined by [facility]. It has not been cleared or approved by the Food and Drug Administration. Reference Range: All Ages: <2.0 The PTHrP assay should not be used to exclude cancer or screen tumor patients for humoral hypercalcemia of malignancy (HHM). The results should always be assessed in conjunction with the patient's medical history, clinical examination, and other findings. If test results are clinically discordant, please contact the laboratory.   Creatinine, urine, 24 hour     Status: Abnormal   Collection Time: 04/12/22 10:30 AM  Result Value Ref Range   Creatinine, Urine 40.1 Not Estab. mg/dL   Creatinine, 24H Ur 602 (L) 800 - 1,800 mg/24 hr  Calcium, urine, 24 hour     Status: None   Collection Time: 04/12/22 10:33 AM  Result Value Ref Range   Calcium, Urine 4.5 Not Estab. mg/dL   Calcium, 24H Urine 68 0 -  320 mg/24 hr      Assessment: 1. Hypercalcemia  2.  Osteopenia 3.  Vitamin D deficiency   Plan: Patient has had at least 3  instances of elevated calcium, with the highest level being at 11.5 mg/dL.  -Corresponding 24-hour urine collection showed calcium of 68, PTH 36. - Patient also  has vitamin D deficiency-on ongoing vitamin D supplement.  -Apparent complications include osteopenia, nephrolithiasis.  Patient denies any history of fragility fracture, skeletal pain, seizure disorder, does not think she lost height. The fact that her 24-hour urine creatinine was low may mean that urine collection was not adequate.  She would not be sent for surgery until repeat labs including 24-hour urine calcium as well as PTH/calcium in 6 months.   - I discussed with the patient about the physiology of calcium and parathyroid hormone, and possible  effects of  increased PTH/ Calcium , including kidney stones, cardiac dysrhythmias, osteoporosis, abdominal pain, etc.   The is a chronic smoker,  PTH RP was undetectable indicating unlikely malignancy related hypercalcemia.    I reviewed her bone density study showing osteopenia of the hips.  If she is confirmed to have primary hyperparathyroidism, she will be considered for surgical treatment. She has well-controlled type 2 diabetes with A1c of 5.9%.  She is currently on metformin 500 mg p.o. daily.  She is on atorvastatin 40 mg p.o. nightly for hyperlipidemia.  Her major health risk is her smoking, which is likely behind her COPD. The patient was counseled on the dangers of tobacco use, and was advised to quit.  Reviewed strategies to maximize success, including removing cigarettes and smoking materials from environment.  She is advised to maintain close follow-up with her PCP.   I spent 32 minutes in the care of the patient today including review of labs from Thyroid Function, CMP, and other relevant labs ; imaging/biopsy records (current and  previous including abstractions from other facilities); face-to-face time discussing  her lab results and symptoms, medications doses, her options of short and long term treatment based on the latest standards of care / guidelines;   and documenting the encounter.  [name]  participated in the discussions, expressed understanding, and voiced agreement with the above plans.  All questions were answered to her satisfaction. she is encouraged to  contact clinic should she have any questions or concerns prior to her return visit.   - Return in about 6 months (around 10/23/2022) for 24 Hr Urine Ca & Cr, F/U with Pre-visit Labs.   Glade Lloyd, MD [facility] [address] [place], [place] [address] Phone: [phone]  Fax: [phone]    This note was partially dictated with voice recognition software. Similar sounding words can be transcribed inadequately or may not  be corrected upon review.  04/22/2022, 12:35 PM

## 2022-05-18 NOTE — Patient Instructions (Signed)
Vicki Blankenship [facility]  05/18/2022     @PREFPERIOPPHARMACY @   Your procedure is scheduled on  05/23/2022.   Report to [facility] at  0900 A.M.   Call this number if you have problems the morning of surgery:  [phone]   Remember:  Follow the diet and prep instructions given to you by the office.    Use your inhaler before you come and bring your rescue inhaler with you.     DO NOT take any medications for diabetes the morning of your procedure.    Take these medicines the morning of surgery with A SIP OF WATER         fioricet(if needed), clonazapam, lithium, protonix, seroquel.     Do not wear jewelry, make-up or nail polish.  Do not wear lotions, powders, or perfumes, or deodorant.  Do not shave 48 hours prior to surgery.  Men may shave face and neck.  Do not bring valuables to the hospital.  [facility] is not responsible for any belongings or valuables.  Contacts, dentures or bridgework may not be worn into surgery.  Leave your suitcase in the car.  After surgery it may be brought to your room.  For patients admitted to the hospital, discharge time will be determined by your treatment team.  Patients discharged the day of surgery will not be allowed to drive home and must have someone with them for 24 hours.    Special instructions:   DO NOT smoke tobacco or vape for 24 hours before your procedure.  Please read over the following fact sheets that you were given. Anesthesia Post-op Instructions and Care and Recovery After Surgery      Upper Endoscopy, Adult, Care After This sheet gives you information about how to care for yourself after your procedure. Your health care provider may also give you more specific instructions. If you have problems or questions, contact your health care provider. What can I expect after the procedure? After the procedure, it is common to have: A sore throat. Mild stomach pain or discomfort. Bloating. Nausea. Follow these  instructions at home:  Follow instructions from your health care provider about what to eat or drink after your procedure. Return to your normal activities as told by your health care provider. Ask your health care provider what activities are safe for you. Take over-the-counter and prescription medicines only as told by your health care provider. If you were given a sedative during the procedure, it can affect you for several hours. Do not drive or operate machinery until your health care provider says that it is safe. Keep all follow-up visits as told by your health care provider. This is important. Contact a health care provider if you have: A sore throat that lasts longer than one day. Trouble swallowing. Get help right away if: You vomit blood or your vomit looks like coffee grounds. You have: A fever. Bloody, black, or tarry stools. A severe sore throat or you cannot swallow. Difficulty breathing. Severe pain in your chest or abdomen. Summary After the procedure, it is common to have a sore throat, mild stomach discomfort, bloating, and nausea. If you were given a sedative during the procedure, it can affect you for several hours. Do not drive or operate machinery until your health care provider says that it is safe. Follow instructions from your health care provider about what to eat or drink after your procedure. Return to your normal  activities as told by your health care provider. This information is not intended to replace advice given to you by your health care provider. Make sure you discuss any questions you have with your health care provider. Document Revised: 09/27/2019 Document Reviewed: 04/23/2018 Elsevier Patient Education  [place]. Esophageal Dilatation Esophageal dilatation, also called esophageal dilation, is a procedure to widen or open a blocked or narrowed part of the esophagus. The esophagus is the part of the body that moves food and liquid from the  mouth to the stomach. You may need this procedure if: You have a buildup of scar tissue in your esophagus that makes it difficult, painful, or impossible to swallow. This can be caused by gastroesophageal reflux disease (GERD). You have cancer of the esophagus. There is a problem with how food moves through your esophagus. In some cases, you may need this procedure repeated at a later time to dilate the esophagus gradually. Tell a health care provider about: Any allergies you have. All medicines you are taking, including vitamins, herbs, eye drops, creams, and over-the-counter medicines. Any problems you or family members have had with anesthetic medicines. Any blood disorders you have. Any surgeries you have had. Any medical conditions you have. Any antibiotic medicines you are required to take before dental procedures. Whether you are pregnant or may be pregnant. What are the risks? Generally, this is a safe procedure. However, problems may occur, including: Bleeding due to a tear in the lining of the esophagus. A hole, or perforation, in the esophagus. What happens before the procedure? Ask your health care provider about: Changing or stopping your regular medicines. This is especially important if you are taking diabetes medicines or blood thinners. Taking medicines such as aspirin and ibuprofen. These medicines can thin your blood. Do not take these medicines unless your health care provider tells you to take them. Taking over-the-counter medicines, vitamins, herbs, and supplements. Follow instructions from your health care provider about eating or drinking restrictions. Plan to have a responsible adult take you home from the hospital or clinic. Plan to have a responsible adult care for you for the time you are told after you leave the hospital or clinic. This is important. What happens during the procedure? You may be given a medicine to help you relax (sedative). A numbing medicine  may be sprayed into the back of your throat, or you may gargle the medicine. Your health care provider may perform the dilatation using various surgical instruments, such as: Simple dilators. This instrument is carefully placed in the esophagus to stretch it. Guided wire bougies. This involves using an endoscope to insert a wire into the esophagus. A dilator is passed over this wire to enlarge the esophagus. Then the wire is removed. Balloon dilators. An endoscope with a small balloon is inserted into the esophagus. The balloon is inflated to stretch the esophagus and open it up. The procedure may vary among health care providers and hospitals. What can I expect after the procedure? Your blood pressure, heart rate, breathing rate, and blood oxygen level will be monitored until you leave the hospital or clinic. Your throat may feel slightly sore and numb. This will get better over time. You will not be allowed to eat or drink until your throat is no longer numb. When you are able to drink, urinate, and sit on the edge of the bed without nausea or dizziness, you may be able to return home. Follow these instructions at home: Take over-the-counter and  prescription medicines only as told by your health care provider. If you were given a sedative during the procedure, it can affect you for several hours. Do not drive or operate machinery until your health care provider says that it is safe. Plan to have a responsible adult care for you for the time you are told. This is important. Follow instructions from your health care provider about any eating or drinking restrictions. Do not use any products that contain nicotine or tobacco, such as cigarettes, e-cigarettes, and chewing tobacco. If you need help quitting, ask your health care provider. Keep all follow-up visits. This is important. Contact a health care provider if: You have a fever. You have pain that is not relieved by medicine. Get help right  away if: You have chest pain. You have trouble breathing. You have trouble swallowing. You vomit blood. You have black, tarry, or bloody stools. These symptoms may represent a serious problem that is an emergency. Do not wait to see if the symptoms will go away. Get medical help right away. Call your local emergency services (911 in the [place] Do not drive yourself to the hospital. Summary Esophageal dilatation, also called esophageal dilation, is a procedure to widen or open a blocked or narrowed part of the esophagus. Plan to have a responsible adult take you home from the hospital or clinic. For this procedure, a numbing medicine may be sprayed into the back of your throat, or you may gargle the medicine. Do not drive or operate machinery until your health care provider says that it is safe. This information is not intended to replace advice given to you by your health care provider. Make sure you discuss any questions you have with your health care provider. Document Revised: 04/08/2020 Document Reviewed: 04/08/2020 Elsevier Patient Education  [place]. Colonoscopy, Adult, Care After The following information offers guidance on how to care for yourself after your procedure. Your health care provider may also give you more specific instructions. If you have problems or questions, contact your health care provider. What can I expect after the procedure? After the procedure, it is common to have: A small amount of blood in your stool for 24 hours after the procedure. Some gas. Mild cramping or bloating of your abdomen. Follow these instructions at home: Eating and drinking  Drink enough fluid to keep your urine pale yellow. Follow instructions from your health care provider about eating or drinking restrictions. Resume your normal diet as told by your health care provider. Avoid heavy or fried foods that are hard to digest. Activity Rest as told by your health care  provider. Avoid sitting for a long time without moving. Get up to take short walks every 1-2 hours. This is important to improve blood flow and breathing. Ask for help if you feel weak or unsteady. Return to your normal activities as told by your health care provider. Ask your health care provider what activities are safe for you. Managing cramping and bloating  Try walking around when you have cramps or feel bloated. If directed, apply heat to your abdomen as told by your health care provider. Use the heat source that your health care provider recommends, such as a moist heat pack or a heating pad. Place a towel between your skin and the heat source. Leave the heat on for 20-30 minutes. Remove the heat if your skin turns bright red. This is especially important if you are unable to feel pain, heat, or cold. You have  a greater risk of getting burned. General instructions If you were given a sedative during the procedure, it can affect you for several hours. Do not drive or operate machinery until your health care provider says that it is safe. For the first 24 hours after the procedure: Do not sign important documents. Do not drink alcohol. Do your regular daily activities at a slower pace than normal. Eat soft foods that are easy to digest. Take over-the-counter and prescription medicines only as told by your health care provider. Keep all follow-up visits. This is important. Contact a health care provider if: You have blood in your stool 2-3 days after the procedure. Get help right away if: You have more than a small spotting of blood in your stool. You have large blood clots in your stool. You have swelling of your abdomen. You have nausea or vomiting. You have a fever. You have increasing pain in your abdomen that is not relieved with medicine. These symptoms may be an emergency. Get help right away. Call 911. Do not wait to see if the symptoms will go away. Do not drive yourself to  the hospital. Summary After the procedure, it is common to have a small amount of blood in your stool. You may also have mild cramping and bloating of your abdomen. If you were given a sedative during the procedure, it can affect you for several hours. Do not drive or operate machinery until your health care provider says that it is safe. Get help right away if you have a lot of blood in your stool, nausea or vomiting, a fever, or increased pain in your abdomen. This information is not intended to replace advice given to you by your health care provider. Make sure you discuss any questions you have with your health care provider. Document Revised: 07/14/2021 Document Reviewed: 07/14/2021 Elsevier Patient Education  [place] After This sheet gives you information about how to care for yourself after your procedure. Your health care provider may also give you more specific instructions. If you have problems or questions, contact your health care provider. What can I expect after the procedure? After the procedure, it is common to have: Tiredness. Forgetfulness about what happened after the procedure. Impaired judgment for important decisions. Nausea or vomiting. Some difficulty with balance. Follow these instructions at home: For the time period you were told by your health care provider:     Rest as needed. Do not participate in activities where you could fall or become injured. Do not drive or use machinery. Do not drink alcohol. Do not take sleeping pills or medicines that cause drowsiness. Do not make important decisions or sign legal documents. Do not take care of children on your own. Eating and drinking Follow the diet that is recommended by your health care provider. Drink enough fluid to keep your urine pale yellow. If you vomit: Drink water, juice, or soup when you can drink without vomiting. Make sure you have little or no nausea  before eating solid foods. General instructions Have a responsible adult stay with you for the time you are told. It is important to have someone help care for you until you are awake and alert. Take over-the-counter and prescription medicines only as told by your health care provider. If you have sleep apnea, surgery and certain medicines can increase your risk for breathing problems. Follow instructions from your health care provider about wearing your sleep device: Anytime you are sleeping,  including during daytime naps. While taking prescription pain medicines, sleeping medicines, or medicines that make you drowsy. Avoid smoking. Keep all follow-up visits as told by your health care provider. This is important. Contact a health care provider if: You keep feeling nauseous or you keep vomiting. You feel light-headed. You are still sleepy or having trouble with balance after 24 hours. You develop a rash. You have a fever. You have redness or swelling around the IV site. Get help right away if: You have trouble breathing. You have new-onset confusion at home. Summary For several hours after your procedure, you may feel tired. You may also be forgetful and have poor judgment. Have a responsible adult stay with you for the time you are told. It is important to have someone help care for you until you are awake and alert. Rest as told. Do not drive or operate machinery. Do not drink alcohol or take sleeping pills. Get help right away if you have trouble breathing, or if you suddenly become confused. This information is not intended to replace advice given to you by your health care provider. Make sure you discuss any questions you have with your health care provider. Document Revised: 10/26/2021 Document Reviewed: 10/24/2019 Elsevier Patient Education  [place].

## 2022-05-19 ENCOUNTER — Encounter (HOSPITAL_COMMUNITY): Payer: Self-pay

## 2022-05-19 ENCOUNTER — Encounter (HOSPITAL_COMMUNITY)
Admission: RE | Admit: 2022-05-19 | Discharge: 2022-05-19 | Disposition: A | Discharge status: Home / Self Care | Payer: Medicare HMO | Source: Ambulatory Visit | Attending: Internal Medicine | Admitting: Internal Medicine

## 2022-05-19 VITALS — BP 144/62 | HR 68 | Temp 97.8°F | Resp 18 | Ht 64.0 in | Wt 149.9 lb

## 2022-05-19 DIAGNOSIS — E119 Type 2 diabetes mellitus without complications: Secondary | ICD-10-CM

## 2022-05-19 DIAGNOSIS — I10 Essential (primary) hypertension: Secondary | ICD-10-CM

## 2022-05-19 DIAGNOSIS — Z01818 Encounter for other preprocedural examination: Secondary | ICD-10-CM | POA: Diagnosis present

## 2022-05-19 LAB — BASIC METABOLIC PANEL
Anion gap: 7 (ref 5–15)
BUN: 7 mg/dL — ABNORMAL LOW (ref 8–23)
CO2: 29 mmol/L (ref 22–32)
Calcium: 10.2 mg/dL (ref 8.9–10.3)
Chloride: 102 mmol/L (ref 98–111)
Creatinine, Ser: 0.74 mg/dL (ref 0.44–1.00)
GFR, Estimated: >60 mL/min (ref >60)
Glucose, Bld: 90 mg/dL (ref 70–99)
Potassium: 3.4 mmol/L — ABNORMAL LOW (ref 3.5–5.1)
Sodium: 138 mmol/L (ref 135–145)

## 2022-05-23 ENCOUNTER — Ambulatory Visit (HOSPITAL_COMMUNITY)
Admission: RE | Admit: 2022-05-23 | Discharge: 2022-05-23 | Disposition: A | Discharge status: Home / Self Care | Payer: Medicare HMO | Attending: Internal Medicine | Admitting: Internal Medicine

## 2022-05-23 ENCOUNTER — Encounter (HOSPITAL_COMMUNITY)
Admission: RE | Disposition: A | Discharge status: Home / Self Care | Payer: Self-pay | Source: Home / Self Care | Attending: Internal Medicine

## 2022-05-23 ENCOUNTER — Ambulatory Visit (HOSPITAL_BASED_OUTPATIENT_CLINIC_OR_DEPARTMENT_OTHER): Payer: Medicare HMO | Admitting: Anesthesiology

## 2022-05-23 ENCOUNTER — Ambulatory Visit (HOSPITAL_COMMUNITY): Payer: Medicare HMO | Admitting: Anesthesiology

## 2022-05-23 ENCOUNTER — Encounter (HOSPITAL_COMMUNITY): Payer: Self-pay

## 2022-05-23 ENCOUNTER — Encounter: Payer: Self-pay | Admitting: Internal Medicine

## 2022-05-23 ENCOUNTER — Telehealth: Payer: Self-pay

## 2022-05-23 DIAGNOSIS — K2970 Gastritis, unspecified, without bleeding: Secondary | ICD-10-CM | POA: Diagnosis not present

## 2022-05-23 DIAGNOSIS — R1310 Dysphagia, unspecified: Secondary | ICD-10-CM | POA: Insufficient documentation

## 2022-05-23 DIAGNOSIS — R1013 Epigastric pain: Secondary | ICD-10-CM

## 2022-05-23 DIAGNOSIS — M85851 Other specified disorders of bone density and structure, right thigh: Secondary | ICD-10-CM

## 2022-05-23 DIAGNOSIS — I2510 Atherosclerotic heart disease of native coronary artery without angina pectoris: Secondary | ICD-10-CM | POA: Insufficient documentation

## 2022-05-23 DIAGNOSIS — J439 Emphysema, unspecified: Secondary | ICD-10-CM | POA: Insufficient documentation

## 2022-05-23 DIAGNOSIS — M5416 Radiculopathy, lumbar region: Secondary | ICD-10-CM

## 2022-05-23 DIAGNOSIS — R918 Other nonspecific abnormal finding of lung field: Secondary | ICD-10-CM | POA: Insufficient documentation

## 2022-05-23 DIAGNOSIS — K625 Hemorrhage of anus and rectum: Secondary | ICD-10-CM | POA: Diagnosis not present

## 2022-05-23 DIAGNOSIS — Z79899 Other long term (current) drug therapy: Secondary | ICD-10-CM | POA: Diagnosis not present

## 2022-05-23 DIAGNOSIS — F17200 Nicotine dependence, unspecified, uncomplicated: Secondary | ICD-10-CM | POA: Diagnosis not present

## 2022-05-23 DIAGNOSIS — K648 Other hemorrhoids: Secondary | ICD-10-CM

## 2022-05-23 DIAGNOSIS — I700 Atherosclerosis of aorta: Secondary | ICD-10-CM | POA: Diagnosis not present

## 2022-05-23 DIAGNOSIS — E559 Vitamin D deficiency, unspecified: Secondary | ICD-10-CM

## 2022-05-23 DIAGNOSIS — K2289 Other specified disease of esophagus: Secondary | ICD-10-CM | POA: Diagnosis not present

## 2022-05-23 DIAGNOSIS — J449 Chronic obstructive pulmonary disease, unspecified: Secondary | ICD-10-CM

## 2022-05-23 DIAGNOSIS — K5730 Diverticulosis of large intestine without perforation or abscess without bleeding: Secondary | ICD-10-CM | POA: Diagnosis not present

## 2022-05-23 DIAGNOSIS — E119 Type 2 diabetes mellitus without complications: Secondary | ICD-10-CM

## 2022-05-23 DIAGNOSIS — K2950 Unspecified chronic gastritis without bleeding: Secondary | ICD-10-CM | POA: Diagnosis not present

## 2022-05-23 DIAGNOSIS — K635 Polyp of colon: Secondary | ICD-10-CM | POA: Insufficient documentation

## 2022-05-23 DIAGNOSIS — Z86010 Personal history of colonic polyps: Secondary | ICD-10-CM

## 2022-05-23 DIAGNOSIS — K219 Gastro-esophageal reflux disease without esophagitis: Secondary | ICD-10-CM

## 2022-05-23 DIAGNOSIS — R198 Other specified symptoms and signs involving the digestive system and abdomen: Secondary | ICD-10-CM

## 2022-05-23 DIAGNOSIS — R7303 Prediabetes: Secondary | ICD-10-CM

## 2022-05-23 DIAGNOSIS — I10 Essential (primary) hypertension: Secondary | ICD-10-CM | POA: Insufficient documentation

## 2022-05-23 DIAGNOSIS — J9611 Chronic respiratory failure with hypoxia: Secondary | ICD-10-CM

## 2022-05-23 LAB — GLUCOSE, CAPILLARY: Glucose-Capillary: 86 mg/dL (ref 70–99)

## 2022-05-23 SURGERY — COLONOSCOPY WITH PROPOFOL
Anesthesia: General

## 2022-05-23 MED ORDER — DEXTROSE 50 % IV SOLN
25.0000 mL | Freq: Once | INTRAVENOUS | Status: AC
  Administered 2022-05-23: 25 mL via INTRAVENOUS

## 2022-05-23 MED ORDER — LACTATED RINGERS IV SOLN: INTRAVENOUS | Status: DC

## 2022-05-23 MED ORDER — DEXTROSE 50 % IV SOLN
INTRAVENOUS | Status: AC
  Filled 2022-05-23: qty 50

## 2022-05-23 MED ORDER — LIDOCAINE HCL (CARDIAC) PF 100 MG/5ML IV SOSY
PREFILLED_SYRINGE | INTRAVENOUS | Status: DC | PRN
  Administered 2022-05-23: 40 mg via INTRAVENOUS

## 2022-05-23 MED ORDER — PROPOFOL 10 MG/ML IV BOLUS
INTRAVENOUS | Status: DC | PRN
  Administered 2022-05-23: 70 mg via INTRAVENOUS
  Administered 2022-05-23: 30 mg via INTRAVENOUS
  Administered 2022-05-23: 20 mg via INTRAVENOUS

## 2022-05-23 MED ORDER — PROPOFOL 500 MG/50ML IV EMUL
INTRAVENOUS | Status: DC | PRN
  Administered 2022-05-23: 150 ug/kg/min via INTRAVENOUS

## 2022-05-23 MED ORDER — EPHEDRINE SULFATE (PRESSORS) 50 MG/ML IJ SOLN
INTRAMUSCULAR | Status: DC | PRN
  Administered 2022-05-23: 10 mg via INTRAVENOUS
  Administered 2022-05-23: 5 mg via INTRAVENOUS
  Administered 2022-05-23: 10 mg via INTRAVENOUS

## 2022-05-23 MED ORDER — IPRATROPIUM-ALBUTEROL 0.5-2.5 (3) MG/3ML IN SOLN: 3.0000 mL | Freq: Once | RESPIRATORY_TRACT | Status: DC

## 2022-05-23 MED ORDER — LIDOCAINE VISCOUS HCL 2 % MT SOLN
OROMUCOSAL | Status: AC
  Filled 2022-05-23: qty 15

## 2022-05-23 MED ORDER — LIDOCAINE VISCOUS HCL 2 % MT SOLN
15.0000 mL | Freq: Once | OROMUCOSAL | Status: AC
  Administered 2022-05-23: 15 mL via OROMUCOSAL

## 2022-05-23 MED ORDER — IPRATROPIUM-ALBUTEROL 0.5-2.5 (3) MG/3ML IN SOLN
3.0000 mL | Freq: Once | RESPIRATORY_TRACT | Status: AC
  Administered 2022-05-23: 3 mL via RESPIRATORY_TRACT

## 2022-05-23 MED ORDER — IPRATROPIUM-ALBUTEROL 0.5-2.5 (3) MG/3ML IN SOLN
RESPIRATORY_TRACT | Status: AC
  Filled 2022-05-23: qty 3

## 2022-05-23 NOTE — Telephone Encounter (Signed)
-----   Message from Tanda Rockers, MD sent at 05/23/2022  4:11 PM EDT ----- Regarding: FW: LDCT RADS 4 Needs PET  on or  when returns on 06/06/22 for ov for Multiple pulmonary nodules ----- Message ----- From: Magdalen Spatz, NP Sent: 05/23/2022   3:08 PM EDT To: Christie Beckers, RN; Tanda Rockers, MD; # Subject: RE: LDCT RADS 4                                Lung Cancer Screening when ordered through the PCP is their responsibility. Until they make this a scan only we can order, this will continue to happen. They do not follow up. This is a huge liability issue for those who order outside the program. This is a system issue. Nothing I can fix without [facility] changing the policy. Has the patient been called with the results? We need to make sure she gets appropriate follow up. She needs a PET for sure.I don't see PFT's , is she someone you feel would be a surgical candidate? ----- Message ----- From: Tanda Rockers, MD Sent: 05/20/2022   5:29 PM EDT To: Magdalen Spatz, NP; Collene Gobble, MD Subject: FW: LDCT RADS 4                                Looks like this was done by PCP and I was aware it had been ordered on the day I saw her but then I closed the note and was not sent a copy of the study.  This was one of my fears when we started the program (too many cooks in the kitchen) .  Sarah, see if you can troubleshoot what happened so it doesn't happen again and order the PET and f/u with Rob  Thx! ----- Message ----- From: Collene Gobble, MD Sent: 05/20/2022   2:49 PM EDT To: Magdalen Spatz, NP; Tanda Rockers, MD Subject: LDCT RADS 4                                    Saw that her LDCT was suspicious.  Would get PET + PFT if you think she is s surgical candidate. Otherwise send her to either [facility] or [facility] to consider diagnostics. Let me know if I can help.   Rob

## 2022-05-23 NOTE — Discharge Instructions (Signed)
EGD Discharge instructions Please read the instructions outlined below and refer to this sheet in the next few weeks. These discharge instructions provide you with general information on caring for yourself after you leave the hospital. Your doctor may also give you specific instructions. While your treatment has been planned according to the most current medical practices available, unavoidable complications occasionally occur. If you have any problems or questions after discharge, please call your doctor. ACTIVITY You may resume your regular activity but move at a slower pace for the next 24 hours.  Take frequent rest periods for the next 24 hours.  Walking will help expel (get rid of) the air and reduce the bloated feeling in your abdomen.  No driving for 24 hours (because of the anesthesia (medicine) used during the test).  You may shower.  Do not sign any important legal documents or operate any machinery for 24 hours (because of the anesthesia used during the test).  NUTRITION Drink plenty of fluids.  You may resume your normal diet.  Begin with a light meal and progress to your normal diet.  Avoid alcoholic beverages for 24 hours or as instructed by your caregiver.  MEDICATIONS You may resume your normal medications unless your caregiver tells you otherwise.  WHAT YOU CAN EXPECT TODAY You may experience abdominal discomfort such as a feeling of fullness or "gas" pains.  FOLLOW-UP Your doctor will discuss the results of your test with you.  SEEK IMMEDIATE MEDICAL ATTENTION IF ANY OF THE FOLLOWING OCCUR: Excessive nausea (feeling sick to your stomach) and/or vomiting.  Severe abdominal pain and distention (swelling).  Trouble swallowing.  Temperature over 101 F (37.8 C).  Rectal bleeding or vomiting of blood.     Colonoscopy Discharge Instructions  Read the instructions outlined below and refer to this sheet in the next few weeks. These discharge instructions provide you with  general information on caring for yourself after you leave the hospital. Your doctor may also give you specific instructions. While your treatment has been planned according to the most current medical practices available, unavoidable complications occasionally occur.   ACTIVITY You may resume your regular activity, but move at a slower pace for the next 24 hours.  Take frequent rest periods for the next 24 hours.  Walking will help get rid of the air and reduce the bloated feeling in your belly (abdomen).  No driving for 24 hours (because of the medicine (anesthesia) used during the test).   Do not sign any important legal documents or operate any machinery for 24 hours (because of the anesthesia used during the test).  NUTRITION Drink plenty of fluids.  You may resume your normal diet as instructed by your doctor.  Begin with a light meal and progress to your normal diet. Heavy or fried foods are harder to digest and may make you feel sick to your stomach (nauseated).  Avoid alcoholic beverages for 24 hours or as instructed.  MEDICATIONS You may resume your normal medications unless your doctor tells you otherwise.  WHAT YOU CAN EXPECT TODAY Some feelings of bloating in the abdomen.  Passage of more gas than usual.  Spotting of blood in your stool or on the toilet paper.  IF YOU HAD POLYPS REMOVED DURING THE COLONOSCOPY: No aspirin products for 7 days or as instructed.  No alcohol for 7 days or as instructed.  Eat a soft diet for the next 24 hours.  FINDING OUT THE RESULTS OF YOUR TEST Not all test results are  available during your visit. If your test results are not back during the visit, make an appointment with your caregiver to find out the results. Do not assume everything is normal if you have not heard from your caregiver or the medical facility. It is important for you to follow up on all of your test results.  SEEK IMMEDIATE MEDICAL ATTENTION IF: You have more than a spotting of  blood in your stool.  Your belly is swollen (abdominal distention).  You are nauseated or vomiting.  You have a temperature over 101.  You have abdominal pain or discomfort that is severe or gets worse throughout the day.   Your EGD revealed mild amount inflammation in your stomach.  I took biopsies of this to rule out infection with a bacteria called H. pylori.  Await pathology results, my office will contact you.  I also took samples of your esophagus.  I did not see any obvious tightening of your esophagus so I elected not to stretch it today.  Continue on pantoprazole.  Your colonoscopy revealed 2 polyp(s) which I removed successfully. Await pathology results, my office will contact you. I recommend repeating colonoscopy in 5-10 years for surveillance purposes.   You also have diverticulosis and internal hemorrhoids. I would recommend increasing fiber in your diet or adding OTC Benefiber/Metamucil. Be sure to drink at least 4 to 6 glasses of water daily.   Follow-up with GI in 3-4 months   I hope you have a great rest of your week!  Elon Alas. Abbey Chatters, D.O. Gastroenterology and Hepatology [facility]

## 2022-05-23 NOTE — Telephone Encounter (Signed)
Pet order placed.

## 2022-05-23 NOTE — Anesthesia Postprocedure Evaluation (Signed)
Anesthesia Post Note  Patient: Vicki Blankenship [facility]  Procedure(s) Performed: COLONOSCOPY WITH PROPOFOL ESOPHAGOGASTRODUODENOSCOPY (EGD) WITH PROPOFOL BIOPSY POLYPECTOMY  Patient location during evaluation: Phase II Anesthesia Type: General Level of consciousness: awake and alert and oriented Pain management: pain level controlled Vital Signs Assessment: post-procedure vital signs reviewed and stable Respiratory status: spontaneous breathing, nonlabored ventilation and respiratory function stable Cardiovascular status: blood pressure returned to baseline and stable Postop Assessment: no apparent nausea or vomiting Anesthetic complications: no   No notable events documented.   Last Vitals:  Vitals:   05/23/22 1045 05/23/22 1132  BP:  (!) 105/57  Pulse: (!) 55 73  Resp: 15 (!) 21  Temp:  37.1 C  SpO2: 100% 100%    Last Pain:  Vitals:   05/23/22 1132  TempSrc: Oral  PainSc: 0-No pain                 Vicki Blankenship

## 2022-05-23 NOTE — H&P (Signed)
Primary Care Physician:  Tilda Burrow, NP Primary Gastroenterologist:  Dr. Abbey Chatters  Pre-Procedure History & Physical: HPI:  Vicki Blankenship is a 66 y.o. female is here for EGD with possible dilation due to chronic GERD/dysphagia, and colonoscopy for rectal bleeding.  Past Medical History:  Diagnosis Date   Anxiety    Arthritis    Bipolar disorder ([place])    COPD (chronic obstructive pulmonary disease) (HCC)    Depression    Diabetes ([place])    Type II   Diabetes mellitus, type II (HCC)    Dyspnea    GERD (gastroesophageal reflux disease)    Headache    migraines- 3 times a week   HLD (hyperlipidemia)    HTN (hypertension)    Pneumonia    PTSD (post-traumatic stress disorder)     Past Surgical History:  Procedure Laterality Date   CHOLECYSTECTOMY  2013   COLONOSCOPY  2015   In [place].  Per patient, she had colon polyps.   FOOT SURGERY Left    LUMBAR LAMINECTOMY/ DECOMPRESSION WITH MET-RX Left 04/14/2020   Procedure: Left Lumbar Four-Five Minimally invasive lumbar decompression;  Surgeon: Judith Part, MD;  Location: [facility];  Service: Neurosurgery;  Laterality: Left;  Left Lumbar Four-Five Minimally invasive lumbar decompression    Prior to Admission medications   Medication Sig Start Date End Date Taking? Authorizing Provider  aspirin 81 MG chewable tablet Chew 81 mg by mouth daily.   Yes [provider]  atorvastatin (LIPITOR) 40 MG tablet Take 40 mg by mouth daily. 10/07/19  Yes [provider]  budesonide-formoterol (SYMBICORT) 80-4.5 MCG/ACT inhaler Take 2 puffs first thing in am and then another 2 puffs about 12 hours later. 04/18/22  Yes Tanda Rockers, MD  butalbital-acetaminophen-caffeine (FIORICET) [id number] MG tablet Take 1 tablet by mouth every 4 (four) hours as needed for migraine. 05/01/22  Yes [provider]  clonazePAM (KLONOPIN) 0.5 MG tablet Take 0.5 mg by mouth 3 (three) times daily. 08/24/20  Yes [provider]   DULoxetine (CYMBALTA) 30 MG capsule Take 30 mg by mouth every morning. 03/15/22  Yes [provider]  DULoxetine (CYMBALTA) 60 MG capsule Take 60 mg by mouth at bedtime. 03/08/22  Yes [provider]  hydrochlorothiazide (HYDRODIURIL) 25 MG tablet Take 25 mg by mouth daily. 10/21/19  Yes [provider]  lithium carbonate 300 MG capsule Take 300 mg by mouth in the morning and at bedtime. 10/24/19  Yes [provider]  losartan (COZAAR) 25 MG tablet Take 25 mg by mouth daily. 10/07/19  Yes [provider]  metFORMIN (GLUCOPHAGE) 500 MG tablet Take 500 mg by mouth daily with breakfast. 10/07/19  Yes [provider]  Multiple Vitamin (MULTIVITAMIN ADULT PO) Take 1 tablet by mouth daily.   Yes [provider]  naloxone (NARCAN) 4 MG/0.1ML LIQD nasal spray kit Place 1 spray into the nose as needed (opioid overdose).   Yes [provider]  OXYGEN Inhale 2 L into the lungs at bedtime.   Yes [provider]  pantoprazole (PROTONIX) 40 MG tablet Take 1 tablet (40 mg total) by mouth daily before breakfast. 03/22/22  Yes Mahala Menghini, PA-C  Phenylephrine-Acetaminophen (TYLENOL SINUS+HEADACHE) 5-325 MG TABS Take 1-2 tablets by mouth 2 (two) times daily as needed (sinus congestion).   Yes [provider]  QUEtiapine (SEROQUEL) 400 MG tablet Take 400 mg by mouth at bedtime. 04/05/22  Yes [provider]  QUEtiapine (SEROQUEL) 50 MG tablet  Take 50 mg by mouth every morning. 05/04/22  Yes [provider]  vitamin B-12 (CYANOCOBALAMIN) 1000 MCG tablet Take 1,000 mcg by mouth daily.   Yes [provider]  vitamin C (ASCORBIC ACID) 500 MG tablet Take 500 mg by mouth daily.   Yes [provider]  Vitamin D, Cholecalciferol, 25 MCG (1000 UT) TABS Take 1,000 Units by mouth daily.   Yes [provider]  polyethylene glycol-electrolytes (TRILYTE) 420 g solution Take 4,000 mLs by mouth as  directed. 04/19/22   Eloise Harman, DO    Allergies as of 04/19/2022 - Review Complete 04/18/2022  Allergen Reaction Noted   Penicillins  10/30/2019   Wellbutrin [bupropion]  10/30/2019    Family History  Problem Relation Age of Onset   Diabetes Mother    Cancer Father    Heart disease Father    Colon cancer Neg Hx     Social History   Socioeconomic History   Marital status: Married    Spouse name: Not on file   Number of children: Not on file   Years of education: Not on file   Highest education level: Not on file  Occupational History   Not on file  Tobacco Use   Smoking status: Every Day    Packs/day: 0.50    Years: 40.00    Total pack years: 20.00    Types: Cigarettes   Smokeless tobacco: Never   Tobacco comments:    Smoking 10 cigarettes per day  Vaping Use   Vaping Use: Former  Substance and Sexual Activity   Alcohol use: Never   Drug use: Never   Sexual activity: Yes  Other Topics Concern   Not on file  Social History Narrative   Not on file   Social Determinants of Health   Financial Resource Strain: Not on file  Food Insecurity: Not on file  Transportation Needs: Not on file  Physical Activity: Not on file  Stress: Not on file  Social Connections: Not on file  Intimate Partner Violence: Not on file    Review of Systems: See HPI, otherwise negative ROS  Physical Exam: Vital signs in last 24 hours: Temp:  [98.5 F (36.9 C)] 98.5 F (36.9 C) (06/19 1020) Pulse Rate:  [54-59] 55 (06/19 1045) Resp:  [15-22] 15 (06/19 1045) BP: (154)/(93) 154/93 (06/19 1020) SpO2:  [93 %-100 %] 100 % (06/19 1045)   General:   Alert,  Well-developed, well-nourished, pleasant and cooperative in NAD Head:  Normocephalic and atraumatic. Eyes:  Sclera clear, no icterus.   Conjunctiva pink. Ears:  Normal auditory acuity. Nose:  No deformity, discharge,  or lesions. Mouth:  No deformity or lesions, dentition normal. Neck:  Supple; no masses or  thyromegaly. Lungs:  Clear throughout to auscultation.   No wheezes, crackles, or rhonchi. No acute distress. Heart:  Regular rate and rhythm; no murmurs, clicks, rubs,  or gallops. Abdomen:  Soft, nontender and nondistended. No masses, hepatosplenomegaly or hernias noted. Normal bowel sounds, without guarding, and without rebound.   Msk:  Symmetrical without gross deformities. Normal posture. Extremities:  Without clubbing or edema. Neurologic:  Alert and  oriented x4;  grossly normal neurologically. Skin:  Intact without significant lesions or rashes. Cervical Nodes:  No significant cervical adenopathy. Psych:  Alert and cooperative. Normal mood and affect.  Impression/Plan: Vicki Blankenship is here for EGD with possible dilation due to chronic GERD/dysphagia, and colonoscopy for rectal bleeding.  The risks of the procedure including infection, bleed,  or perforation as well as benefits, limitations, alternatives and imponderables have been reviewed with the patient. Questions have been answered. All parties agreeable.

## 2022-05-23 NOTE — Op Note (Signed)
[facility] Patient Name: Vicki Blankenship Procedure Date: 05/23/2022 10:54 AM MRN: [id number] Date of Birth: 30-May-1956 Attending MD: Elon Alas. Edgar Frisk CSN: [id number] Age: 66 Admit Type: Outpatient Procedure:                Upper GI endoscopy Indications:              Dysphagia, Heartburn Providers:                Elon Alas. Abbey Chatters, DO, Charlsie Quest. Theda Sers RN, RN,                            Everardo Pacific, Randa Spike, Technician Referring MD:              Medicines:                See the Anesthesia note for documentation of the                            administered medications Complications:            No immediate complications. Estimated Blood Loss:     Estimated blood loss was minimal. Procedure:                Pre-Anesthesia Assessment:                           - The anesthesia plan was to use monitored                            anesthesia care (MAC).                           After obtaining informed consent, the endoscope was                            passed under direct vision. Throughout the                            procedure, the patient's blood pressure, pulse, and                            oxygen saturations were monitored continuously. The                            [id number]-H190 ([id number]) scope was introduced through the                            mouth, and advanced to the second part of duodenum.                            The upper GI endoscopy was accomplished without                            difficulty. The patient tolerated the procedure                            well. Scope  In: 11:02:50 AM Scope Out: 11:07:58 AM Total Procedure Duration: 0 hours 5 minutes 8 seconds  Findings:      The Z-line was regular and was found 41 cm from the incisors.      Mucosal changes characterized by nodularity were found in the middle       third of the esophagus. Biopsies were taken with a cold forceps for       histology.      Diffuse mild inflammation  characterized by erythema was found in the       entire examined stomach. Biopsies were taken with a cold forceps for       Helicobacter pylori testing.      The duodenal bulb, first portion of the duodenum and second portion of       the duodenum were normal. Impression:               - Z-line regular, 41 cm from the incisors.                           - Nodular mucosa in the esophagus. Biopsied.                           - Gastritis. Biopsied.                           - Normal duodenal bulb, first portion of the                            duodenum and second portion of the duodenum. Moderate Sedation:      Per Anesthesia Care Recommendation:           - Patient has a contact number available for                            emergencies. The signs and symptoms of potential                            delayed complications were discussed with the                            patient. Return to normal activities tomorrow.                            Written discharge instructions were provided to the                            patient.                           - Resume previous diet.                           - Continue present medications.                           - Await pathology results.                           -  Use Protonix (pantoprazole) 40 mg PO daily.                           - Return to GI clinic in 4 months. Procedure Code(s):        --- Professional ---                           [phone], Esophagogastroduodenoscopy, flexible,                            transoral; with biopsy, single or multiple Diagnosis Code(s):        --- Professional ---                           K22.8, Other specified diseases of esophagus                           K29.70, Gastritis, unspecified, without bleeding                           R13.10, Dysphagia, unspecified                           R12, Heartburn CPT copyright 2019 American Medical Association. All rights reserved. The codes documented in this  report are preliminary and upon coder review may  be revised to meet current compliance requirements. Elon Alas. Abbey Chatters, DO [name], DO 05/23/2022 11:29:42 AM This report has been signed electronically. Number of Addenda: 0

## 2022-05-23 NOTE — Transfer of Care (Signed)
Immediate Anesthesia Transfer of Care Note  Patient: Vicki Blankenship [facility]  Procedure(s) Performed: COLONOSCOPY WITH PROPOFOL ESOPHAGOGASTRODUODENOSCOPY (EGD) WITH PROPOFOL BIOPSY POLYPECTOMY  Patient Location: Short Stay  Anesthesia Type:General  Level of Consciousness: awake  Airway & Oxygen Therapy: Patient Spontanous Breathing  Post-op Assessment: Report given to RN and Post -op Vital signs reviewed and stable  Post vital signs: Reviewed and stable  Last Vitals:  Vitals Value Taken Time  BP    Temp    Pulse    Resp    SpO2 100%     Last Pain:  Vitals:   05/23/22 1057  PainSc: 0-No pain         Complications: No notable events documented.

## 2022-05-23 NOTE — Anesthesia Preprocedure Evaluation (Signed)
Anesthesia Evaluation  Patient identified by MRN, date of birth, ID band Patient awake    Reviewed: Allergy & Precautions, NPO status , Patient's Chart, lab work & pertinent test results  Airway Mallampati: II  TM Distance: >3 FB Neck ROM: Full    Dental  (+) Edentulous Upper, Edentulous Lower   Pulmonary shortness of breath, with exertion and Long-Term Oxygen Therapy, pneumonia, COPD (oxygen during night),  COPD inhaler and oxygen dependent, Current Smoker and Patient abstained from smoking.,  IMPRESSION: 1. Lung-RADS 4B, suspicious. Additional imaging evaluation or consultation with Pulmonology or Thoracic Surgery recommended. Right middle and right upper lobe pulmonary nodules, suspicious for primary bronchogenic carcinoma or carcinomas. Consider PET of the right middle lobe nodule. The right upper lobe nodule is at the low end of PET resolution. 2. No thoracic adenopathy. 3. Age advanced coronary artery atherosclerosis. Recommend assessment of coronary risk factors. 4. Aortic atherosclerosis (ICD10-I70.0) and emphysema (ICD10-J43.9). 5. Common duct dilatation after cholecystectomy, of indeterminate acuity. Consider correlation with bilirubin levels. If elevated, consider MRCP.  These results will be called to the ordering clinician or representative by the Radiologist Assistant, and communication documented in the PACS or [employer].   Electronically Signed   By: Abigail Miyamoto M.D.   On: 05/19/2022 14:04    + wheezing      Cardiovascular Exercise Tolerance: Poor hypertension, Pt. on medications Normal cardiovascular exam Rhythm:Regular Rate:Normal     Neuro/Psych  Headaches, PSYCHIATRIC DISORDERS Anxiety Depression Bipolar Disorder  Neuromuscular disease    GI/Hepatic Neg liver ROS, GERD  Medicated and Controlled,  Endo/Other  diabetes, Well Controlled, Type 2, Oral Hypoglycemic Agents  Renal/GU negative  Renal ROS  negative genitourinary   Musculoskeletal  (+) Arthritis , Osteoarthritis,    Abdominal   Peds negative pediatric ROS (+)  Hematology negative hematology ROS (+)   Anesthesia Other Findings Lumbar radiculopathy  Reproductive/Obstetrics negative OB ROS                            Anesthesia Physical Anesthesia Plan  ASA: 4  Anesthesia Plan: General   Post-op Pain Management: Minimal or no pain anticipated   Induction: Intravenous  PONV Risk Score and Plan: Propofol infusion  Airway Management Planned: Nasal Cannula and Natural Airway  Additional Equipment:   Intra-op Plan:   Post-operative Plan:   Informed Consent: I have reviewed the patients History and Physical, chart, labs and discussed the procedure including the risks, benefits and alternatives for the proposed anesthesia with the patient or authorized representative who has indicated his/her understanding and acceptance.     Dental advisory given  Plan Discussed with: CRNA and Surgeon  Anesthesia Plan Comments:        Anesthesia Quick Evaluation

## 2022-05-23 NOTE — Op Note (Signed)
[facility] Patient Name: Vicki Blankenship Procedure Date: 05/23/2022 11:10 AM MRN: [id number] Date of Birth: 04-08-1956 Attending MD: Elon Alas. Abbey Chatters DO CSN: [id number] Age: 66 Admit Type: Outpatient Procedure:                Colonoscopy Indications:              Rectal bleeding Providers:                Elon Alas. Abbey Chatters, DO, Selena Lesser RN, RN,                            Everardo Pacific, Randa Spike, Technician Referring MD:              Medicines:                See the Anesthesia note for documentation of the                            administered medications Complications:            No immediate complications. Estimated Blood Loss:     Estimated blood loss was minimal. Procedure:                Pre-Anesthesia Assessment:                           - The anesthesia plan was to use monitored                            anesthesia care (MAC).                           After obtaining informed consent, the colonoscope                            was passed under direct vision. Throughout the                            procedure, the patient's blood pressure, pulse, and                            oxygen saturations were monitored continuously. The                            [id number]-HQ190L ([id number]) scope was introduced through                            the anus and advanced to the the cecum, identified                            by appendiceal orifice and ileocecal valve. The                            colonoscopy was performed without difficulty. The                            patient tolerated the procedure well. The  quality                            of the bowel preparation was evaluated using the                            BBPS [facility] Bowel Preparation Scale) with scores                            of: Right Colon = 3, Transverse Colon = 3 and Left                            Colon = 3 (entire mucosa seen well with no residual                            staining, small  fragments of stool or opaque                            liquid). The total BBPS score equals 9. Scope In: 11:13:35 AM Scope Out: 11:28:08 AM Scope Withdrawal Time: 0 hours 10 minutes 49 seconds  Total Procedure Duration: 0 hours 14 minutes 33 seconds  Findings:      The perianal and digital rectal examinations were normal.      Non-bleeding internal hemorrhoids were found during retroflexion.      Multiple small and large-mouthed diverticula were found in the sigmoid       colon.      Two sessile polyps were found in the sigmoid colon. The polyps were 4 to       5 mm in size. These polyps were removed with a cold snare. Resection and       retrieval were complete.      The exam was otherwise without abnormality. Impression:               - Non-bleeding internal hemorrhoids.                           - Diverticulosis in the sigmoid colon.                           - Two 4 to 5 mm polyps in the sigmoid colon,                            removed with a cold snare. Resected and retrieved.                           - The examination was otherwise normal. Moderate Sedation:      Per Anesthesia Care Recommendation:           - Patient has a contact number available for                            emergencies. The signs and symptoms of potential                            delayed complications were discussed with the  patient. Return to normal activities tomorrow.                            Written discharge instructions were provided to the                            patient.                           - Resume previous diet.                           - Continue present medications.                           - Await pathology results.                           - Repeat colonoscopy in 5-10 years for surveillance.                           - Return to GI clinic in 4 months. Procedure Code(s):        --- Professional ---                           [id number], Colonoscopy,  flexible; with removal of                            tumor(s), polyp(s), or other lesion(s) by snare                            technique Diagnosis Code(s):        --- Professional ---                           K64.8, Other hemorrhoids                           K63.5, Polyp of colon                           K62.5, Hemorrhage of anus and rectum                           K57.30, Diverticulosis of large intestine without                            perforation or abscess without bleeding CPT copyright 2019 American Medical Association. All rights reserved. The codes documented in this report are preliminary and upon coder review may  be revised to meet current compliance requirements. Elon Alas. Abbey Chatters, DO [place] Abbey Chatters, DO 05/23/2022 11:33:17 AM This report has been signed electronically. Number of Addenda: 0

## 2022-05-24 LAB — GLUCOSE, CAPILLARY: Glucose-Capillary: 102 mg/dL — ABNORMAL HIGH (ref 70–99)

## 2022-05-25 ENCOUNTER — Other Ambulatory Visit: Payer: Self-pay

## 2022-05-25 DIAGNOSIS — J449 Chronic obstructive pulmonary disease, unspecified: Secondary | ICD-10-CM

## 2022-05-25 LAB — SURGICAL PATHOLOGY

## 2022-05-27 ENCOUNTER — Encounter (HOSPITAL_COMMUNITY): Payer: Self-pay | Admitting: Internal Medicine

## 2022-06-02 ENCOUNTER — Encounter (HOSPITAL_COMMUNITY)
Admission: RE | Admit: 2022-06-02 | Discharge: 2022-06-02 | Disposition: A | Discharge status: Home / Self Care | Payer: Medicare HMO | Source: Ambulatory Visit | Attending: Internal Medicine | Admitting: Internal Medicine

## 2022-06-02 DIAGNOSIS — R918 Other nonspecific abnormal finding of lung field: Secondary | ICD-10-CM

## 2022-06-02 MED ORDER — FLUDEOXYGLUCOSE F - 18 (FDG) INJECTION
7.5600 | Freq: Once | INTRAVENOUS | Status: AC | PRN
  Administered 2022-06-02: 7.56 via INTRAVENOUS

## 2022-06-06 ENCOUNTER — Encounter: Payer: Self-pay | Admitting: Internal Medicine

## 2022-06-06 ENCOUNTER — Ambulatory Visit: Payer: Medicare HMO | Admitting: Internal Medicine

## 2022-06-06 VITALS — BP 134/78 | HR 74 | Temp 98.6°F | Wt 143.2 lb

## 2022-06-06 DIAGNOSIS — J449 Chronic obstructive pulmonary disease, unspecified: Secondary | ICD-10-CM

## 2022-06-06 DIAGNOSIS — J9611 Chronic respiratory failure with hypoxia: Secondary | ICD-10-CM

## 2022-06-06 DIAGNOSIS — F17210 Nicotine dependence, cigarettes, uncomplicated: Secondary | ICD-10-CM

## 2022-06-06 DIAGNOSIS — R918 Other nonspecific abnormal finding of lung field: Secondary | ICD-10-CM | POA: Diagnosis not present

## 2022-06-06 DIAGNOSIS — J9612 Chronic respiratory failure with hypercapnia: Secondary | ICD-10-CM

## 2022-06-06 DIAGNOSIS — F17200 Nicotine dependence, unspecified, uncomplicated: Secondary | ICD-10-CM

## 2022-06-06 MED ORDER — DOXYCYCLINE HYCLATE 100 MG PO TABS: 100.0000 mg | ORAL_TABLET | Freq: Two times a day (BID) | ORAL | 0 refills | Status: DC

## 2022-06-06 MED ORDER — PREDNISONE 10 MG PO TABS: ORAL_TABLET | ORAL | 0 refills | Status: DC

## 2022-06-06 MED ORDER — BEVESPI AEROSPHERE 9-4.8 MCG/ACT IN AERO: INHALATION_SPRAY | RESPIRATORY_TRACT | 11 refills | Status: DC

## 2022-06-06 NOTE — Progress Notes (Unsigned)
Vicki Blankenship, female    DOB: 10-24-1956    MRN: [id number]   Brief patient profile:  40  yowf active smoker from [place]  grew up in house with bronchitis/ear infections referred to pulmonary clinic in [facility]  04/18/2022 by Dr Yong Channel.  Daily symptoms since 2011 placed on advair > thrush and since around 2012 just on albuterol and 02 hs since 2010    History of Present Illness  04/18/2022  Pulmonary/ 1st office eval/ Vicki Blankenship / [place] [facility]  Chief Complaint  Patient presents with   Consult    Hx of COPD diagnosed around 2010.   Dyspnea:  [address] [employer]/ chest tightness walking to mailbox x years 200 ft uphill  to mailbox  Cough: worse since pna dec 2022 esp first thing worse x sev hours x brownish slt bloody mucus and epistaxis since jan 2023  Sleep: on 02 2lpm / flat bed 2 pillows   SABA use: once daily at most but very poor hfa 0 2  2lpm hs but not with activity  Rec We will call to get your last pft from [place] if available  Plan A = Automatic = Always=    Symbicort  80 (for Breztri) Take 2 puffs first thing in am and then another 2 puffs about 12 hours later.   Work on inhaler technique:  Zpak should improve your cough  Plan B = Backup (to supplement plan A, not to replace it) Only use your albuterol inhaler as a rescue medication Make sure you check your oxygen saturation  AT  your highest level of activity (not after you stop)   to be sure it stays over 90%  Suggested e-cigs as an optional  "one way bridge"  Off all tobacco products    CT chest 04/18/22  LDSCT Lung-RADS 4B, suspicious. Additional imaging evaluation or consultation with Pulmonology or Thoracic Surgery recommended. Right middle and right upper lobe pulmonary nodules, suspicious for primary bronchogenic carcinoma or carcinomas. Consider PET of the right middle lobe nodule. The right upper lobe nodule is at the low end of PET resolution. 2. No thoracic adenopathy. 3. Age advanced coronary artery  atherosclerosis. Recommend assessment of coronary risk factors. 4. Aortic atherosclerosis (ICD10-I70.0) and emphysema (ICD10-J43.9).  PET 06/02/22  1. Hypermetabolic medial right middle lobe nodule, high suspicion for malignancy. 2. The 6 by 4 mm right upper lobe nodule is not discernibly Hypermetabolic  3. Borderline enlarged right lower paratracheal lymph node has a maximum SUV of 2.4, equal to the blood pool. This tends to favor benign process.   06/06/2022  f/u ov/[place] office/Vicki Blankenship re: GOLD 0 copd/ RML nodule  maint on nothing since had to stop symbicort 80  due to mouth irritation   Chief Complaint  Patient presents with   Follow-up    Wants to talk about pet scan done on 6/29  Dyspnea:  [employer] shopping / uses [facility] parking due to  back problems  Cough: mucus is still green in am assoc with "terrible sinuses"  Sleeping: 2lpm flat bed coughing disturbs  SABA use: not using  02: 2lpm hs  Covid status: vax 5 / never infected      No obvious day to day or daytime variability or assoc  mucus plugs or hemoptysis or cp or chest tightness, subjective wheeze or overt  hb symptoms.     Also denies any obvious fluctuation of symptoms with weather or environmental changes or other aggravating or alleviating factors except as  outlined above   No unusual exposure hx or h/o childhood pna/ asthma or knowledge of premature birth.  Current Allergies, Complete Past Medical History, Past Surgical History, Family History, and Social History were reviewed in [employer] record.  ROS  The following are not active complaints unless bolded Hoarseness, sore throat, dysphagia, dental problems, itching, sneezing,  nasal congestion or discharge of excess mucus or purulent secretions, ear ache,   fever, chills, sweats, unintended wt loss or wt gain, classically pleuritic or exertional cp,  orthopnea pnd or arm/hand swelling  or leg swelling, presyncope, palpitations, abdominal  pain, anorexia, nausea, vomiting, diarrhea  or change in bowel habits or change in bladder habits, change in stools or change in urine, dysuria, hematuria,  rash, arthralgias, visual complaints, headache, numbness, weakness or ataxia or problems with walking or coordination,  change in mood or  memory.        Current Meds  Medication Sig   aspirin 81 MG chewable tablet Chew 81 mg by mouth daily.   atorvastatin (LIPITOR) 40 MG tablet Take 40 mg by mouth daily.   budesonide-formoterol (SYMBICORT) 80-4.5 MCG/ACT inhaler Take 2 puffs first thing in am and then another 2 puffs about 12 hours later.   butalbital-acetaminophen-caffeine (FIORICET) 50-325-40 MG tablet Take 1 tablet by mouth every 4 (four) hours as needed for migraine.   clonazePAM (KLONOPIN) 0.5 MG tablet Take 0.5 mg by mouth 3 (three) times daily.   DULoxetine (CYMBALTA) 30 MG capsule Take 30 mg by mouth every morning.   DULoxetine (CYMBALTA) 60 MG capsule Take 60 mg by mouth at bedtime.   hydrochlorothiazide (HYDRODIURIL) 25 MG tablet Take 25 mg by mouth daily.   lithium carbonate 300 MG capsule Take 300 mg by mouth in the morning and at bedtime.   losartan (COZAAR) 25 MG tablet Take 25 mg by mouth daily.   metFORMIN (GLUCOPHAGE) 500 MG tablet Take 500 mg by mouth daily with breakfast.   Multiple Vitamin (MULTIVITAMIN ADULT PO) Take 1 tablet by mouth daily.   naloxone (NARCAN) 4 MG/0.1ML LIQD nasal spray kit Place 1 spray into the nose as needed (opioid overdose).   OXYGEN Inhale 2 L into the lungs at bedtime.   pantoprazole (PROTONIX) 40 MG tablet Take 1 tablet (40 mg total) by mouth daily before breakfast.   Phenylephrine-Acetaminophen (TYLENOL SINUS+HEADACHE) 5-325 MG TABS Take 1-2 tablets by mouth 2 (two) times daily as needed (sinus congestion).   QUEtiapine (SEROQUEL) 400 MG tablet Take 400 mg by mouth at bedtime.   QUEtiapine (SEROQUEL) 50 MG tablet Take 50 mg by mouth every morning.   vitamin B-12 (CYANOCOBALAMIN) 1000 MCG  tablet Take 1,000 mcg by mouth daily.   vitamin C (ASCORBIC ACID) 500 MG tablet Take 500 mg by mouth daily.   Vitamin D, Cholecalciferol, 25 MCG (1000 UT) TABS Take 1,000 Units by mouth daily.                    Past Medical History:  Diagnosis Date   Anxiety    Arthritis    Bipolar disorder (HCC)    COPD (chronic obstructive pulmonary disease) (HCC)    Depression    Diabetes (HCC)    Type II   Diabetes mellitus, type II (HCC)    Dyspnea    GERD (gastroesophageal reflux disease)    Headache    migraines- 3 times a week   HLD (hyperlipidemia)    HTN (hypertension)    Pneumonia    PTSD (  post-traumatic stress disorder)        Objective:      Wt Readings from Last 3 Encounters:  06/06/22 143 lb 3.2 oz (65 kg)  05/19/22 149 lb 14.6 oz (68 kg)  04/22/22 150 lb (68 kg)      Vital signs reviewed  06/06/2022  - Note at rest 02 sats  94% on RA   General appearance:    amb wf /congested cough /bilaeral exp rhonch      HEENT : Oropharynx  clear/ no mucositis or thrush  Nasal turbinates mod severe Turbinate edema    NECK :  without  apparent JVD/ palpable Nodes/TM    LUNGS: no acc muscle use,  Min barrel  contour chest wall with bilateral  exp rhonchi  and  without cough on insp or exp maneuvers and min  Hyperresonant  to  percussion bilaterally    CV:  RRR  no s3 or murmur or increase in P2, and no edema   ABD:  soft and nontender with pos end  insp Hoover's  in the supine position.  No bruits or organomegaly appreciated   MS:  Nl gait/ ext warm without deformities Or obvious joint restrictions  calf tenderness, cyanosis or clubbing     SKIN: warm and dry without lesions    NEURO:  alert, approp, nl sensorium with  no motor or cerebellar deficits apparent.              Assessment

## 2022-06-06 NOTE — Patient Instructions (Addendum)
Doxycycline 100 mg twice daily x 10 daily   Plan A = Automatic = Always=    Bevespi 2 puffs 1st thing in am and 12 hours later  Work on inhaler technique:  relax and gently blow all the way out then take a nice smooth full deep breath back in, triggering the inhaler at same time you start breathing in.  Hold for up to 5 seconds if you can.  Rinse and gargle with water when done.  If mouth or throat bother you at all,  try brushing teeth/gums/tongue with arm and hammer toothpaste/ make a slurry and gargle and spit out.       Plan B = Backup (to supplement plan A, not to replace it) Only use your albuterol inhaler as a rescue medication to be used if you can't catch your breath by resting or doing a relaxed purse lip breathing pattern.  - The less you use it, the better it will work when you need it. - Ok to use the inhaler up to 2 puffs  every 4 hours if you must but call for appointment if use goes up over your usual need - Don't leave home without it !!  (think of it like the spare tire for your car)      For cough > mucinex dm 1200 mg every hours as needed   Prednisone 10 mg take  4 each am x 2 days,   2 each am x 2 days,  1 each am x 2 days and stop   PFT's  and  consultation with Thoracic surgery next available preferably same day.  The key is to stop smoking completely before smoking completely stops you!   Please schedule a follow up visit in 3 months but call sooner if needed

## 2022-06-07 ENCOUNTER — Encounter: Payer: Self-pay | Admitting: Internal Medicine

## 2022-06-07 NOTE — Assessment & Plan Note (Addendum)
CT chest 04/18/22  LDSCT Lung-RADS 4B, suspicious. Additional imaging evaluation or consultation with Pulmonology or Thoracic Surgery recommended. Right middle and right upper lobe pulmonary nodules, suspicious for primary bronchogenic carcinoma or carcinomas. Consider PET of the right middle lobe nodule. The right upper lobe nodule is at the low end of PET resolution. 2. No thoracic adenopathy. 3. Age advanced coronary artery atherosclerosis. Recommend assessment of coronary risk factors. 4. Aortic atherosclerosis (ICD10-I70.0) and emphysema (ICD10-J43.9).  PET 06/02/22  1. Hypermetabolic medial right middle lobe nodule, high suspicion for malignancy. 2. The 6 by 4 mm right upper lobe nodule is not discernibly Hypermetabolic  3. Borderline enlarged right lower paratracheal lymph node has a maximum SUV of 2.4, equal to the blood pool. This tends to favor benign process.  Based on prior studies, she could easily tol a RMLobectomy if really is curable by resection which remains to be establish but for now giving her the benefit of the doubt and requesting T surgery opinion (hopefully she'll look a lot better by then clinically - which also remains to be seen).  Discussed in detail all the  indications, usual  risks and alternatives  relative to the benefits with patient who agrees to proceed with w/u as outlined.      Each maintenance medication was reviewed in detail including emphasizing most importantly the difference between maintenance and prns and under what circumstances the prns are to be triggered using an action plan format where appropriate.  Total time for H and P, chart review, counseling, reviewing hfa device(s) , directly observing portions of ambulatory 02 saturation study/ and generating customized AVS unique to this office visit / same day charting > 30 min for multiple  refractory respiratory  symptoms of uncertain etiology

## 2022-06-07 NOTE — Assessment & Plan Note (Signed)
HC03   On 03/01/22 =  31 but as high as 35 in 2001  -  04/18/2022   Walked on RA  x  3  lap(s) =  approx 450  ft  @ moderate pace, stopped due to end of study with lowest 02 sats 91% cc sob @ 3rd lap  - 06/06/2022   Walked on RA   x  3  lap(s) =  approx 450  ft  @ fast pace, stopped due to end of walk with lowest 02 sats 90% with sob on 3rd lap  As of 2lpm hs, none needed with exertion

## 2022-06-07 NOTE — Assessment & Plan Note (Addendum)
Active smoker  - PFTs 06/04/15 no significant obstruction/ ERV 20% at wt 189 / nl dlco  - Holter 01/22/16 x 24 h  Nl but no symptoms reported during monitor - 04/18/2022  After extensive coaching inhaler device,  effectiveness =    50% > try symbicort 80 2bid due to thrush on advair previously plus approp saba > could not tolerate oral side effects of ics - 06/06/2022  After extensive coaching inhaler device,  effectiveness =    75% with baseline of 25%   Group D (now reclassified as E) in terms of symptom/risk and laba/lama/ICS  therefore appropriate rx at this point >>>  breztri but likely intolerant of ICS component    >>>> try Bevespi 2 bid and practice with empty Breztri container  >>> rx acute AB flare with doxy x 10 d in pcn allergic pt (due to ? Assoc sinus dz) and Prednisone 10 mg take  4 each am x 2 days,   2 each am x 2 days,  1 each am x 2 days and stop

## 2022-06-07 NOTE — Assessment & Plan Note (Signed)
4-5 min discussion re active cigarette smoking in addition to office E&M  Ask about tobacco use:   ogoing Advise quitting   Advised will need to commit to at least 2 weeks s cigs preop to be considered for elective T surgery  Assess willingness:  ? Really  committed at this point Assist in quit attempt:  Per PCP when ready Arrange follow up:   Follow up per Primary Care planned

## 2022-06-08 ENCOUNTER — Encounter: Payer: Self-pay | Admitting: *Deleted

## 2022-06-09 NOTE — Progress Notes (Signed)
Referring:  Tilda Burrow, NP [address] [place],  [place] [address]  PCP: Tilda Burrow, NP  Neurology was asked to evaluate Adeliz Tonkinson, a 66 year old female for a chief complaint of headaches.  Our recommendations of care will be communicated by shared medical record.    CC:  headaches  History provided from self, husband  HPI:  Medical co-morbidities: DM, COPD, GERD, lumbar radiculopathy, prediabetes, smoking, left frontal meningioma  The patient presents for evaluation of headaches which began about 30 years ago. She is having headaches daily but does have pain free moments throughout the day. They are described as occipital or temporal throbbing and stabbing pain. She will see floaters in her vision before a headache. Has associated photophobia, phonophobia, and nausea. She also reports a lot of neck tension and stiffness with her headaches as well. Headaches are severe enough that they will wake her up in the middle of the night.   Takes Fioricet a needed which takes the edge off but does not resolve her headaches. She also takes Excedrin daily.  She does have a history of singles in her eye which occurred 15 years ago.  Headache History: Onset: 30 years ago Triggers: stress Aura: floaters Location: occipital, temporal Quality/Description: throbbing, stabbing Associated Symptoms:  Photophobia: yes  Phonophobia: yes  Nausea: yes Worse with activity?: yes Duration of headaches: 30-40 minutes with medication  Headache days per month: 30 Headache free days per month: 0  Current Treatment: Abortive Excedrin Fioricet  Preventative none  Prior Therapies                                 Excedrin Fioricet Tramadol Flexeril Sumatriptan - lack of efficacy Seroquel Botox Cymbalta 90 Topamax amitriptyline Losartan   LABS: CBC    Component Value Date/Time   WBC 9.2 04/14/2020 0828   RBC 4.84 04/14/2020 0828   HGB 15.3 (H) 04/14/2020 0828   HCT  48.9 (H) 04/14/2020 0828   PLT 262 04/14/2020 0828   MCV 101.0 (H) 04/14/2020 0828   MCH 31.6 04/14/2020 0828   MCHC 31.3 04/14/2020 0828   RDW 13.0 04/14/2020 0828      Latest Ref Rng & Units 05/19/2022   10:32 AM 04/08/2022    9:27 AM 04/08/2022   12:00 AM  CMP  Glucose 70 - 99 mg/dL 90     BUN 8 - 23 mg/dL 7   10      Creatinine 0.44 - 1.00 mg/dL 0.74   0.8      Sodium 135 - 145 mmol/L 138   141      Potassium 3.5 - 5.1 mmol/L 3.4   4.3      Chloride 98 - 111 mmol/L 102   102      CO2 22 - 32 mmol/L 29   31      Calcium 8.9 - 10.3 mg/dL 10.2  11.5       This result is from an external source.     IMAGING:  MRI brain 11/14/2019: 1.4 cm left frontoparietal meningioma without mass effect or edema  Imaging independently reviewed on June 10, 2022   Current Outpatient Medications on File Prior to Visit  Medication Sig Dispense Refill   aspirin 81 MG chewable tablet Chew 81 mg by mouth daily.     atorvastatin (LIPITOR) 40 MG tablet Take 40 mg by mouth daily.  butalbital-acetaminophen-caffeine (FIORICET) 50-325-40 MG tablet Take 1 tablet by mouth every 4 (four) hours as needed for migraine.     clonazePAM (KLONOPIN) 0.5 MG tablet Take 0.5 mg by mouth 3 (three) times daily.     doxycycline (VIBRA-TABS) 100 MG tablet Take 1 tablet (100 mg total) by mouth 2 (two) times daily. 20 tablet 0   DULoxetine (CYMBALTA) 30 MG capsule Take 30 mg by mouth every morning.     DULoxetine (CYMBALTA) 60 MG capsule Take 60 mg by mouth at bedtime.     Glycopyrrolate-Formoterol (BEVESPI AEROSPHERE) 9-4.8 MCG/ACT AERO Take 2 puffs first thing in am and then another 2 puffs about 12 hours later. 1 each 11   hydrochlorothiazide (HYDRODIURIL) 25 MG tablet Take 25 mg by mouth daily.     lithium carbonate 300 MG capsule Take 300 mg by mouth in the morning and at bedtime.     losartan (COZAAR) 25 MG tablet Take 25 mg by mouth daily.     metFORMIN (GLUCOPHAGE) 500 MG tablet Take 500 mg by mouth daily with  breakfast.     Multiple Vitamin (MULTIVITAMIN ADULT PO) Take 1 tablet by mouth daily.     naloxone (NARCAN) 4 MG/0.1ML LIQD nasal spray kit Place 1 spray into the nose as needed (opioid overdose).     OXYGEN Inhale 2 L into the lungs at bedtime.     pantoprazole (PROTONIX) 40 MG tablet Take 1 tablet (40 mg total) by mouth daily before breakfast. 90 tablet 3   Phenylephrine-Acetaminophen (TYLENOL SINUS+HEADACHE) 5-325 MG TABS Take 1-2 tablets by mouth 2 (two) times daily as needed (sinus congestion).     predniSONE (DELTASONE) 10 MG tablet Take  4 each am x 2 days,   2 each am x 2 days,  1 each am x 2 days and stop 14 tablet 0   QUEtiapine (SEROQUEL) 400 MG tablet Take 400 mg by mouth at bedtime.     QUEtiapine (SEROQUEL) 50 MG tablet Take 50 mg by mouth every morning.     vitamin B-12 (CYANOCOBALAMIN) 1000 MCG tablet Take 1,000 mcg by mouth daily.     vitamin C (ASCORBIC ACID) 500 MG tablet Take 500 mg by mouth daily.     Vitamin D, Cholecalciferol, 25 MCG (1000 UT) TABS Take 1,000 Units by mouth daily.     No current facility-administered medications on file prior to visit.     Allergies: Allergies  Allergen Reactions   Penicillins Swelling    Tongue swelling   Wellbutrin [Bupropion]     shakes    Family History: Migraine or other headaches in the family:  mother Aneurysms in a first degree relative:  no Brain tumors in the family:  no Other neurological illness in the family:   no  Past Medical History: Past Medical History:  Diagnosis Date   Anxiety    Arthritis    Bipolar disorder ([place])    COPD (chronic obstructive pulmonary disease) (HCC)    Depression    Diabetes ([place])    Type II   Diabetes mellitus, type II ([place])    Dyspnea    GERD (gastroesophageal reflux disease)    Head injury    Headache    migraines- 3 times a week   HLD (hyperlipidemia)    HTN (hypertension)    Lesion of lung 2023   suspect lung cancer per pt   LOC (loss of consciousness) ([place])     Meningioma ([place])    L frontal   Pneumonia  PTSD (post-traumatic stress disorder)    Seizure ([place])    hx of    Past Surgical History Past Surgical History:  Procedure Laterality Date   BIOPSY  05/23/2022   Procedure: BIOPSY;  Surgeon: Eloise Harman, DO;  [facility];  Service: Endoscopy;;   CARPAL TUNNEL RELEASE Right    CHOLECYSTECTOMY  2013   COLONOSCOPY  2015   In [place].  Per patient, she had colon polyps.   COLONOSCOPY WITH PROPOFOL N/A 05/23/2022   Procedure: COLONOSCOPY WITH PROPOFOL;  Surgeon: Eloise Harman, DO;  [facility];  Service: Endoscopy;  Laterality: N/A;  11:00am   ESOPHAGOGASTRODUODENOSCOPY (EGD) WITH PROPOFOL N/A 05/23/2022   Procedure: ESOPHAGOGASTRODUODENOSCOPY (EGD) WITH PROPOFOL;  Surgeon: Eloise Harman, DO;  [facility];  Service: Endoscopy;  Laterality: N/A;   FOOT SURGERY Left    LUMBAR LAMINECTOMY/ DECOMPRESSION WITH MET-RX Left 04/14/2020   Procedure: Left Lumbar Four-Five Minimally invasive lumbar decompression;  Surgeon: Judith Part, MD;  Location: [facility];  Service: Neurosurgery;  Laterality: Left;  Left Lumbar Four-Five Minimally invasive lumbar decompression   POLYPECTOMY  05/23/2022   Procedure: POLYPECTOMY;  Surgeon: Eloise Harman, DO;  [facility];  Service: Endoscopy;;   thumb surgery Left    pin placed    Social History: Social History   Tobacco Use   Smoking status: Every Day    Packs/day: 0.50    Years: 40.00    Total pack years: 20.00    Types: Cigarettes   Smokeless tobacco: Never   Tobacco comments:    Smoking 10 cigarettes per day  Vaping Use   Vaping Use: Former  Substance Use Topics   Alcohol use: Never   Drug use: Never    ROS: Negative for fevers, chills. Positive for headaches. All other systems reviewed and negative unless stated otherwise in HPI.   Physical Exam:   Vital Signs: BP 130/76 (BP Location: Right Arm, Patient Position: Sitting)    Pulse 62   Ht '5\' 4"'  (1.626 m)   Wt 143 lb (64.9 kg)   BMI 24.55 kg/m  GENERAL: well appearing,in no acute distress,alert SKIN:  Color, texture, turgor normal. No rashes or lesions HEAD:  Normocephalic/atraumatic. CV:  RRR RESP: Normal respiratory effort MSK: no tenderness to palpation over occiput, neck, or shoulders  NEUROLOGICAL: Mental Status: Alert, oriented to person, place and time,Follows commands Cranial Nerves: PERRL, visual fields intact to confrontation, extraocular movements intact, facial sensation intact, no facial droop or ptosis, hearing grossly intact, no dysarthria Motor: muscle strength 5/5 both upper and lower extremities Reflexes: 2+ throughout Sensation: intact to light touch all 4 extremities Coordination: Finger-to- nose-finger intact bilaterally Gait: normal-based   IMPRESSION: 66 year old female with a history of  CAD, DM, COPD, GERD, lumbar radiculopathy, prediabetes, smoking, left frontal meningioma who presents for evaluation of headaches. Her headache pattern is most consistent with chronic migraine. She also likely has a component of medication overuse headache in the setting of daily Excedrin use. Counseled on limiting Excedrin use to avoid rebound headaches. She has failed multiple preventive medications including Botox. Will start Marmaduke for migraine prevention. Triptans are contraindicated in coronary artery disease. Will start Nurtec for migraine rescue.  PLAN: -Prevention: Start Aimovig 70 mg monthly -Rescue: Start Nurtec 75 mg PRN -Counseled on limiting Excedrin use to avoid rebound headaches   I spent a total of 32 minutes chart reviewing and counseling the patient. Headache education was done. Discussed  treatment options including preventive and acute medications. Discussed medication overuse headache and to limit use of acute treatments to no more than 2 days/week or 10 days/month. Discussed medication side effects, adverse reactions and  drug interactions. Written educational materials and patient instructions outlining all of the above were given.  Follow-up: 6 months   Genia Harold, MD 06/10/2022   12:26 PM

## 2022-06-10 ENCOUNTER — Encounter: Payer: Self-pay | Admitting: Psychiatry

## 2022-06-10 ENCOUNTER — Ambulatory Visit: Payer: Medicare HMO | Admitting: Psychiatry

## 2022-06-10 ENCOUNTER — Telehealth: Payer: Self-pay | Admitting: *Deleted

## 2022-06-10 VITALS — BP 130/76 | HR 62 | Ht 64.0 in | Wt 143.0 lb

## 2022-06-10 DIAGNOSIS — G43119 Migraine with aura, intractable, without status migrainosus: Secondary | ICD-10-CM | POA: Diagnosis not present

## 2022-06-10 MED ORDER — AIMOVIG 70 MG/ML ~~LOC~~ SOAJ: 70.0000 mg | SUBCUTANEOUS | 0 refills | Status: DC

## 2022-06-10 MED ORDER — AIMOVIG 70 MG/ML ~~LOC~~ SOAJ
70.0000 mg | SUBCUTANEOUS | 6 refills | Status: DC
Start: 2022-06-10 — End: 2022-08-29

## 2022-06-10 MED ORDER — NURTEC 75 MG PO TBDP
75.0000 mg | ORAL_TABLET | ORAL | 0 refills | Status: DC | PRN
Start: 2022-06-10 — End: 2022-08-16

## 2022-06-10 MED ORDER — NURTEC 75 MG PO TBDP
75.0000 mg | ORAL_TABLET | ORAL | 6 refills | Status: DC | PRN
Start: 2022-06-10 — End: 2022-12-01

## 2022-06-10 NOTE — Telephone Encounter (Signed)
Nurtec PA, Key: [id number],  Your information has been submitted to [place] [employer]. [employer] will review the request and will issue a decision, typically within 1-3 days from your submission.  If [employer] has not responded in 1-3 days or if you have any questions about your ePA request, please contact [place] [employer] Part D at [phone].

## 2022-06-10 NOTE — Telephone Encounter (Signed)
Nurtec approved 12/05/2021 - 12/04/2022. Approval letter faxed to pharmacy.

## 2022-06-10 NOTE — Patient Instructions (Signed)
Start Aimovig 70 mg monthly for migraine prevention Start Nurtec 75 mg as needed for migraines. Take one pill at onset of migraine. Max dose one pill in 24 hours Repeat MRI brain in December 2023

## 2022-06-13 ENCOUNTER — Telehealth: Payer: Self-pay

## 2022-06-13 NOTE — Telephone Encounter (Signed)
PA for aimovig has been sent via cmm.   (Key: [id number])  Your information has been submitted to [place] [employer] Part D. [employer] will review the request and will issue a decision, typically within 1-3 days from your submission. You can check the updated outcome later by reopening this request.  If [employer] has not responded in 1-3 days or if you have any questions about your ePA request, please contact [place] Medicare Part D at [phone]. If you think there may be a problem with your PA request, use our live chat feature at the bottom right.

## 2022-06-13 NOTE — Telephone Encounter (Signed)
[employer] approved m 12/05/2021 - 09/11/2022. Approval letter faxed to pharmacy.

## 2022-07-04 ENCOUNTER — Institutional Professional Consult (permissible substitution): Payer: Medicare HMO | Admitting: Thoracic Surgery (Cardiothoracic Vascular Surgery)

## 2022-07-04 ENCOUNTER — Encounter: Payer: Self-pay | Admitting: Thoracic Surgery (Cardiothoracic Vascular Surgery)

## 2022-07-04 VITALS — BP 134/82 | HR 65 | Resp 20 | Ht 64.0 in | Wt 140.5 lb

## 2022-07-04 DIAGNOSIS — R918 Other nonspecific abnormal finding of lung field: Secondary | ICD-10-CM

## 2022-07-04 NOTE — H&P (View-Only) (Signed)
PCP is Tilda Burrow, NP Referring Provider is Tanda Rockers, MD  Chief Complaint  Patient presents with   Lung Lesion    New patient consultation [facility] 6/29, Chest CT 5/15, PFTs pending    HPI: Vicki Blankenship is sent for consultation regarding right middle and upper lobe lung nodules  Vicki Blankenship is a 66 year old woman with a history of tobacco abuse, COPD, type 2 diabetes, hypertension, hyperlipidemia, bipolar disorder, meningioma, head injury, PTSD, seizure history, arthritis, osteopenia, reflux, and depression.  She moved to [place] in 2021.  She started in the low-dose CT for lung cancer screening program in 2022.  She had a CT which showed small nodules measuring up to 6 mm in the right lower lobe.  She had a follow-up scan in June which showed a new or enlarged 12 mm right middle lobe nodule.  A peripheral right upper lobe nodule increased in size from 4 to 6 mm.  There was a 1 cm lower right paratracheal node.  She had a PET/CT which showed the middle lobe nodule was hypermetabolic with an SUV of 4.8.  The right upper lobe nodule is too small to characterize.  The right paratracheal node was at background levels.  She smoked on average about a pack a day for almost 50 years.  She had cut down to about 1/2 pack/day and currently is smoking about 4 cigarettes/day.  She uses oxygen at night and also when she takes a nap during the day.  She recently started using oxygen for exertion.  She complains of a loss of appetite with a 20 pound weight loss over the past 3 months.  She attributes that to anxiety.  She does suffer from headaches and dizziness.  Has difficulty walking due to pain in her hips.  She complains of right sided chest pain that starts along the costal margin and radiates up through the chest.  It usually lasts less than a minute and can come on at rest or exertion.  She is disabled from work.  Zubrod Score: At the time of surgery this patient's most appropriate activity  status/level should be described as: _0     0    Normal activity, no symptoms _1     1    Restricted in physical strenuous activity but ambulatory, able to do out light work _2     2    Ambulatory and capable of self care, unable to do work activities, up and about >50 % of waking hours                              _3     3    Only limited self care, in bed greater than 50% of waking hours _4     4    Completely disabled, no self care, confined to bed or chair _5     5    Moribund  Past Medical History:  Diagnosis Date   Anxiety    Arthritis    Bipolar disorder (HCC)    COPD (chronic obstructive pulmonary disease) ([facility])    Depression    Diabetes ([place])    Type II   Diabetes mellitus, type II ([facility])    Dyspnea    GERD (gastroesophageal reflux disease)    Head injury    Headache    migraines- 3 times a week   HLD (hyperlipidemia)    HTN (hypertension)    Lesion of lung 2023  suspect lung cancer per pt   LOC (loss of consciousness) ([facility])    Meningioma ([facility])    L frontal   Pneumonia    PTSD (post-traumatic stress disorder)    Seizure ([facility])    hx of    Past Surgical History:  Procedure Laterality Date   BIOPSY  05/23/2022   Procedure: BIOPSY;  Surgeon: Eloise Harman, DO;  [facility];  Service: Endoscopy;;   CARPAL TUNNEL RELEASE Right    CHOLECYSTECTOMY  2013   COLONOSCOPY  2015   In [place].  Per patient, she had colon polyps.   COLONOSCOPY WITH PROPOFOL N/A 05/23/2022   Procedure: COLONOSCOPY WITH PROPOFOL;  Surgeon: Eloise Harman, DO;  [facility];  Service: Endoscopy;  Laterality: N/A;  11:00am   ESOPHAGOGASTRODUODENOSCOPY (EGD) WITH PROPOFOL N/A 05/23/2022   Procedure: ESOPHAGOGASTRODUODENOSCOPY (EGD) WITH PROPOFOL;  Surgeon: Eloise Harman, DO;  [facility];  Service: Endoscopy;  Laterality: N/A;   FOOT SURGERY Left    LUMBAR LAMINECTOMY/ DECOMPRESSION WITH MET-RX Left 04/14/2020   Procedure: Left Lumbar Four-Five Minimally  invasive lumbar decompression;  Surgeon: Judith Part, MD;  Location: [facility];  Service: Neurosurgery;  Laterality: Left;  Left Lumbar Four-Five Minimally invasive lumbar decompression   POLYPECTOMY  05/23/2022   Procedure: POLYPECTOMY;  Surgeon: Eloise Harman, DO;  [facility];  Service: Endoscopy;;   thumb surgery Left    pin placed    Family History  Problem Relation Age of Onset   Migraines Mother    Diabetes Mother    Dementia Mother    Cancer Father    Heart disease Father    Colon cancer Neg Hx     Social History Social History   Tobacco Use   Smoking status: Every Day    Packs/day: 0.50    Years: 40.00    Total pack years: 20.00    Types: Cigarettes   Smokeless tobacco: Never   Tobacco comments:    Smoking 10 cigarettes per day  Vaping Use   Vaping Use: Former  Substance Use Topics   Alcohol use: Never   Drug use: Never    Current Outpatient Medications  Medication Sig Dispense Refill   aspirin 81 MG chewable tablet Chew 81 mg by mouth daily.     atorvastatin (LIPITOR) 40 MG tablet Take 40 mg by mouth daily.     butalbital-acetaminophen-caffeine (FIORICET) 50-325-40 MG tablet Take 1 tablet by mouth every 4 (four) hours as needed for migraine.     clonazePAM (KLONOPIN) 0.5 MG tablet Take 0.5 mg by mouth 3 (three) times daily.     doxycycline (VIBRA-TABS) 100 MG tablet Take 1 tablet (100 mg total) by mouth 2 (two) times daily. 20 tablet 0   DULoxetine (CYMBALTA) 30 MG capsule Take 30 mg by mouth every morning.     DULoxetine (CYMBALTA) 60 MG capsule Take 60 mg by mouth at bedtime.     Erenumab-aooe (AIMOVIG) 70 MG/ML SOAJ Inject 70 mg into the skin every 30 (thirty) days. 1.12 mL 6   Erenumab-aooe (AIMOVIG) 70 MG/ML SOAJ Inject 70 mg into the skin every 30 (thirty) days. 1.12 mL 0   Glycopyrrolate-Formoterol (BEVESPI AEROSPHERE) 9-4.8 MCG/ACT AERO Take 2 puffs first thing in am and then another 2 puffs about 12 hours later. 1 each 11    hydrochlorothiazide (HYDRODIURIL) 25 MG tablet Take 25 mg by mouth daily.     lithium carbonate 300 MG capsule Take 300  mg by mouth in the morning and at bedtime.     losartan (COZAAR) 25 MG tablet Take 25 mg by mouth daily.     metFORMIN (GLUCOPHAGE) 500 MG tablet Take 500 mg by mouth daily with breakfast.     Multiple Vitamin (MULTIVITAMIN ADULT PO) Take 1 tablet by mouth daily.     naloxone (NARCAN) 4 MG/0.1ML LIQD nasal spray kit Place 1 spray into the nose as needed (opioid overdose).     OXYGEN Inhale 2 L into the lungs at bedtime.     pantoprazole (PROTONIX) 40 MG tablet Take 1 tablet (40 mg total) by mouth daily before breakfast. 90 tablet 3   Phenylephrine-Acetaminophen (TYLENOL SINUS+HEADACHE) 5-325 MG TABS Take 1-2 tablets by mouth 2 (two) times daily as needed (sinus congestion).     predniSONE (DELTASONE) 10 MG tablet Take  4 each am x 2 days,   2 each am x 2 days,  1 each am x 2 days and stop 14 tablet 0   QUEtiapine (SEROQUEL) 400 MG tablet Take 400 mg by mouth at bedtime.     QUEtiapine (SEROQUEL) 50 MG tablet Take 50 mg by mouth every morning.     Rimegepant Sulfate (NURTEC) 75 MG TBDP Take 75 mg by mouth as needed. Max dose one pill in 24 hours 8 tablet 6   Rimegepant Sulfate (NURTEC) 75 MG TBDP Take 75 mg by mouth as needed. 4 tablet 0   vitamin B-12 (CYANOCOBALAMIN) 1000 MCG tablet Take 1,000 mcg by mouth daily.     vitamin C (ASCORBIC ACID) 500 MG tablet Take 500 mg by mouth daily.     Vitamin D, Cholecalciferol, 25 MCG (1000 UT) TABS Take 1,000 Units by mouth daily.     No current facility-administered medications for this visit.    Allergies  Allergen Reactions   Penicillins Swelling    Tongue swelling   Wellbutrin [Bupropion]     shakes    Review of Systems  Constitutional:  Positive for activity change, appetite change, fatigue and unexpected weight change. Negative for chills and fever.  HENT:  Positive for dental problem (Dentures). Negative for trouble  swallowing and voice change.   Respiratory:  Positive for cough, shortness of breath and wheezing.   Cardiovascular:  Positive for chest pain (See HPI) and palpitations. Negative for leg swelling.  Gastrointestinal:  Positive for abdominal pain (Reflux).  Genitourinary:  Positive for frequency. Negative for dysuria.  Neurological:  Positive for dizziness, seizures (Not recent) and numbness.  Hematological:  Bruises/bleeds easily (Frequent nosebleeds).  Psychiatric/Behavioral:  Positive for dysphoric mood. The patient is nervous/anxious.   All other systems reviewed and are negative.   BP 134/82 (BP Location: Right Arm, Patient Position: Sitting, Cuff Size: Normal)   Pulse 65   Resp 20   Ht _0  (1.626 m)   Wt 140 lb 8 oz (63.7 kg)   SpO2 94% Comment: RA  BMI 24.12 kg/m  Physical Exam   Diagnostic Tests: CT CHEST WITHOUT CONTRAST LOW-DOSE FOR LUNG CANCER SCREENING   TECHNIQUE: Multidetector CT imaging of the chest was performed following the standard protocol without IV contrast.   RADIATION DOSE REDUCTION: This exam was performed according to the departmental dose-optimization program which includes automated exposure control, adjustment of the mA and/or kV according to patient size and/or use of iterative reconstruction technique.   COMPARISON:  03/09/2021   FINDINGS: Cardiovascular: Aortic atherosclerosis. Normal heart size, without pericardial effusion. Left main, LAD, right coronary artery calcification.  Mediastinum/Nodes: No mediastinal or definite hilar adenopathy, given limitations of unenhanced CT.   Lungs/Pleura: No pleural fluid. Moderate centrilobular and paraseptal emphysema.   A right middle lobe pulmonary nodule of volume derived equivalent diameter 12.0 mm is new or enlarged on 219/4.   A right upper lobe pulmonary nodule of volume derived equivalent diameter 6.8 mm on 89/4 has enlarged from volume derived equivalent diameter 4.4 mm on the  prior.   Other pulmonary nodules are similar at maximally volume derived equivalent diameter 5.6 mm.   Upper Abdomen: Cholecystectomy. Normal imaged portions of the liver, spleen, stomach, pancreas, adrenal glands, right kidney. Upper pole left renal 1.6 cm hypoattenuating lesion is favored to represent a cyst or minimally complex cyst, but is incompletely imaged.   The common duct measures 1.3 cm on 66/2. This was excluded on the prior exam.   Musculoskeletal: Lower cervical and midthoracic spondylosis.   IMPRESSION: 1. Lung-RADS 4B, suspicious. Additional imaging evaluation or consultation with Pulmonology or Thoracic Surgery recommended. Right middle and right upper lobe pulmonary nodules, suspicious for primary bronchogenic carcinoma or carcinomas. Consider PET of the right middle lobe nodule. The right upper lobe nodule is at the low end of PET resolution. 2. No thoracic adenopathy. 3. Age advanced coronary artery atherosclerosis. Recommend assessment of coronary risk factors. 4. Aortic atherosclerosis (ICD10-I70.0) and emphysema (ICD10-J43.9). 5. Common duct dilatation after cholecystectomy, of indeterminate acuity. Consider correlation with bilirubin levels. If elevated, consider MRCP.   These results will be called to the ordering clinician or representative by the Radiologist Assistant, and communication documented in the PACS or [facility] Dashboard.     Electronically Signed   By: Kyle  Talbot M.D.   On: 05/19/2022 14:04 NUCLEAR MEDICINE PET SKULL BASE TO THIGH   TECHNIQUE: 7.6 mCi F-18 FDG was injected intravenously. Full-ring PET imaging was performed from the skull base to thigh after the radiotracer. CT data was obtained and used for attenuation correction and anatomic localization.   Fasting blood glucose: 92 mg/dl   COMPARISON:  Chest CT 04/18/2022   FINDINGS: Mediastinal blood pool activity: SUV max 2.4   Liver activity: SUV max NA   NECK:  Physiologic activity in the glottis, palatine tonsils, and anterior tongue.   Incidental CT findings: Bilateral common carotid atherosclerotic calcification.   CHEST: The medial right middle lobe nodule measuring approximately 1.3 by 0.7 cm on image 71 of series 7 has a maximum SUV of 4.8, high suspicion for malignancy.   The 6 by 4 mm right upper lobe subpleural nodule shown on image 29 series 7 does has hazy surrounding interstitial accentuation as on recent CT, and does not demonstrate differential accentuated activity compared to the faint activity associated with the presumed inflammatory peripheral interstitial accentuation. This lesion is below sensitive PET-CT size thresholds.   A 1.0 cm in short axis lower paratracheal node on image 36 series 7 has a maximum SUV of 2.4, equal to the blood pool.   Incidental CT findings: Centrilobular emphysema. Airway thickening is present, suggesting bronchitis or reactive airways disease. Coronary, aortic arch, and branch vessel atherosclerotic vascular disease.   ABDOMEN/PELVIS: No significant abnormal hypermetabolic activity in this region.   Incidental CT findings: Cholecystectomy. Atherosclerosis is present, including aortoiliac atherosclerotic disease. Nonobstructive bilateral nephrolithiasis. Photopenic left kidney upper pole lesion favoring cyst.   SKELETON: No significant abnormal hypermetabolic activity in this region.   Incidental CT findings: none   IMPRESSION: 1. Hypermetabolic medial right middle lobe nodule, high suspicion for malignancy. 2. The 6   by 4 mm right upper lobe nodule is not discernibly hypermetabolic compared to surrounding tissues, but is below sensitive PET-CT size thresholds. 3. Borderline enlarged right lower paratracheal lymph node has a maximum SUV of 2.4, equal to the blood pool. This tends to favor benign process. 4. Other imaging findings of potential clinical significance:  Aortic Atherosclerosis (ICD10-I70.0) and Emphysema (ICD10-J43.9). Coronary atherosclerosis. Airway thickening is present, suggesting bronchitis or reactive airways disease. Nonobstructive bilateral nephrolithiasis.     Electronically Signed   By: Van Clines M.D.   On: 06/03/2022 10:15 I personally reviewed the CT and PET/CT images.  There is a highly suspicious 1.3 x 8 mm nodule in the right middle lobe.  Peripheral 6 mm nodule in right upper lobe.  Borderline right paratracheal lymph node with background uptake on PET.  6-minute walk test  [facility] 1470 feet, heart rate 67 to 84, oxygen saturation 95% pre-, 94% post Tolerated well  Impression: Vicki Blankenship is a 66 year old woman with a history of tobacco abuse, COPD, type 2 diabetes, hypertension, hyperlipidemia, bipolar disorder, meningioma, head injury, PTSD, seizure history, arthritis, osteopenia, reflux, and depression.  She has a 50-pack-year history of smoking, currently down to 4 cigarettes daily.  Found to have a 1.3 x 8 mm nodule in the right middle lobe on a low-dose screening CT for lung cancer.  There is a second smaller nodule in the right upper lobe.  There nondescript nodules that are unchanged from a screening scan from a year ago.  On PET/CT the middle lobe nodule is hypermetabolic.  The right upper lobe nodule is too small to characterize.  There is a borderline right paratracheal node but that did not have activity on the scan.  Most likely reactive.    I discussed the differential diagnosis of the nodules with Mrs. and Mr. Holberg.  They understand that infectious and inflammatory nodules are in the differential, but this really has to be considered primary bronchogenic carcinoma unless it can be proven otherwise.  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    We discussed potential treatment  options including surgery and radiation.  She strongly favors surgical resection.  My recommendation is that we plan to do a robotic right VATS for wedge resection of the middle and upper lobe nodules and possible middle lobectomy depending on intraoperative frozen section findings.  The upper lobe nodule is small enough and peripheral enough that I do not think a lobectomy would be warranted, nor do I think she would tolerate a bilobectomy.  I do think that we should mark the right upper lobe nodule bronchoscopically, as it is relatively small and may be difficult to find, even though it is subpleural on the scan.  I informed her and her husband the general nature of the procedure including the need for general anesthesia, the incisions to be used, the use of the surgical robot, the use of a drainage tube postoperatively, the expected hospital stay, and the overall recovery.  I informed them of the indications, risks, benefits, and alternatives.  They understand the risks include, but are not limited to death, MI, DVT, PE, bleeding, possible need for transfusion, infection, cardiac arrhythmias, prolonged air leak, chronic pain, as well as possibility of other unforeseeable complications.  She accepts the risk and wishes to proceed.  Pulmonary function testing pending but she did well with her  6-minute walk test  Importance of complete tobacco cessation was emphasized.  Plan:  Pulmonary function testing tomorrow as scheduled  Tentatively plan for navigational bronchoscopy to mark right upper lobe nodule, robotic right VATS for wedge resection of right upper and middle lobe nodules and possible right middle lobectomy on Monday, 07/25/2022  [name], MD [facility] [phone]

## 2022-07-04 NOTE — Progress Notes (Signed)
PCP is Tilda Burrow, NP Referring Provider is Tanda Rockers, MD  Chief Complaint  Patient presents with   Lung Lesion    New patient consultation [facility] 6/29, Chest CT 5/15, PFTs pending    HPI: Vicki Blankenship is sent for consultation regarding right middle and upper lobe lung nodules  Vicki Blankenship is a 66 year old woman with a history of tobacco abuse, COPD, type 2 diabetes, hypertension, hyperlipidemia, bipolar disorder, meningioma, head injury, PTSD, seizure history, arthritis, osteopenia, reflux, and depression.  She moved to [place] in 2021.  She started in the low-dose CT for lung cancer screening program in 2022.  She had a CT which showed small nodules measuring up to 6 mm in the right lower lobe.  She had a follow-up scan in June which showed a new or enlarged 12 mm right middle lobe nodule.  A peripheral right upper lobe nodule increased in size from 4 to 6 mm.  There was a 1 cm lower right paratracheal node.  She had a PET/CT which showed the middle lobe nodule was hypermetabolic with an SUV of 4.8.  The right upper lobe nodule is too small to characterize.  The right paratracheal node was at background levels.  She smoked on average about a pack a day for almost 50 years.  She had cut down to about 1/2 pack/day and currently is smoking about 4 cigarettes/day.  She uses oxygen at night and also when she takes a nap during the day.  She recently started using oxygen for exertion.  She complains of a loss of appetite with a 20 pound weight loss over the past 3 months.  She attributes that to anxiety.  She does suffer from headaches and dizziness.  Has difficulty walking due to pain in her hips.  She complains of right sided chest pain that starts along the costal margin and radiates up through the chest.  It usually lasts less than a minute and can come on at rest or exertion.  She is disabled from work.  Zubrod Score: At the time of surgery this patient's most appropriate activity  status/level should be described as: _0     0    Normal activity, no symptoms _1     1    Restricted in physical strenuous activity but ambulatory, able to do out light work _2     2    Ambulatory and capable of self care, unable to do work activities, up and about >50 % of waking hours                              _3     3    Only limited self care, in bed greater than 50% of waking hours _4     4    Completely disabled, no self care, confined to bed or chair _5     5    Moribund  Past Medical History:  Diagnosis Date   Anxiety    Arthritis    Bipolar disorder (HCC)    COPD (chronic obstructive pulmonary disease) ([place])    Depression    Diabetes ([place])    Type II   Diabetes mellitus, type II ([place])    Dyspnea    GERD (gastroesophageal reflux disease)    Head injury    Headache    migraines- 3 times a week   HLD (hyperlipidemia)    HTN (hypertension)    Lesion of lung 2023  suspect lung cancer per pt   LOC (loss of consciousness) ([place])    Meningioma ([place])    L frontal   Pneumonia    PTSD (post-traumatic stress disorder)    Seizure ([place])    hx of    Past Surgical History:  Procedure Laterality Date   BIOPSY  05/23/2022   Procedure: BIOPSY;  Surgeon: Eloise Harman, DO;  [facility];  Service: Endoscopy;;   CARPAL TUNNEL RELEASE Right    CHOLECYSTECTOMY  2013   COLONOSCOPY  2015   In [place].  Per patient, she had colon polyps.   COLONOSCOPY WITH PROPOFOL N/A 05/23/2022   Procedure: COLONOSCOPY WITH PROPOFOL;  Surgeon: Eloise Harman, DO;  [facility];  Service: Endoscopy;  Laterality: N/A;  11:00am   ESOPHAGOGASTRODUODENOSCOPY (EGD) WITH PROPOFOL N/A 05/23/2022   Procedure: ESOPHAGOGASTRODUODENOSCOPY (EGD) WITH PROPOFOL;  Surgeon: Eloise Harman, DO;  [facility];  Service: Endoscopy;  Laterality: N/A;   FOOT SURGERY Left    LUMBAR LAMINECTOMY/ DECOMPRESSION WITH MET-RX Left 04/14/2020   Procedure: Left Lumbar Four-Five Minimally  invasive lumbar decompression;  Surgeon: Judith Part, MD;  Location: [facility];  Service: Neurosurgery;  Laterality: Left;  Left Lumbar Four-Five Minimally invasive lumbar decompression   POLYPECTOMY  05/23/2022   Procedure: POLYPECTOMY;  Surgeon: Eloise Harman, DO;  [facility];  Service: Endoscopy;;   thumb surgery Left    pin placed    Family History  Problem Relation Age of Onset   Migraines Mother    Diabetes Mother    Dementia Mother    Cancer Father    Heart disease Father    Colon cancer Neg Hx     Social History Social History   Tobacco Use   Smoking status: Every Day    Packs/day: 0.50    Years: 40.00    Total pack years: 20.00    Types: Cigarettes   Smokeless tobacco: Never   Tobacco comments:    Smoking 10 cigarettes per day  Vaping Use   Vaping Use: Former  Substance Use Topics   Alcohol use: Never   Drug use: Never    Current Outpatient Medications  Medication Sig Dispense Refill   aspirin 81 MG chewable tablet Chew 81 mg by mouth daily.     atorvastatin (LIPITOR) 40 MG tablet Take 40 mg by mouth daily.     butalbital-acetaminophen-caffeine (FIORICET) 50-325-40 MG tablet Take 1 tablet by mouth every 4 (four) hours as needed for migraine.     clonazePAM (KLONOPIN) 0.5 MG tablet Take 0.5 mg by mouth 3 (three) times daily.     doxycycline (VIBRA-TABS) 100 MG tablet Take 1 tablet (100 mg total) by mouth 2 (two) times daily. 20 tablet 0   DULoxetine (CYMBALTA) 30 MG capsule Take 30 mg by mouth every morning.     DULoxetine (CYMBALTA) 60 MG capsule Take 60 mg by mouth at bedtime.     Erenumab-aooe (AIMOVIG) 70 MG/ML SOAJ Inject 70 mg into the skin every 30 (thirty) days. 1.12 mL 6   Erenumab-aooe (AIMOVIG) 70 MG/ML SOAJ Inject 70 mg into the skin every 30 (thirty) days. 1.12 mL 0   Glycopyrrolate-Formoterol (BEVESPI AEROSPHERE) 9-4.8 MCG/ACT AERO Take 2 puffs first thing in am and then another 2 puffs about 12 hours later. 1 each 11    hydrochlorothiazide (HYDRODIURIL) 25 MG tablet Take 25 mg by mouth daily.     lithium carbonate 300 MG capsule Take 300  mg by mouth in the morning and at bedtime.     losartan (COZAAR) 25 MG tablet Take 25 mg by mouth daily.     metFORMIN (GLUCOPHAGE) 500 MG tablet Take 500 mg by mouth daily with breakfast.     Multiple Vitamin (MULTIVITAMIN ADULT PO) Take 1 tablet by mouth daily.     naloxone (NARCAN) 4 MG/0.1ML LIQD nasal spray kit Place 1 spray into the nose as needed (opioid overdose).     OXYGEN Inhale 2 L into the lungs at bedtime.     pantoprazole (PROTONIX) 40 MG tablet Take 1 tablet (40 mg total) by mouth daily before breakfast. 90 tablet 3   Phenylephrine-Acetaminophen (TYLENOL SINUS+HEADACHE) 5-325 MG TABS Take 1-2 tablets by mouth 2 (two) times daily as needed (sinus congestion).     predniSONE (DELTASONE) 10 MG tablet Take  4 each am x 2 days,   2 each am x 2 days,  1 each am x 2 days and stop 14 tablet 0   QUEtiapine (SEROQUEL) 400 MG tablet Take 400 mg by mouth at bedtime.     QUEtiapine (SEROQUEL) 50 MG tablet Take 50 mg by mouth every morning.     Rimegepant Sulfate (NURTEC) 75 MG TBDP Take 75 mg by mouth as needed. Max dose one pill in 24 hours 8 tablet 6   Rimegepant Sulfate (NURTEC) 75 MG TBDP Take 75 mg by mouth as needed. 4 tablet 0   vitamin B-12 (CYANOCOBALAMIN) 1000 MCG tablet Take 1,000 mcg by mouth daily.     vitamin C (ASCORBIC ACID) 500 MG tablet Take 500 mg by mouth daily.     Vitamin D, Cholecalciferol, 25 MCG (1000 UT) TABS Take 1,000 Units by mouth daily.     No current facility-administered medications for this visit.    Allergies  Allergen Reactions   Penicillins Swelling    Tongue swelling   Wellbutrin [Bupropion]     shakes    Review of Systems  Constitutional:  Positive for activity change, appetite change, fatigue and unexpected weight change. Negative for chills and fever.  HENT:  Positive for dental problem (Dentures). Negative for trouble  swallowing and voice change.   Respiratory:  Positive for cough, shortness of breath and wheezing.   Cardiovascular:  Positive for chest pain (See HPI) and palpitations. Negative for leg swelling.  Gastrointestinal:  Positive for abdominal pain (Reflux).  Genitourinary:  Positive for frequency. Negative for dysuria.  Neurological:  Positive for dizziness, seizures (Not recent) and numbness.  Hematological:  Bruises/bleeds easily (Frequent nosebleeds).  Psychiatric/Behavioral:  Positive for dysphoric mood. The patient is nervous/anxious.   All other systems reviewed and are negative.   BP 134/82 (BP Location: Right Arm, Patient Position: Sitting, Cuff Size: Normal)   Pulse 65   Resp 20   Ht _0  (1.626 m)   Wt 140 lb 8 oz (63.7 kg)   SpO2 94% Comment: RA  BMI 24.12 kg/m  Physical Exam   Diagnostic Tests: CT CHEST WITHOUT CONTRAST LOW-DOSE FOR LUNG CANCER SCREENING   TECHNIQUE: Multidetector CT imaging of the chest was performed following the standard protocol without IV contrast.   RADIATION DOSE REDUCTION: This exam was performed according to the departmental dose-optimization program which includes automated exposure control, adjustment of the mA and/or kV according to patient size and/or use of iterative reconstruction technique.   COMPARISON:  03/09/2021   FINDINGS: Cardiovascular: Aortic atherosclerosis. Normal heart size, without pericardial effusion. Left main, LAD, right coronary artery calcification.  Mediastinum/Nodes: No mediastinal or definite hilar adenopathy, given limitations of unenhanced CT.   Lungs/Pleura: No pleural fluid. Moderate centrilobular and paraseptal emphysema.   A right middle lobe pulmonary nodule of volume derived equivalent diameter 12.0 mm is new or enlarged on 219/4.   A right upper lobe pulmonary nodule of volume derived equivalent diameter 6.8 mm on 89/4 has enlarged from volume derived equivalent diameter 4.4 mm on the  prior.   Other pulmonary nodules are similar at maximally volume derived equivalent diameter 5.6 mm.   Upper Abdomen: Cholecystectomy. Normal imaged portions of the liver, spleen, stomach, pancreas, adrenal glands, right kidney. Upper pole left renal 1.6 cm hypoattenuating lesion is favored to represent a cyst or minimally complex cyst, but is incompletely imaged.   The common duct measures 1.3 cm on 66/2. This was excluded on the prior exam.   Musculoskeletal: Lower cervical and midthoracic spondylosis.   IMPRESSION: 1. Lung-RADS 4B, suspicious. Additional imaging evaluation or consultation with Pulmonology or Thoracic Surgery recommended. Right middle and right upper lobe pulmonary nodules, suspicious for primary bronchogenic carcinoma or carcinomas. Consider PET of the right middle lobe nodule. The right upper lobe nodule is at the low end of PET resolution. 2. No thoracic adenopathy. 3. Age advanced coronary artery atherosclerosis. Recommend assessment of coronary risk factors. 4. Aortic atherosclerosis (ICD10-I70.0) and emphysema (ICD10-J43.9). 5. Common duct dilatation after cholecystectomy, of indeterminate acuity. Consider correlation with bilirubin levels. If elevated, consider MRCP.   These results will be called to the ordering clinician or representative by the Radiologist Assistant, and communication documented in the PACS or [employer].     Electronically Signed   By: Abigail Miyamoto M.D.   On: 05/19/2022 14:04 NUCLEAR MEDICINE PET SKULL BASE TO THIGH   TECHNIQUE: 7.6 mCi F-18 FDG was injected intravenously. Full-ring PET imaging was performed from the skull base to thigh after the radiotracer. CT data was obtained and used for attenuation correction and anatomic localization.   Fasting blood glucose: 92 mg/dl   COMPARISON:  Chest CT 04/18/2022   FINDINGS: Mediastinal blood pool activity: SUV max 2.4   Liver activity: SUV max NA   NECK:  Physiologic activity in the glottis, palatine tonsils, and anterior tongue.   Incidental CT findings: Bilateral common carotid atherosclerotic calcification.   CHEST: The medial right middle lobe nodule measuring approximately 1.3 by 0.7 cm on image 71 of series 7 has a maximum SUV of 4.8, high suspicion for malignancy.   The 6 by 4 mm right upper lobe subpleural nodule shown on image 29 series 7 does has hazy surrounding interstitial accentuation as on recent CT, and does not demonstrate differential accentuated activity compared to the faint activity associated with the presumed inflammatory peripheral interstitial accentuation. This lesion is below sensitive PET-CT size thresholds.   A 1.0 cm in short axis lower paratracheal node on image 36 series 7 has a maximum SUV of 2.4, equal to the blood pool.   Incidental CT findings: Centrilobular emphysema. Airway thickening is present, suggesting bronchitis or reactive airways disease. Coronary, aortic arch, and branch vessel atherosclerotic vascular disease.   ABDOMEN/PELVIS: No significant abnormal hypermetabolic activity in this region.   Incidental CT findings: Cholecystectomy. Atherosclerosis is present, including aortoiliac atherosclerotic disease. Nonobstructive bilateral nephrolithiasis. Photopenic left kidney upper pole lesion favoring cyst.   SKELETON: No significant abnormal hypermetabolic activity in this region.   Incidental CT findings: none   IMPRESSION: 1. Hypermetabolic medial right middle lobe nodule, high suspicion for malignancy. 2. The 6  by 4 mm right upper lobe nodule is not discernibly hypermetabolic compared to surrounding tissues, but is below sensitive PET-CT size thresholds. 3. Borderline enlarged right lower paratracheal lymph node has a maximum SUV of 2.4, equal to the blood pool. This tends to favor benign process. 4. Other imaging findings of potential clinical significance:  Aortic Atherosclerosis (ICD10-I70.0) and Emphysema (ICD10-J43.9). Coronary atherosclerosis. Airway thickening is present, suggesting bronchitis or reactive airways disease. Nonobstructive bilateral nephrolithiasis.     Electronically Signed   By: Van Clines M.D.   On: 06/03/2022 10:15 I personally reviewed the CT and PET/CT images.  There is a highly suspicious 1.3 x 8 mm nodule in the right middle lobe.  Peripheral 6 mm nodule in right upper lobe.  Borderline right paratracheal lymph node with background uptake on PET.  6-minute walk test  [facility] 1470 feet, heart rate 67 to 84, oxygen saturation 95% pre-, 94% post Tolerated well  Impression: Vicki Blankenship is a 66 year old woman with a history of tobacco abuse, COPD, type 2 diabetes, hypertension, hyperlipidemia, bipolar disorder, meningioma, head injury, PTSD, seizure history, arthritis, osteopenia, reflux, and depression.  She has a 50-pack-year history of smoking, currently down to 4 cigarettes daily.  Found to have a 1.3 x 8 mm nodule in the right middle lobe on a low-dose screening CT for lung cancer.  There is a second smaller nodule in the right upper lobe.  There nondescript nodules that are unchanged from a screening scan from a year ago.  On PET/CT the middle lobe nodule is hypermetabolic.  The right upper lobe nodule is too small to characterize.  There is a borderline right paratracheal node but that did not have activity on the scan.  Most likely reactive.    I discussed the differential diagnosis of the nodules with Mrs. and Mr. Hammer.  They understand that infectious and inflammatory nodules are in the differential, but this really has to be considered primary bronchogenic carcinoma unless it can be proven otherwise.  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    We discussed potential treatment  options including surgery and radiation.  She strongly favors surgical resection.  My recommendation is that we plan to do a robotic right VATS for wedge resection of the middle and upper lobe nodules and possible middle lobectomy depending on intraoperative frozen section findings.  The upper lobe nodule is small enough and peripheral enough that I do not think a lobectomy would be warranted, nor do I think she would tolerate a bilobectomy.  I do think that we should mark the right upper lobe nodule bronchoscopically, as it is relatively small and may be difficult to find, even though it is subpleural on the scan.  I informed her and her husband the general nature of the procedure including the need for general anesthesia, the incisions to be used, the use of the surgical robot, the use of a drainage tube postoperatively, the expected hospital stay, and the overall recovery.  I informed them of the indications, risks, benefits, and alternatives.  They understand the risks include, but are not limited to death, MI, DVT, PE, bleeding, possible need for transfusion, infection, cardiac arrhythmias, prolonged air leak, chronic pain, as well as possibility of other unforeseeable complications.  She accepts the risk and wishes to proceed.  Pulmonary function testing pending but she did well with her  6-minute walk test  Importance of complete tobacco cessation was emphasized.  Plan:  Pulmonary function testing tomorrow as scheduled  Tentatively plan for navigational bronchoscopy to mark right upper lobe nodule, robotic right VATS for wedge resection of right upper and middle lobe nodules and possible right middle lobectomy on Monday, 07/25/2022  [name], MD [facility] [phone]

## 2022-07-04 NOTE — Progress Notes (Signed)
6 Minute Walk Test Results  Patient: Vicki Blankenship Date:  07/04/2022   Supplemental O2 during test? no      Baseline   End  Time   1701    1707 Heartrate  67    84 Dyspnea  no    no Fatigue  no    no O2 sat   95%    94% Blood pressure 134/82    176/91   Patient ambulated at a moderate pace for a total distance of 1470 feet with 0 stops.  Ambulation was limited primarily due to n/a  Overall the test was tolerated well. Patient states she has minimal calf pain while walking.

## 2022-07-05 ENCOUNTER — Ambulatory Visit (HOSPITAL_COMMUNITY)
Admission: RE | Admit: 2022-07-05 | Discharge: 2022-07-05 | Disposition: A | Discharge status: Home / Self Care | Payer: Medicare HMO | Source: Ambulatory Visit | Attending: Internal Medicine | Admitting: Internal Medicine

## 2022-07-05 ENCOUNTER — Encounter: Payer: Self-pay | Admitting: *Deleted

## 2022-07-05 ENCOUNTER — Other Ambulatory Visit: Payer: Self-pay | Admitting: *Deleted

## 2022-07-05 DIAGNOSIS — J449 Chronic obstructive pulmonary disease, unspecified: Secondary | ICD-10-CM | POA: Insufficient documentation

## 2022-07-05 DIAGNOSIS — R918 Other nonspecific abnormal finding of lung field: Secondary | ICD-10-CM

## 2022-07-05 LAB — PULMONARY FUNCTION TEST
DL/VA % pred: 76 %
DL/VA: 3.2 ml/min/mmHg/L
DLCO unc % pred: 75 %
DLCO unc: 14.95 ml/min/mmHg
FEF 25-75 Post: 1.28 L/sec
FEF 25-75 Pre: 0.95 L/sec
FEF2575-%Change-Post: 34 %
FEF2575-%Pred-Post: 58 %
FEF2575-%Pred-Pre: 43 %
FEV1-%Change-Post: 7 %
FEV1-%Pred-Post: 76 %
FEV1-%Pred-Pre: 70 %
FEV1-Post: 1.86 L
FEV1-Pre: 1.73 L
FEV1FVC-%Change-Post: -2 %
FEV1FVC-%Pred-Pre: 86 %
FEV6-%Change-Post: 10 %
FEV6-%Pred-Post: 90 %
FEV6-%Pred-Pre: 82 %
FEV6-Post: 2.79 L
FEV6-Pre: 2.53 L
FEV6FVC-%Change-Post: 0 %
FEV6FVC-%Pred-Post: 100 %
FEV6FVC-%Pred-Pre: 101 %
FVC-%Change-Post: 10 %
FVC-%Pred-Post: 90 %
FVC-%Pred-Pre: 81 %
FVC-Post: 2.88 L
FVC-Pre: 2.6 L
Post FEV1/FVC ratio: 65 %
Post FEV6/FVC ratio: 97 %
Pre FEV1/FVC ratio: 67 %
Pre FEV6/FVC Ratio: 97 %
RV % pred: 171 %
RV: 3.55 L
TLC % pred: 122 %
TLC: 6.17 L

## 2022-07-05 MED ORDER — ALBUTEROL SULFATE (2.5 MG/3ML) 0.083% IN NEBU
2.5000 mg | INHALATION_SOLUTION | Freq: Once | RESPIRATORY_TRACT | Status: AC
  Administered 2022-07-05: 2.5 mg via RESPIRATORY_TRACT

## 2022-07-08 NOTE — Progress Notes (Signed)
Spoke with pt and notified of results per Dr. Wert. Pt verbalized understanding and denied any questions. 

## 2022-07-21 NOTE — Pre-Procedure Instructions (Signed)
Surgical Instructions    Your procedure is scheduled on Monday, August 21st.  Report to [facility] Main Entrance "A" at 05:30 A.M., then check in with the Admitting office.  Call this number if you have problems the morning of surgery:  [phone]   If you have any questions prior to your surgery date call [phone]: Open Monday-Friday 8am-4pm    Remember:  Do not eat or drink after midnight the night before your surgery     Take these medicines the morning of surgery with A SIP OF WATER  atorvastatin (LIPITOR)  clonazePAM (KLONOPIN) DULoxetine (CYMBALTA)  Glycopyrrolate-Formoterol (BEVESPI AEROSPHERE) lithium carbonate pantoprazole (PROTONIX) QUEtiapine (SEROQUEL)   If needed: albuterol (VENTOLIN HFA)- if needed, bring with you on day of surgery Rimegepant Sulfate (NURTEC)   WHAT DO I DO ABOUT MY DIABETES MEDICATION?   Do not take metFORMIN (GLUCOPHAGE)  the morning of surgery.     HOW TO MANAGE YOUR DIABETES BEFORE AND AFTER SURGERY  Why is it important to control my blood sugar before and after surgery? Improving blood sugar levels before and after surgery helps healing and can limit problems. A way of improving blood sugar control is eating a healthy diet by:  Eating less sugar and carbohydrates  Increasing activity/exercise  Talking with your doctor about reaching your blood sugar goals High blood sugars (greater than 180 mg/dL) can raise your risk of infections and slow your recovery, so you will need to focus on controlling your diabetes during the weeks before surgery. Make sure that the doctor who takes care of your diabetes knows about your planned surgery including the date and location.  How do I manage my blood sugar before surgery? Check your blood sugar at least 4 times a day, starting 2 days before surgery, to make sure that the level is not too high or low.  Check your blood sugar the morning of your surgery when you wake up and every 2 hours  until you get to the Short Stay unit.  If your blood sugar is less than 70 mg/dL, you will need to treat for low blood sugar: Do not take insulin. Treat a low blood sugar (less than 70 mg/dL) with  cup of clear juice (cranberry or apple), 4 glucose tablets, OR glucose gel. Recheck blood sugar in 15 minutes after treatment (to make sure it is greater than 70 mg/dL). If your blood sugar is not greater than 70 mg/dL on recheck, call [phone] for further instructions. Report your blood sugar to the short stay nurse when you get to Short Stay.  If you are admitted to the hospital after surgery: Your blood sugar will be checked by the staff and you will probably be given insulin after surgery (instead of oral diabetes medicines) to make sure you have good blood sugar levels. The goal for blood sugar control after surgery is 80-180 mg/dL.    Follow your surgeon's instructions on when to stop Aspirin.  If no instructions were given by your surgeon then you will need to call the office to get those instructions.     As of today, STOP taking any Aspirin (unless otherwise instructed by your surgeon) Aleve, Naproxen, Ibuprofen, Motrin, Advil, Goody's, BC's, all herbal medications, fish oil, and all vitamins.                     Do NOT Smoke (Tobacco/Vaping) for 24 hours prior to your procedure.  If you use a CPAP at night, you may  bring your mask/headgear for your overnight stay.   Contacts, glasses, piercing's, hearing aid's, dentures or partials may not be worn into surgery, please bring cases for these belongings.    For patients admitted to the hospital, discharge time will be determined by your treatment team.   Patients discharged the day of surgery will not be allowed to drive home, and someone needs to stay with them for 24 hours.  SURGICAL WAITING ROOM VISITATION Patients having surgery or a procedure may have no more than 2 support people in the waiting area - these visitors may  rotate.   Children under the age of 31 must have an adult with them who is not the patient. If the patient needs to stay at the hospital during part of their recovery, the visitor guidelines for inpatient rooms apply. Pre-op nurse will coordinate an appropriate time for 1 support person to accompany patient in pre-op.  This support person may not rotate.   Please refer to the [facility] website for the visitor guidelines for Inpatients (after your surgery is over and you are in a regular room).    Special instructions:   [place]- Preparing For Surgery  Before surgery, you can play an important role. Because skin is not sterile, your skin needs to be as free of germs as possible. You can reduce the number of germs on your skin by washing with CHG (chlorahexidine gluconate) Soap before surgery.  CHG is an antiseptic cleaner which kills germs and bonds with the skin to continue killing germs even after washing.    Oral Hygiene is also important to reduce your risk of infection.  Remember - BRUSH YOUR TEETH THE MORNING OF SURGERY WITH YOUR REGULAR TOOTHPASTE  Please do not use if you have an allergy to CHG or antibacterial soaps. If your skin becomes reddened/irritated stop using the CHG.  Do not shave (including legs and underarms) for at least 48 hours prior to first CHG shower. It is OK to shave your face.  Please follow these instructions carefully.   Shower the NIGHT BEFORE SURGERY and the MORNING OF SURGERY  If you chose to wash your hair, wash your hair first as usual with your normal shampoo.  After you shampoo, rinse your hair and body thoroughly to remove the shampoo.  Use CHG Soap as you would any other liquid soap. You can apply CHG directly to the skin and wash gently with a scrungie or a clean washcloth.   Apply the CHG Soap to your body ONLY FROM THE NECK DOWN.  Do not use on open wounds or open sores. Avoid contact with your eyes, ears, mouth and genitals (private parts).  Wash Face and genitals (private parts)  with your normal soap.   Wash thoroughly, paying special attention to the area where your surgery will be performed.  Thoroughly rinse your body with warm water from the neck down.  DO NOT shower/wash with your normal soap after using and rinsing off the CHG Soap.  Pat yourself dry with a CLEAN TOWEL.  Wear CLEAN PAJAMAS to bed the night before surgery  Place CLEAN SHEETS on your bed the night before your surgery  DO NOT SLEEP WITH PETS.   Day of Surgery: Take a shower with CHG soap. Do not wear jewelry or makeup Do not wear lotions, powders, perfumes, or deodorant. Do not shave 48 hours prior to surgery.   Do not bring valuables to the hospital. [facility] is not responsible for any belongings  or valuables. Do not wear nail polish, gel polish, artificial nails, or any other type of covering on natural nails (fingers and toes) If you have artificial nails or gel coating that need to be removed by a nail salon, please have this removed prior to surgery. Artificial nails or gel coating may interfere with anesthesia's ability to adequately monitor your vital signs. Wear Clean/Comfortable clothing the morning of surgery Remember to brush your teeth WITH YOUR REGULAR TOOTHPASTE.   Please read over the following fact sheets that you were given.    If you received a COVID test during your pre-op visit  it is requested that you wear a mask when out in public, stay away from anyone that may not be feeling well and notify your surgeon if you develop symptoms. If you have been in contact with anyone that has tested positive in the last 10 days please notify you surgeon.

## 2022-07-22 ENCOUNTER — Encounter (HOSPITAL_COMMUNITY): Payer: Self-pay

## 2022-07-22 ENCOUNTER — Other Ambulatory Visit: Payer: Self-pay

## 2022-07-22 ENCOUNTER — Encounter (HOSPITAL_COMMUNITY)
Admission: RE | Admit: 2022-07-22 | Discharge: 2022-07-22 | Disposition: A | Discharge status: Home / Self Care | Payer: Medicare HMO | Source: Ambulatory Visit | Attending: Thoracic Surgery (Cardiothoracic Vascular Surgery) | Admitting: Thoracic Surgery (Cardiothoracic Vascular Surgery)

## 2022-07-22 ENCOUNTER — Ambulatory Visit (HOSPITAL_COMMUNITY)
Admission: RE | Admit: 2022-07-22 | Discharge: 2022-07-22 | Disposition: A | Discharge status: Home / Self Care | Payer: Medicare HMO | Source: Ambulatory Visit | Attending: Thoracic Surgery (Cardiothoracic Vascular Surgery) | Admitting: Thoracic Surgery (Cardiothoracic Vascular Surgery)

## 2022-07-22 VITALS — BP 122/73 | HR 63 | Temp 97.8°F | Resp 18 | Ht 64.0 in | Wt 135.9 lb

## 2022-07-22 DIAGNOSIS — Z20822 Contact with and (suspected) exposure to covid-19: Secondary | ICD-10-CM | POA: Insufficient documentation

## 2022-07-22 DIAGNOSIS — I371 Nonrheumatic pulmonary valve insufficiency: Secondary | ICD-10-CM | POA: Insufficient documentation

## 2022-07-22 DIAGNOSIS — R918 Other nonspecific abnormal finding of lung field: Secondary | ICD-10-CM | POA: Insufficient documentation

## 2022-07-22 DIAGNOSIS — R001 Bradycardia, unspecified: Secondary | ICD-10-CM | POA: Insufficient documentation

## 2022-07-22 DIAGNOSIS — Z01818 Encounter for other preprocedural examination: Secondary | ICD-10-CM

## 2022-07-22 DIAGNOSIS — E119 Type 2 diabetes mellitus without complications: Secondary | ICD-10-CM

## 2022-07-22 HISTORY — DX: Other specified chronic obstructive pulmonary disease: J44.89

## 2022-07-22 HISTORY — DX: Atherosclerosis of aorta: I70.0

## 2022-07-22 HISTORY — DX: Anemia, unspecified: D64.9

## 2022-07-22 HISTORY — DX: Chronic obstructive pulmonary disease, unspecified: J44.9

## 2022-07-22 LAB — CBC
HCT: 43.3 % (ref 36.0–46.0)
Hemoglobin: 14.3 g/dL (ref 12.0–15.0)
MCH: 33.1 pg (ref 26.0–34.0)
MCHC: 33 g/dL (ref 30.0–36.0)
MCV: 100.2 fL — ABNORMAL HIGH (ref 80.0–100.0)
Platelets: 271 10*3/uL (ref 150–400)
RBC: 4.32 MIL/uL (ref 3.87–5.11)
RDW: 12.2 % (ref 11.5–15.5)
WBC: 9.8 10*3/uL (ref 4.0–10.5)
nRBC: 0 % (ref 0.0–0.2)

## 2022-07-22 LAB — URINALYSIS, ROUTINE W REFLEX MICROSCOPIC
Bilirubin Urine: NEGATIVE
Glucose, UA: NEGATIVE mg/dL
Hgb urine dipstick: NEGATIVE
Ketones, ur: NEGATIVE mg/dL
Nitrite: NEGATIVE
Protein, ur: NEGATIVE mg/dL
Specific Gravity, Urine: 1.005 (ref 1.005–1.030)
pH: 7 (ref 5.0–8.0)

## 2022-07-22 LAB — COMPREHENSIVE METABOLIC PANEL
ALT: 33 U/L (ref 0–44)
AST: 33 U/L (ref 15–41)
Albumin: 3.7 g/dL (ref 3.5–5.0)
Alkaline Phosphatase: 85 U/L (ref 38–126)
Anion gap: 10 (ref 5–15)
BUN: 5 mg/dL — ABNORMAL LOW (ref 8–23)
CO2: 25 mmol/L (ref 22–32)
Calcium: 10.5 mg/dL — ABNORMAL HIGH (ref 8.9–10.3)
Chloride: 101 mmol/L (ref 98–111)
Creatinine, Ser: 0.72 mg/dL (ref 0.44–1.00)
GFR, Estimated: >60 mL/min (ref >60)
Glucose, Bld: 103 mg/dL — ABNORMAL HIGH (ref 70–99)
Potassium: 3.8 mmol/L (ref 3.5–5.1)
Sodium: 136 mmol/L (ref 135–145)
Total Bilirubin: 0.6 mg/dL (ref 0.3–1.2)
Total Protein: 7.1 g/dL (ref 6.5–8.1)

## 2022-07-22 LAB — BLOOD GAS, ARTERIAL
Acid-Base Excess: 5 mmol/L — ABNORMAL HIGH (ref 0.0–2.0)
Bicarbonate: 31 mmol/L — ABNORMAL HIGH (ref 20.0–28.0)
Drawn by: 58793
O2 Saturation: 97 %
Patient temperature: 37
pCO2 arterial: 50 mmHg — ABNORMAL HIGH (ref 32–48)
pH, Arterial: 7.4 (ref 7.35–7.45)
pO2, Arterial: 77 mmHg — ABNORMAL LOW (ref 83–108)

## 2022-07-22 LAB — APTT: aPTT: 44 seconds — ABNORMAL HIGH (ref 24–36)

## 2022-07-22 LAB — PROTIME-INR
INR: 1.1 (ref 0.8–1.2)
Prothrombin Time: 14.3 seconds (ref 11.4–15.2)

## 2022-07-22 LAB — SARS CORONAVIRUS 2 (TAT 6-24 HRS): SARS Coronavirus 2: NEGATIVE

## 2022-07-22 LAB — SURGICAL PCR SCREEN
MRSA, PCR: NEGATIVE
Staphylococcus aureus: NEGATIVE

## 2022-07-22 LAB — HEMOGLOBIN A1C
Hgb A1c MFr Bld: 5.4 % (ref 4.8–5.6)
Mean Plasma Glucose: 108.28 mg/dL

## 2022-07-22 LAB — GLUCOSE, CAPILLARY: Glucose-Capillary: 97 mg/dL (ref 70–99)

## 2022-07-22 NOTE — Progress Notes (Signed)
PCP - Mallie Snooks, NP Cardiologist - denies  PPM/ICD - denies   Chest x-ray - 07/22/22 EKG - 07/22/22 Stress Test - 2013 per pt ECHO - 2013 Cardiac Cath - 2013- pt states she had this testing done at [facility] in [place] due to a reaction to medication. Pt states she had a reaction to Wellbutrin (syncopal, bradycardic episode.) Pt states everything turned out fine and it all seemed to be related to the medication. No issues since.   Sleep Study - denies  DM- Type 2 Pt unsure what typical fasting levels are because she only "occasionally" checks CBGs  Blood Thinner Instructions: n/a Aspirin Instructions: continue thru day before surgery  ERAS Protcol - no, NPO   COVID TEST- 07/22/22   Anesthesia review: yes, cardiac hx  Patient denies shortness of breath, fever, cough and chest pain at PAT appointment   All instructions explained to the patient, with a verbal understanding of the material. Patient agrees to go over the instructions while at home for a better understanding. Patient also instructed to wear mask in public after being tested for COVID-19. The opportunity to ask questions was provided.

## 2022-07-22 NOTE — Progress Notes (Signed)
Anesthesia Chart Review:  History of cardiac evaluation in 2017 in [place].  She was admitted to [facility] in [place] February 2017 for syncopal episode and bradycardia.  Records in care everywhere reviewed.  Cardiology was consulted during admission.  She was noted to have a prior cardiac catheterization in September 2015 that showed mild nonobstructive disease.  Additional work-up during hospitalization included echocardiogram which was benign, and stress test which was nonischemic.  Per cardiology, her symptoms were felt likely secondary to psychiatric medications.  Patient reported that she was seen by psychiatry and medications were changed, she reports she has not had any recurrence of symptoms since that time.  Current smoker with 50-pack-year history, COPD on supplemental O2 at night and as needed during the day. Found to have a 1.3 x 8 mm nodule in the right middle lobe on a low-dose screening CT for lung cancer.  There was a second smaller nodule in the right upper lobe.  Seen by Dr. Koleen Nimrod and recommended to undergo robotic right VATS for wedge resection of the middle and upper lobe nodules and possible middle lobectomy depending on intraoperative frozen section findings.  Preop labs reviewed, unremarkable.  EKG 07/22/2022: Sinus bradycardia.  Rate 58.  Rightward axis.  CT chest lung cancer screening 04/18/2022: IMPRESSION: 1. Lung-RADS 4B, suspicious. Additional imaging evaluation or consultation with Pulmonology or Thoracic Surgery recommended. Right middle and right upper lobe pulmonary nodules, suspicious for primary bronchogenic carcinoma or carcinomas. Consider PET of the right middle lobe nodule. The right upper lobe nodule is at the low end of PET resolution. 2. No thoracic adenopathy. 3. Age advanced coronary artery atherosclerosis. Recommend assessment of coronary risk factors. 4. Aortic atherosclerosis (ICD10-I70.0) and emphysema (ICD10-J43.9). 5.  Common duct dilatation after cholecystectomy, of indeterminate acuity. Consider correlation with bilirubin levels. If elevated, consider MRCP.  PFT 07/05/2022: FVC-%Pred-Pre % 81  FEV1-%Pred-Pre % 70  FEV1FVC-%Pred-Pre % 86  TLC % pred % 122  RV % pred % 171  DLCO unc % pred % 75    Stress echo 01/21/2016 (Care Everywhere):   Conclusion:  :   Normal Dobutamine stress Echo with normal inotropic and  :   chronotropic effect.   TTE 01/20/2016 (Care Everywhere): -----------  1. This is a complete two-dimensional transthoracic  echocardiogram (2D, M-mode, Doppler and color flow Doppler).  2. The left ventricle size is normal.  3. Left ventricular wall thickness is normal.  4. Overall LV systolic function is normal.  5. LVEF 65%.  6. The right ventricular systolic function is normal.  7. There is trace aortic regurgitation.  8. There is trace mitral regurgitation.  9. Trace/mild (physiologic) pulmonic regurgitation present.  10. Trace tricuspid regurgitation present.  11. No prior reports available for comparison.      Wynonia Musty [facility] Short Stay Center/Anesthesiology Phone [phone] 07/22/2022 12:53 PM

## 2022-07-22 NOTE — Anesthesia Preprocedure Evaluation (Signed)
Anesthesia Evaluation  Patient identified by MRN, date of birth, ID band Patient awake    Reviewed: Allergy & Precautions, NPO status , Patient's Chart, lab work & pertinent test results  Airway Mallampati: II  TM Distance: >3 FB     Dental   Pulmonary shortness of breath, pneumonia, COPD, Patient abstained from smoking., former smoker,    breath sounds clear to auscultation       Cardiovascular hypertension,  Rhythm:Regular Rate:Normal     Neuro/Psych  Headaches, Seizures -,  PSYCHIATRIC DISORDERS  Neuromuscular disease    GI/Hepatic Neg liver ROS, GERD  ,  Endo/Other  diabetes  Renal/GU negative Renal ROS     Musculoskeletal  (+) Arthritis ,   Abdominal   Peds  Hematology   Anesthesia Other Findings   Reproductive/Obstetrics                           Anesthesia Physical Anesthesia Plan  ASA: 3  Anesthesia Plan: General   Post-op Pain Management:    Induction: Intravenous  PONV Risk Score and Plan: Ondansetron, Dexamethasone and Midazolam  Airway Management Planned: Oral ETT  Additional Equipment: Arterial line  Intra-op Plan:   Post-operative Plan: Possible Post-op intubation/ventilation  Informed Consent: I have reviewed the patients History and Physical, chart, labs and discussed the procedure including the risks, benefits and alternatives for the proposed anesthesia with the patient or authorized representative who has indicated his/her understanding and acceptance.     Dental advisory given  Plan Discussed with: CRNA and Anesthesiologist  Anesthesia Plan Comments: (PAT note by Karoline Caldwell, PA-C: History of cardiac evaluation in 2017 in [place].  She was admitted to [facility] in [place] February 2017 for syncopal episode and bradycardia.  Records in care everywhere reviewed.  Cardiology was consulted during admission.  She was noted to have a prior  cardiac catheterization in September 2015 that showed mild nonobstructive disease.  Additional work-up during hospitalization included echocardiogram which was benign, and stress test which was nonischemic.  Per cardiology, her symptoms were felt likely secondary to psychiatric medications.  Patient reported that she was seen by psychiatry and medications were changed, she reports she has not had any recurrence of symptoms since that time.  Current smoker with 50-pack-year history, COPD on supplemental O2 at night and as needed during the day. Found to have a 1.3 x 8 mm nodule in the right middle lobe on a low-dose screening CT for lung cancer.  There was a second smaller nodule in the right upper lobe.  Seen by Dr. Koleen Nimrod and recommended to undergo robotic right VATS for wedge resection of the middle and upper lobe nodules and possible middle lobectomy depending on intraoperative frozen section findings.  Preop labs reviewed, unremarkable.  EKG 07/22/2022: Sinus bradycardia.  Rate 58.  Rightward axis.  CT chest lung cancer screening 04/18/2022: IMPRESSION: 1. Lung-RADS 4B, suspicious. Additional imaging evaluation or consultation with Pulmonology or Thoracic Surgery recommended. Right middle and right upper lobe pulmonary nodules, suspicious for primary bronchogenic carcinoma or carcinomas. Consider PET of the right middle lobe nodule. The right upper lobe nodule is at the low end of PET resolution. 2. No thoracic adenopathy. 3. Age advanced coronary artery atherosclerosis. Recommend assessment of coronary risk factors. 4. Aortic atherosclerosis (ICD10-I70.0) and emphysema (ICD10-J43.9). 5. Common duct dilatation after cholecystectomy, of indeterminate acuity. Consider correlation with bilirubin levels. If elevated, consider MRCP.  PFT 07/05/2022: FVC-%Pred-Pre % 81 FEV1-%Pred-Pre % 70  FEV1FVC-%Pred-Pre % 86 TLC % pred % 122 RV % pred % 171 DLCO unc % pred % 75   Stress echo  01/21/2016 (Care Everywhere):  Conclusion:  : Normal Dobutamine stress Echo with normal inotropic and  :  chronotropic effect.   TTE 01/20/2016 (Care Everywhere): -----------  1. This is a complete two-dimensional transthoracic  echocardiogram (2D, M-mode, Doppler andcolor flow Doppler).  2. The left ventricle size is normal.  3. Left ventricular wall thickness is normal.  4. Overall LV systolic function is normal.  5. LVEF 65%.  6. The right ventricular systolic function is normal.  7. There is trace aortic regurgitation.  8. There is trace mitral regurgitation.  9. Trace/mild (physiologic) pulmonic regurgitation present.  10. Trace tricuspid regurgitation present.  11. No prior reports available for comparison.   )      Anesthesia Quick Evaluation

## 2022-07-25 ENCOUNTER — Inpatient Hospital Stay (HOSPITAL_COMMUNITY): Payer: Medicare HMO | Admitting: Certified Registered"

## 2022-07-25 ENCOUNTER — Inpatient Hospital Stay (HOSPITAL_COMMUNITY): Payer: Medicare HMO

## 2022-07-25 ENCOUNTER — Encounter (HOSPITAL_COMMUNITY): Payer: Self-pay | Admitting: Thoracic Surgery (Cardiothoracic Vascular Surgery)

## 2022-07-25 ENCOUNTER — Encounter (HOSPITAL_COMMUNITY)
Admission: RE | Disposition: A | Discharge status: Home / Self Care | Payer: Self-pay | Source: Home / Self Care | Attending: Thoracic Surgery (Cardiothoracic Vascular Surgery)

## 2022-07-25 ENCOUNTER — Other Ambulatory Visit: Payer: Self-pay

## 2022-07-25 ENCOUNTER — Inpatient Hospital Stay (HOSPITAL_COMMUNITY)
Admission: RE | Admit: 2022-07-25 | Discharge: 2022-07-29 | DRG: 163 | Disposition: A | Discharge status: Home / Self Care | Payer: Medicare HMO | Attending: Thoracic Surgery (Cardiothoracic Vascular Surgery) | Admitting: Thoracic Surgery (Cardiothoracic Vascular Surgery)

## 2022-07-25 DIAGNOSIS — R569 Unspecified convulsions: Secondary | ICD-10-CM | POA: Diagnosis present

## 2022-07-25 DIAGNOSIS — Z8249 Family history of ischemic heart disease and other diseases of the circulatory system: Secondary | ICD-10-CM

## 2022-07-25 DIAGNOSIS — J939 Pneumothorax, unspecified: Secondary | ICD-10-CM | POA: Diagnosis present

## 2022-07-25 DIAGNOSIS — G43909 Migraine, unspecified, not intractable, without status migrainosus: Secondary | ICD-10-CM | POA: Diagnosis present

## 2022-07-25 DIAGNOSIS — I700 Atherosclerosis of aorta: Secondary | ICD-10-CM | POA: Diagnosis present

## 2022-07-25 DIAGNOSIS — F17210 Nicotine dependence, cigarettes, uncomplicated: Secondary | ICD-10-CM | POA: Diagnosis present

## 2022-07-25 DIAGNOSIS — I2510 Atherosclerotic heart disease of native coronary artery without angina pectoris: Secondary | ICD-10-CM | POA: Diagnosis present

## 2022-07-25 DIAGNOSIS — T423X5A Adverse effect of barbiturates, initial encounter: Secondary | ICD-10-CM | POA: Diagnosis present

## 2022-07-25 DIAGNOSIS — Z20822 Contact with and (suspected) exposure to covid-19: Secondary | ICD-10-CM | POA: Diagnosis present

## 2022-07-25 DIAGNOSIS — F05 Delirium due to known physiological condition: Secondary | ICD-10-CM | POA: Diagnosis not present

## 2022-07-25 DIAGNOSIS — G4440 Drug-induced headache, not elsewhere classified, not intractable: Secondary | ICD-10-CM | POA: Diagnosis present

## 2022-07-25 DIAGNOSIS — E119 Type 2 diabetes mellitus without complications: Secondary | ICD-10-CM | POA: Diagnosis present

## 2022-07-25 DIAGNOSIS — Z888 Allergy status to other drugs, medicaments and biological substances status: Secondary | ICD-10-CM

## 2022-07-25 DIAGNOSIS — R911 Solitary pulmonary nodule: Secondary | ICD-10-CM | POA: Diagnosis present

## 2022-07-25 DIAGNOSIS — J9382 Other air leak: Secondary | ICD-10-CM | POA: Diagnosis not present

## 2022-07-25 DIAGNOSIS — C342 Malignant neoplasm of middle lobe, bronchus or lung: Secondary | ICD-10-CM | POA: Diagnosis present

## 2022-07-25 DIAGNOSIS — K219 Gastro-esophageal reflux disease without esophagitis: Secondary | ICD-10-CM | POA: Diagnosis present

## 2022-07-25 DIAGNOSIS — Z7982 Long term (current) use of aspirin: Secondary | ICD-10-CM

## 2022-07-25 DIAGNOSIS — G928 Other toxic encephalopathy: Secondary | ICD-10-CM | POA: Diagnosis not present

## 2022-07-25 DIAGNOSIS — E785 Hyperlipidemia, unspecified: Secondary | ICD-10-CM | POA: Diagnosis present

## 2022-07-25 DIAGNOSIS — Z87891 Personal history of nicotine dependence: Secondary | ICD-10-CM

## 2022-07-25 DIAGNOSIS — R918 Other nonspecific abnormal finding of lung field: Secondary | ICD-10-CM

## 2022-07-25 DIAGNOSIS — C3491 Malignant neoplasm of unspecified part of right bronchus or lung: Secondary | ICD-10-CM | POA: Diagnosis present

## 2022-07-25 DIAGNOSIS — F319 Bipolar disorder, unspecified: Secondary | ICD-10-CM | POA: Diagnosis present

## 2022-07-25 DIAGNOSIS — Z86011 Personal history of benign neoplasm of the brain: Secondary | ICD-10-CM

## 2022-07-25 DIAGNOSIS — M1990 Unspecified osteoarthritis, unspecified site: Secondary | ICD-10-CM

## 2022-07-25 DIAGNOSIS — F1910 Other psychoactive substance abuse, uncomplicated: Secondary | ICD-10-CM | POA: Diagnosis present

## 2022-07-25 DIAGNOSIS — R001 Bradycardia, unspecified: Secondary | ICD-10-CM | POA: Diagnosis present

## 2022-07-25 DIAGNOSIS — Z79899 Other long term (current) drug therapy: Secondary | ICD-10-CM

## 2022-07-25 DIAGNOSIS — Z7984 Long term (current) use of oral hypoglycemic drugs: Secondary | ICD-10-CM

## 2022-07-25 DIAGNOSIS — J438 Other emphysema: Secondary | ICD-10-CM | POA: Diagnosis present

## 2022-07-25 DIAGNOSIS — J432 Centrilobular emphysema: Secondary | ICD-10-CM | POA: Diagnosis present

## 2022-07-25 DIAGNOSIS — M8580 Other specified disorders of bone density and structure, unspecified site: Secondary | ICD-10-CM | POA: Diagnosis present

## 2022-07-25 DIAGNOSIS — I10 Essential (primary) hypertension: Secondary | ICD-10-CM | POA: Diagnosis present

## 2022-07-25 DIAGNOSIS — T4145XA Adverse effect of unspecified anesthetic, initial encounter: Secondary | ICD-10-CM | POA: Diagnosis not present

## 2022-07-25 DIAGNOSIS — Z86010 Personal history of colonic polyps: Secondary | ICD-10-CM

## 2022-07-25 DIAGNOSIS — Z833 Family history of diabetes mellitus: Secondary | ICD-10-CM

## 2022-07-25 DIAGNOSIS — F4310 Post-traumatic stress disorder, unspecified: Secondary | ICD-10-CM | POA: Diagnosis present

## 2022-07-25 DIAGNOSIS — Z880 Allergy status to penicillin: Secondary | ICD-10-CM

## 2022-07-25 DIAGNOSIS — R4182 Altered mental status, unspecified: Secondary | ICD-10-CM | POA: Diagnosis not present

## 2022-07-25 DIAGNOSIS — Z9889 Other specified postprocedural states: Secondary | ICD-10-CM

## 2022-07-25 HISTORY — PX: LYMPH NODE DISSECTION: SHX5087

## 2022-07-25 HISTORY — PX: INTERCOSTAL NERVE BLOCK: SHX5021

## 2022-07-25 HISTORY — PX: VIDEO BRONCHOSCOPY WITH ENDOBRONCHIAL NAVIGATION: SHX6175

## 2022-07-25 LAB — GLUCOSE, CAPILLARY
Glucose-Capillary: 103 mg/dL — ABNORMAL HIGH (ref 70–99)
Glucose-Capillary: 129 mg/dL — ABNORMAL HIGH (ref 70–99)
Glucose-Capillary: 116 mg/dL — ABNORMAL HIGH (ref 70–99)

## 2022-07-25 LAB — RAPID URINE DRUG SCREEN, HOSP PERFORMED
Amphetamines: NOT DETECTED
Barbiturates: POSITIVE — AB
Benzodiazepines: POSITIVE — AB
Cocaine: NOT DETECTED
Opiates: NOT DETECTED
Tetrahydrocannabinol: POSITIVE — AB

## 2022-07-25 LAB — URINALYSIS, ROUTINE W REFLEX MICROSCOPIC
Bilirubin Urine: NEGATIVE
Glucose, UA: NEGATIVE mg/dL
Ketones, ur: NEGATIVE mg/dL
Leukocytes,Ua: NEGATIVE
Nitrite: NEGATIVE
Protein, ur: NEGATIVE mg/dL
RBC / HPF: >50 RBC/hpf — ABNORMAL HIGH (ref 0–5)
Specific Gravity, Urine: >1.046 — ABNORMAL HIGH (ref 1.005–1.030)
pH: 7 (ref 5.0–8.0)

## 2022-07-25 LAB — BLOOD GAS, ARTERIAL
Acid-Base Excess: 10.7 mmol/L — ABNORMAL HIGH (ref 0.0–2.0)
Bicarbonate: 37.2 mmol/L — ABNORMAL HIGH (ref 20.0–28.0)
O2 Saturation: 98 %
Patient temperature: 38.1
pCO2 arterial: 59 mmHg — ABNORMAL HIGH (ref 32–48)
pH, Arterial: 7.41 (ref 7.35–7.45)
pO2, Arterial: 88 mmHg (ref 83–108)

## 2022-07-25 LAB — POCT I-STAT 7, (LYTES, BLD GAS, ICA,H+H)
Acid-Base Excess: 1 mmol/L (ref 0.0–2.0)
Bicarbonate: 31.2 mmol/L — ABNORMAL HIGH (ref 20.0–28.0)
Calcium, Ion: 1.32 mmol/L (ref 1.15–1.40)
HCT: 42 % (ref 36.0–46.0)
Hemoglobin: 14.3 g/dL (ref 12.0–15.0)
O2 Saturation: 97 %
Patient temperature: 35.2
Potassium: 4.2 mmol/L (ref 3.5–5.1)
Sodium: 139 mmol/L (ref 135–145)
TCO2: 33 mmol/L — ABNORMAL HIGH (ref 22–32)
pCO2 arterial: 68.1 mmHg (ref 32–48)
pH, Arterial: 7.26 — ABNORMAL LOW (ref 7.35–7.45)
pO2, Arterial: 103 mmHg (ref 83–108)

## 2022-07-25 LAB — PREPARE RBC (CROSSMATCH)

## 2022-07-25 LAB — ABO/RH: ABO/RH(D): O POS

## 2022-07-25 SURGERY — WEDGE RESECTION, LUNG, ROBOT-ASSISTED, THORACOSCOPIC
Anesthesia: General | Site: Chest | Laterality: Right

## 2022-07-25 MED ORDER — ASPIRIN 81 MG PO CHEW
81.0000 mg | CHEWABLE_TABLET | Freq: Every day | ORAL | Status: DC
  Administered 2022-07-25 – 2022-07-29 (×5): 81 mg via ORAL
  Filled 2022-07-25 (×5): qty 1

## 2022-07-25 MED ORDER — BUPIVACAINE HCL (PF) 0.5 % IJ SOLN
INTRAMUSCULAR | Status: AC
  Filled 2022-07-25: qty 30

## 2022-07-25 MED ORDER — PROPOFOL 10 MG/ML IV BOLUS
INTRAVENOUS | Status: DC | PRN
  Administered 2022-07-25: 150 mg via INTRAVENOUS
  Administered 2022-07-25: 50 mg via INTRAVENOUS

## 2022-07-25 MED ORDER — SENNOSIDES-DOCUSATE SODIUM 8.6-50 MG PO TABS
1.0000 | ORAL_TABLET | Freq: Every day | ORAL | Status: DC
  Administered 2022-07-26 – 2022-07-28 (×2): 1 via ORAL
  Filled 2022-07-25 (×3): qty 1

## 2022-07-25 MED ORDER — ONDANSETRON HCL 4 MG/2ML IJ SOLN: 4.0000 mg | Freq: Four times a day (QID) | INTRAMUSCULAR | Status: DC | PRN

## 2022-07-25 MED ORDER — ACETAMINOPHEN 160 MG/5ML PO SOLN
1000.0000 mg | Freq: Four times a day (QID) | ORAL | Status: DC
  Filled 2022-07-25 (×4): qty 40.6

## 2022-07-25 MED ORDER — ONDANSETRON HCL 4 MG/2ML IJ SOLN
INTRAMUSCULAR | Status: AC
  Filled 2022-07-25: qty 2

## 2022-07-25 MED ORDER — PANTOPRAZOLE SODIUM 40 MG PO TBEC
40.0000 mg | DELAYED_RELEASE_TABLET | Freq: Every day | ORAL | Status: DC
  Administered 2022-07-26 – 2022-07-29 (×4): 40 mg via ORAL
  Filled 2022-07-25 (×5): qty 1

## 2022-07-25 MED ORDER — ENOXAPARIN SODIUM 40 MG/0.4ML IJ SOSY
40.0000 mg | PREFILLED_SYRINGE | Freq: Every day | INTRAMUSCULAR | Status: DC
  Administered 2022-07-26 – 2022-07-29 (×4): 40 mg via SUBCUTANEOUS
  Filled 2022-07-25 (×5): qty 0.4

## 2022-07-25 MED ORDER — FENTANYL CITRATE PF 50 MCG/ML IJ SOSY
25.0000 ug | PREFILLED_SYRINGE | INTRAMUSCULAR | Status: DC | PRN
  Administered 2022-07-26: 25 ug via INTRAVENOUS
  Filled 2022-07-25: qty 1

## 2022-07-25 MED ORDER — EPINEPHRINE PF 1 MG/ML IJ SOLN
INTRAMUSCULAR | Status: AC
  Filled 2022-07-25: qty 1

## 2022-07-25 MED ORDER — ATORVASTATIN CALCIUM 40 MG PO TABS
40.0000 mg | ORAL_TABLET | Freq: Every day | ORAL | Status: DC
  Administered 2022-07-25 – 2022-07-29 (×5): 40 mg via ORAL
  Filled 2022-07-25 (×5): qty 1

## 2022-07-25 MED ORDER — OXYCODONE HCL 5 MG PO TABS
5.0000 mg | ORAL_TABLET | ORAL | Status: DC | PRN
  Administered 2022-07-26 – 2022-07-29 (×14): 5 mg via ORAL
  Filled 2022-07-25 (×9): qty 1
  Filled 2022-07-25: qty 2
  Filled 2022-07-25 (×4): qty 1

## 2022-07-25 MED ORDER — SODIUM CHLORIDE 0.9 % IV SOLN: 10.0000 mL/h | Freq: Once | INTRAVENOUS | Status: DC

## 2022-07-25 MED ORDER — PROPOFOL 10 MG/ML IV BOLUS
INTRAVENOUS | Status: AC
  Filled 2022-07-25: qty 20

## 2022-07-25 MED ORDER — DULOXETINE HCL 30 MG PO CPEP
30.0000 mg | ORAL_CAPSULE | Freq: Every day | ORAL | Status: DC
Start: 2022-07-26 — End: 2022-07-29
  Administered 2022-07-26 – 2022-07-28 (×3): 30 mg via ORAL
  Filled 2022-07-25 (×3): qty 1

## 2022-07-25 MED ORDER — BUPIVACAINE-EPINEPHRINE 0.5% -1:200000 IJ SOLN
INTRAMUSCULAR | Status: AC
  Filled 2022-07-25: qty 1

## 2022-07-25 MED ORDER — VANCOMYCIN HCL IN DEXTROSE 1-5 GM/200ML-% IV SOLN
1000.0000 mg | Freq: Two times a day (BID) | INTRAVENOUS | Status: AC
  Administered 2022-07-25: 1000 mg via INTRAVENOUS
  Filled 2022-07-25: qty 200

## 2022-07-25 MED ORDER — CLONAZEPAM 0.5 MG PO TABS
0.5000 mg | ORAL_TABLET | Freq: Every day | ORAL | Status: DC
  Administered 2022-07-26 – 2022-07-28 (×3): 0.5 mg via ORAL
  Filled 2022-07-25 (×3): qty 1

## 2022-07-25 MED ORDER — ROCURONIUM BROMIDE 10 MG/ML (PF) SYRINGE
PREFILLED_SYRINGE | INTRAVENOUS | Status: AC
  Filled 2022-07-25: qty 10

## 2022-07-25 MED ORDER — ACETAMINOPHEN 500 MG PO TABS
1000.0000 mg | ORAL_TABLET | Freq: Four times a day (QID) | ORAL | Status: DC
  Administered 2022-07-25 – 2022-07-29 (×14): 1000 mg via ORAL
  Filled 2022-07-25 (×14): qty 2

## 2022-07-25 MED ORDER — DEXAMETHASONE SODIUM PHOSPHATE 10 MG/ML IJ SOLN
INTRAMUSCULAR | Status: AC
  Filled 2022-07-25: qty 1

## 2022-07-25 MED ORDER — QUETIAPINE FUMARATE 25 MG PO TABS
50.0000 mg | ORAL_TABLET | Freq: Every morning | ORAL | Status: DC
  Administered 2022-07-26 – 2022-07-29 (×4): 50 mg via ORAL
  Filled 2022-07-25: qty 1
  Filled 2022-07-25: qty 2
  Filled 2022-07-25: qty 1
  Filled 2022-07-25: qty 2

## 2022-07-25 MED ORDER — ALBUTEROL SULFATE HFA 108 (90 BASE) MCG/ACT IN AERS: 1.0000 | INHALATION_SPRAY | Freq: Four times a day (QID) | RESPIRATORY_TRACT | Status: DC | PRN

## 2022-07-25 MED ORDER — GLYCOPYRROLATE 0.2 MG/ML IJ SOLN
INTRAMUSCULAR | Status: DC | PRN
  Administered 2022-07-25: .2 mg via INTRAVENOUS

## 2022-07-25 MED ORDER — HYDROMORPHONE HCL 1 MG/ML IJ SOLN: 0.2500 mg | INTRAMUSCULAR | Status: DC | PRN

## 2022-07-25 MED ORDER — FENTANYL CITRATE (PF) 250 MCG/5ML IJ SOLN
INTRAMUSCULAR | Status: DC | PRN
  Administered 2022-07-25: 25 ug via INTRAVENOUS
  Administered 2022-07-25: 150 ug via INTRAVENOUS
  Administered 2022-07-25: 75 ug via INTRAVENOUS

## 2022-07-25 MED ORDER — UMECLIDINIUM BROMIDE 62.5 MCG/ACT IN AEPB
1.0000 | INHALATION_SPRAY | Freq: Every day | RESPIRATORY_TRACT | Status: DC
  Administered 2022-07-26 – 2022-07-29 (×4): 1 via RESPIRATORY_TRACT
  Filled 2022-07-25 (×2): qty 7

## 2022-07-25 MED ORDER — 0.9 % SODIUM CHLORIDE (POUR BTL) OPTIME
TOPICAL | Status: DC | PRN
  Administered 2022-07-25: 1000 mL

## 2022-07-25 MED ORDER — DEXAMETHASONE SODIUM PHOSPHATE 10 MG/ML IJ SOLN
INTRAMUSCULAR | Status: DC | PRN
  Administered 2022-07-25 (×2): 5 mg via INTRAVENOUS

## 2022-07-25 MED ORDER — FENTANYL CITRATE (PF) 250 MCG/5ML IJ SOLN
INTRAMUSCULAR | Status: AC
  Filled 2022-07-25: qty 5

## 2022-07-25 MED ORDER — DULOXETINE HCL 30 MG PO CPEP: 30.0000 mg | ORAL_CAPSULE | Freq: Every day | ORAL | Status: DC

## 2022-07-25 MED ORDER — ONDANSETRON HCL 4 MG/2ML IJ SOLN
INTRAMUSCULAR | Status: DC | PRN
  Administered 2022-07-25: 4 mg via INTRAVENOUS

## 2022-07-25 MED ORDER — SUGAMMADEX SODIUM 200 MG/2ML IV SOLN
INTRAVENOUS | Status: DC | PRN
  Administered 2022-07-25: 100 mg via INTRAVENOUS
  Administered 2022-07-25: 200 mg via INTRAVENOUS

## 2022-07-25 MED ORDER — MIDAZOLAM HCL 2 MG/2ML IJ SOLN
INTRAMUSCULAR | Status: DC | PRN
  Administered 2022-07-25: 2 mg via INTRAVENOUS

## 2022-07-25 MED ORDER — CLONAZEPAM 0.5 MG PO TABS: 0.5000 mg | ORAL_TABLET | Freq: Three times a day (TID) | ORAL | Status: DC | PRN

## 2022-07-25 MED ORDER — ARFORMOTEROL TARTRATE 15 MCG/2ML IN NEBU
15.0000 ug | INHALATION_SOLUTION | Freq: Two times a day (BID) | RESPIRATORY_TRACT | Status: DC
  Administered 2022-07-25 – 2022-07-29 (×8): 15 ug via RESPIRATORY_TRACT
  Filled 2022-07-25 (×8): qty 2

## 2022-07-25 MED ORDER — IOHEXOL 350 MG/ML SOLN
100.0000 mL | Freq: Once | INTRAVENOUS | Status: AC | PRN
  Administered 2022-07-25: 100 mL via INTRAVENOUS

## 2022-07-25 MED ORDER — BISACODYL 5 MG PO TBEC
10.0000 mg | DELAYED_RELEASE_TABLET | Freq: Every day | ORAL | Status: DC
  Administered 2022-07-25 – 2022-07-27 (×3): 10 mg via ORAL
  Filled 2022-07-25 (×4): qty 2

## 2022-07-25 MED ORDER — BUTALBITAL-APAP-CAFFEINE 50-325-40 MG PO TABS: 1.0000 | ORAL_TABLET | ORAL | Status: DC | PRN

## 2022-07-25 MED ORDER — INDOCYANINE GREEN 25 MG IV SOLR
INTRAVENOUS | Status: AC
  Filled 2022-07-25: qty 10

## 2022-07-25 MED ORDER — ROCURONIUM BROMIDE 10 MG/ML (PF) SYRINGE
PREFILLED_SYRINGE | INTRAVENOUS | Status: DC | PRN
  Administered 2022-07-25 (×3): 20 mg via INTRAVENOUS
  Administered 2022-07-25 (×2): 50 mg via INTRAVENOUS

## 2022-07-25 MED ORDER — DULOXETINE HCL 60 MG PO CPEP
60.0000 mg | ORAL_CAPSULE | Freq: Every morning | ORAL | Status: DC
  Administered 2022-07-26 – 2022-07-29 (×4): 60 mg via ORAL
  Filled 2022-07-25 (×5): qty 1

## 2022-07-25 MED ORDER — METHYLENE BLUE 1 % INJ SOLN
INTRAVENOUS | Status: DC | PRN
  Administered 2022-07-25: 10 mL

## 2022-07-25 MED ORDER — BUPIVACAINE HCL (PF) 0.5 % IJ SOLN
INTRAMUSCULAR | Status: DC | PRN
  Administered 2022-07-25: 100 mL

## 2022-07-25 MED ORDER — CHLORHEXIDINE GLUCONATE 0.12 % MT SOLN
15.0000 mL | Freq: Once | OROMUCOSAL | Status: AC
  Administered 2022-07-25: 15 mL via OROMUCOSAL
  Filled 2022-07-25: qty 15

## 2022-07-25 MED ORDER — LITHIUM CARBONATE 300 MG PO CAPS
300.0000 mg | ORAL_CAPSULE | Freq: Two times a day (BID) | ORAL | Status: DC
  Administered 2022-07-25 – 2022-07-29 (×7): 300 mg via ORAL
  Filled 2022-07-25 (×10): qty 1

## 2022-07-25 MED ORDER — ORAL CARE MOUTH RINSE: 15.0000 mL | Freq: Once | OROMUCOSAL | Status: AC

## 2022-07-25 MED ORDER — CHLORHEXIDINE GLUCONATE CLOTH 2 % EX PADS
6.0000 | MEDICATED_PAD | Freq: Every day | CUTANEOUS | Status: DC
  Administered 2022-07-26 – 2022-07-27 (×2): 6 via TOPICAL

## 2022-07-25 MED ORDER — LACTATED RINGERS IV SOLN: INTRAVENOUS | Status: DC

## 2022-07-25 MED ORDER — BUPIVACAINE LIPOSOME 1.3 % IJ SUSP
INTRAMUSCULAR | Status: DC | PRN
  Administered 2022-07-25: 100 mL

## 2022-07-25 MED ORDER — INDOCYANINE GREEN 25 MG IV SOLR
INTRAVENOUS | Status: DC | PRN
  Administered 2022-07-25: 25 mg

## 2022-07-25 MED ORDER — MIDAZOLAM HCL 2 MG/2ML IJ SOLN
INTRAMUSCULAR | Status: AC
  Filled 2022-07-25: qty 2

## 2022-07-25 MED ORDER — VANCOMYCIN HCL IN DEXTROSE 1-5 GM/200ML-% IV SOLN
1000.0000 mg | INTRAVENOUS | Status: AC
  Administered 2022-07-25: 1000 mg via INTRAVENOUS
  Filled 2022-07-25: qty 200

## 2022-07-25 MED ORDER — BUPIVACAINE LIPOSOME 1.3 % IJ SUSP
INTRAMUSCULAR | Status: AC
  Filled 2022-07-25: qty 20

## 2022-07-25 MED ORDER — KETOROLAC TROMETHAMINE 15 MG/ML IJ SOLN
15.0000 mg | Freq: Four times a day (QID) | INTRAMUSCULAR | Status: AC
  Administered 2022-07-25 – 2022-07-27 (×8): 15 mg via INTRAVENOUS
  Filled 2022-07-25 (×8): qty 1

## 2022-07-25 MED ORDER — LIDOCAINE 2% (20 MG/ML) 5 ML SYRINGE
INTRAMUSCULAR | Status: AC
  Filled 2022-07-25: qty 5

## 2022-07-25 MED ORDER — PHENYLEPHRINE 80 MCG/ML (10ML) SYRINGE FOR IV PUSH (FOR BLOOD PRESSURE SUPPORT)
PREFILLED_SYRINGE | INTRAVENOUS | Status: DC | PRN
  Administered 2022-07-25: 160 ug via INTRAVENOUS

## 2022-07-25 MED ORDER — LIDOCAINE 2% (20 MG/ML) 5 ML SYRINGE
INTRAMUSCULAR | Status: DC | PRN
  Administered 2022-07-25: 60 mg via INTRAVENOUS

## 2022-07-25 MED ORDER — QUETIAPINE FUMARATE 100 MG PO TABS
400.0000 mg | ORAL_TABLET | Freq: Every day | ORAL | Status: DC
  Administered 2022-07-26 – 2022-07-28 (×3): 400 mg via ORAL
  Filled 2022-07-25 (×3): qty 4

## 2022-07-25 MED ORDER — METHYLENE BLUE 1 % INJ SOLN
INTRAVENOUS | Status: AC
Start: 2022-07-25 — End: ?
  Filled 2022-07-25: qty 10

## 2022-07-25 MED ORDER — ALBUTEROL SULFATE (2.5 MG/3ML) 0.083% IN NEBU
2.5000 mg | INHALATION_SOLUTION | Freq: Four times a day (QID) | RESPIRATORY_TRACT | Status: DC | PRN
Start: 2022-07-25 — End: 2022-07-29

## 2022-07-25 MED ORDER — INSULIN ASPART 100 UNIT/ML IJ SOLN
0.0000 [IU] | Freq: Three times a day (TID) | INTRAMUSCULAR | Status: DC
  Administered 2022-07-26 – 2022-07-27 (×4): 2 [IU] via SUBCUTANEOUS

## 2022-07-25 SURGICAL SUPPLY — 144 items
ADAPTER BRONCHOSCOPE OLYMPUS (ADAPTER) ×3 IMPLANT
ADAPTER VALVE BIOPSY EBUS (MISCELLANEOUS) IMPLANT
ADH SKN CLS APL DERMABOND .7 (GAUZE/BANDAGES/DRESSINGS) ×4
ADPR BSCP OLMPS EDG (ADAPTER) ×2
ADPTR VALVE BIOPSY EBUS (MISCELLANEOUS) ×2
APPLIER CLIP ROT 10 11.4 M/L (STAPLE)
APR CLP MED LRG 11.4X10 (STAPLE)
BAG SPEC RTRVL C125 8X14 (MISCELLANEOUS) ×4
BLADE CLIPPER SURG (BLADE) ×3 IMPLANT
BNDG COHESIVE 6X5 TAN STRL LF (GAUZE/BANDAGES/DRESSINGS) IMPLANT
BRUSH BIOPSY BRONCH 10 SDTNB (MISCELLANEOUS) IMPLANT
BRUSH SUPERTRAX BIOPSY (INSTRUMENTS) IMPLANT
BRUSH SUPERTRAX NDL-TIP CYTO (INSTRUMENTS) ×3 IMPLANT
CANISTER SUCT 3000ML PPV (MISCELLANEOUS) ×6 IMPLANT
CANNULA REDUC XI 12-8 STAPL (CANNULA) ×4
CANNULA REDUCER 12-8 DVNC XI (CANNULA) ×6 IMPLANT
CLIP APPLIE ROT 10 11.4 M/L (STAPLE) IMPLANT
CLIP VESOCCLUDE MED 6/CT (CLIP) IMPLANT
CNTNR URN SCR LID CUP LEK RST (MISCELLANEOUS) ×15 IMPLANT
CONN ST 1/4X3/8  BEN (MISCELLANEOUS)
CONN ST 1/4X3/8 BEN (MISCELLANEOUS) IMPLANT
CONT SPEC 4OZ STRL OR WHT (MISCELLANEOUS) ×30
COVER BACK TABLE 60X90IN (DRAPES) ×3 IMPLANT
DEFOGGER SCOPE WARMER CLEARIFY (MISCELLANEOUS) ×3 IMPLANT
DERMABOND ADVANCED (GAUZE/BANDAGES/DRESSINGS) ×4
DERMABOND ADVANCED .7 DNX12 (GAUZE/BANDAGES/DRESSINGS) ×3 IMPLANT
DRAIN CHANNEL 28F RND 3/8 FF (WOUND CARE) IMPLANT
DRAIN CHANNEL 32F RND 10.7 FF (WOUND CARE) IMPLANT
DRAPE ARM DVNC X/XI (DISPOSABLE) ×12 IMPLANT
DRAPE COLUMN DVNC XI (DISPOSABLE) ×3 IMPLANT
DRAPE CV SPLIT W-CLR ANES SCRN (DRAPES) ×3 IMPLANT
DRAPE DA VINCI XI ARM (DISPOSABLE) ×8
DRAPE DA VINCI XI COLUMN (DISPOSABLE) ×2
DRAPE HALF SHEET 40X57 (DRAPES) ×3 IMPLANT
DRAPE INCISE IOBAN 66X45 STRL (DRAPES) IMPLANT
DRAPE ORTHO SPLIT 77X108 STRL (DRAPES) ×2
DRAPE SURG ORHT 6 SPLT 77X108 (DRAPES) ×3 IMPLANT
ELECT BLADE 6.5 EXT (BLADE) IMPLANT
ELECT REM PT RETURN 9FT ADLT (ELECTROSURGICAL) ×2
ELECTRODE REM PT RTRN 9FT ADLT (ELECTROSURGICAL) ×3 IMPLANT
FILTER STRAW FLUID ASPIR (MISCELLANEOUS) ×3 IMPLANT
FORCEPS BIOP SUPERTRX PREMAR (INSTRUMENTS) IMPLANT
GAUZE 4X4 16PLY ~~LOC~~+RFID DBL (SPONGE) ×3 IMPLANT
GAUZE KITTNER 4X5 RF (MISCELLANEOUS) ×6 IMPLANT
GAUZE SPONGE 4X4 12PLY STRL (GAUZE/BANDAGES/DRESSINGS) ×3 IMPLANT
GLOVE BIO SURGEON STRL SZ 6.5 (GLOVE) IMPLANT
GLOVE SURG MICRO LTX SZ7.5 (GLOVE) ×6 IMPLANT
GLOVE SURG SIGNA 7.5 PF LTX (GLOVE) ×3 IMPLANT
GOWN STRL REUS W/ TWL LRG LVL3 (GOWN DISPOSABLE) ×6 IMPLANT
GOWN STRL REUS W/ TWL XL LVL3 (GOWN DISPOSABLE) ×6 IMPLANT
GOWN STRL REUS W/TWL 2XL LVL3 (GOWN DISPOSABLE) ×3 IMPLANT
GOWN STRL REUS W/TWL LRG LVL3 (GOWN DISPOSABLE) ×4
GOWN STRL REUS W/TWL XL LVL3 (GOWN DISPOSABLE) ×4
HEMOSTAT SURGICEL 2X14 (HEMOSTASIS) ×9 IMPLANT
IRRIGATION STRYKERFLOW (MISCELLANEOUS) ×3 IMPLANT
IRRIGATOR STRYKERFLOW (MISCELLANEOUS) ×2
KIT BASIN OR (CUSTOM PROCEDURE TRAY) ×3 IMPLANT
KIT CLEAN ENDO COMPLIANCE (KITS) ×3 IMPLANT
KIT ILLUMISITE 180 PROCEDURE (KITS) IMPLANT
KIT ILLUMISITE 90 PROCEDURE (KITS) IMPLANT
KIT SUCTION CATH 14FR (SUCTIONS) IMPLANT
KIT TURNOVER KIT B (KITS) ×3 IMPLANT
MARKER SKIN DUAL TIP RULER LAB (MISCELLANEOUS) ×3 IMPLANT
NDL HYPO 25GX1X1/2 BEV (NEEDLE) ×3 IMPLANT
NDL SPNL 22GX3.5 QUINCKE BK (NEEDLE) ×3 IMPLANT
NDL SUPERTRX PREMARK BIOPSY (NEEDLE) IMPLANT
NEEDLE HYPO 25GX1X1/2 BEV (NEEDLE) ×2 IMPLANT
NEEDLE SPNL 22GX3.5 QUINCKE BK (NEEDLE) ×2 IMPLANT
NEEDLE SUPERTRX PREMARK BIOPSY (NEEDLE) IMPLANT
NS IRRIG 1000ML POUR BTL (IV SOLUTION) ×3 IMPLANT
OIL SILICONE PENTAX (PARTS (SERVICE/REPAIRS)) ×3 IMPLANT
PACK CHEST (CUSTOM PROCEDURE TRAY) ×3 IMPLANT
PAD ARMBOARD 7.5X6 YLW CONV (MISCELLANEOUS) ×6 IMPLANT
PATCHES PATIENT (LABEL) ×9 IMPLANT
PORT ACCESS TROCAR AIRSEAL 12 (TROCAR) ×3 IMPLANT
PORT ACCESS TROCAR AIRSEAL 5M (TROCAR) ×2
RELOAD STAPLE 45 2.5 WHT DVNC (STAPLE) IMPLANT
RELOAD STAPLE 45 3.5 BLU DVNC (STAPLE) IMPLANT
RELOAD STAPLE 45 4.3 GRN DVNC (STAPLE) IMPLANT
RELOAD STAPLER 2.5X45 WHT DVNC (STAPLE) ×6 IMPLANT
RELOAD STAPLER 3.5X45 BLU DVNC (STAPLE) ×18 IMPLANT
RELOAD STAPLER 4.3X45 GRN DVNC (STAPLE) ×8 IMPLANT
SCISSORS LAP 5X35 DISP (ENDOMECHANICALS) IMPLANT
SEAL CANN UNIV 5-8 DVNC XI (MISCELLANEOUS) ×6 IMPLANT
SEAL XI 5MM-8MM UNIVERSAL (MISCELLANEOUS) ×4
SEALANT PROGEL (MISCELLANEOUS) IMPLANT
SET TRI-LUMEN FLTR TB AIRSEAL (TUBING) ×3 IMPLANT
SOLUTION ELECTROLUBE (MISCELLANEOUS) ×3 IMPLANT
SPONGE INTESTINAL PEANUT (DISPOSABLE) IMPLANT
SPONGE T-LAP 18X18 ~~LOC~~+RFID (SPONGE) ×12 IMPLANT
SPONGE T-LAP 4X18 ~~LOC~~+RFID (SPONGE) ×3 IMPLANT
SPONGE TONSIL 1.25 RF SGL STRG (GAUZE/BANDAGES/DRESSINGS) IMPLANT
SPONGE TONSIL TAPE 1 RFD (DISPOSABLE) IMPLANT
STAPLER 45 SUREFORM CVD (STAPLE) ×4
STAPLER 45 SUREFORM CVD DVNC (STAPLE) IMPLANT
STAPLER CANNULA SEAL DVNC XI (STAPLE) ×6 IMPLANT
STAPLER CANNULA SEAL XI (STAPLE) ×4
STAPLER RELOAD 2.5X45 WHITE (STAPLE) ×6
STAPLER RELOAD 2.5X45 WHT DVNC (STAPLE) ×6
STAPLER RELOAD 3.5X45 BLU DVNC (STAPLE) ×18
STAPLER RELOAD 3.5X45 BLUE (STAPLE) ×18
STAPLER RELOAD 4.3X45 GREEN (STAPLE) ×8
STAPLER RELOAD 4.3X45 GRN DVNC (STAPLE) ×8
SUT PDS AB 3-0 SH 27 (SUTURE) IMPLANT
SUT PROLENE 4 0 RB 1 (SUTURE)
SUT PROLENE 4-0 RB1 .5 CRCL 36 (SUTURE) IMPLANT
SUT SILK  1 MH (SUTURE) ×4
SUT SILK 1 MH (SUTURE) ×6 IMPLANT
SUT SILK 1 TIES 10X30 (SUTURE) IMPLANT
SUT SILK 2 0 SH (SUTURE) IMPLANT
SUT SILK 2 0SH CR/8 30 (SUTURE) IMPLANT
SUT SILK 3 0SH CR/8 30 (SUTURE) IMPLANT
SUT VIC AB 1 CTX 36 (SUTURE) ×2
SUT VIC AB 1 CTX36XBRD ANBCTR (SUTURE) IMPLANT
SUT VIC AB 2-0 CTX 36 (SUTURE) IMPLANT
SUT VIC AB 3-0 MH 27 (SUTURE) IMPLANT
SUT VIC AB 3-0 X1 27 (SUTURE) ×6 IMPLANT
SUT VICRYL 0 TIES 12 18 (SUTURE) ×3 IMPLANT
SUT VICRYL 0 UR6 27IN ABS (SUTURE) ×6 IMPLANT
SUT VICRYL 2 TP 1 (SUTURE) IMPLANT
SYR 20ML ECCENTRIC (SYRINGE) ×3 IMPLANT
SYR 20ML LL LF (SYRINGE) ×6 IMPLANT
SYR 30ML LL (SYRINGE) IMPLANT
SYR 50ML LL SCALE MARK (SYRINGE) ×3 IMPLANT
SYR 5ML LL (SYRINGE) ×3 IMPLANT
SYR TB 1ML LUER SLIP (SYRINGE) IMPLANT
SYSTEM RETRIEVAL ANCHOR 8 (MISCELLANEOUS) IMPLANT
SYSTEM SAHARA CHEST DRAIN ATS (WOUND CARE) ×3 IMPLANT
TAPE CLOTH 4X10 WHT NS (GAUZE/BANDAGES/DRESSINGS) ×3 IMPLANT
TAPE PAPER 2X10 WHT MICROPORE (GAUZE/BANDAGES/DRESSINGS) IMPLANT
TIP APPLICATOR SPRAY EXTEND 16 (VASCULAR PRODUCTS) IMPLANT
TOWEL GREEN STERILE (TOWEL DISPOSABLE) ×6 IMPLANT
TOWEL GREEN STERILE FF (TOWEL DISPOSABLE) ×3 IMPLANT
TRAP SPECIMEN MUCUS 40CC (MISCELLANEOUS) ×3 IMPLANT
TRAY FOLEY MTR SLVR 16FR STAT (SET/KITS/TRAYS/PACK) ×3 IMPLANT
TROCAR BLADELESS 15MM (ENDOMECHANICALS) IMPLANT
TROCAR XCEL 12X100 BLDLESS (ENDOMECHANICALS) IMPLANT
TROCAR XCEL BLADELESS 5X75MML (TROCAR) IMPLANT
TUBE CONNECTING 20X1/4 (TUBING) ×6 IMPLANT
UNDERPAD 30X36 HEAVY ABSORB (UNDERPADS AND DIAPERS) ×3 IMPLANT
VALVE BIOPSY  SINGLE USE (MISCELLANEOUS) ×2
VALVE BIOPSY SINGLE USE (MISCELLANEOUS) ×3 IMPLANT
VALVE SUCTION BRONCHIO DISP (MISCELLANEOUS) ×3 IMPLANT
WATER STERILE IRR 1000ML POUR (IV SOLUTION) ×3 IMPLANT

## 2022-07-25 NOTE — Anesthesia Procedure Notes (Signed)
Arterial Line Insertion Start/End8/21/2023 7:00 AM, 07/25/2022 7:20 AM Performed by: CRNA  Preanesthetic checklist: patient identified, IV checked, site marked, risks and benefits discussed, surgical consent, monitors and equipment checked, pre-op evaluation and timeout performed Lidocaine 1% used for infiltration and patient sedated Right, radial was placed Catheter size: 20 G Hand hygiene performed , maximum sterile barriers used  and Seldinger technique used Allen's test indicative of satisfactory collateral circulation Attempts: 2 Procedure performed without using ultrasound guided technique. Following insertion, Biopatch and dressing applied. Post procedure assessment: normal  Post procedure complications: unsuccessful attempts and second provider assisted. Additional procedure comments: 2 unsuccessful attempts on left side, good arterial flow noted and wire passed easily but catheter would not thread off.  Moved to right side with no issues.  Arterial line by Caryl Comes, CRNA.

## 2022-07-25 NOTE — Interval H&P Note (Signed)
History and Physical Interval Note:  PFTs OK   07/25/2022 7:10 AM  Vicki Blankenship  has presented today for surgery, with the diagnosis of Right middle and upper lobe nodules.  The various methods of treatment have been discussed with the patient and family. After consideration of risks, benefits and other options for treatment, the patient has consented to  Procedure(s): XI ROBOTIC ASSISTED THORACOSCOPY-RIGHT MIDDLE AND UPPER LOBE WEDGE RESECTION, POSSIBLE LOBECTOMY (Right) VIDEO BRONCHOSCOPY WITH ENDOBRONCHIAL NAVIGATION (N/A) as a surgical intervention.  The patient's history has been reviewed, patient examined, no change in status, stable for surgery.  I have reviewed the patient's chart and labs.  Questions were answered to the patient's satisfaction.     [name]

## 2022-07-25 NOTE — Progress Notes (Signed)
Anesthesiologist f/o with other coworkers and reported patient was disoriented prior to surgery attempting to pull lines out.  Will continue to monitor mental status.

## 2022-07-25 NOTE — Progress Notes (Signed)
Anesthesiologist called at 2pm, 2:20 and 2: 25 about Mental status. Was reported that she was " slow" but she is not able to tell me her birthday, where she I, why she is here.  Nor todays month or year. Neurological as far as limbs, facial are intact, PEERLA, unable to distinct difference between numbers. Dr. Nyoka Cowden came by and no new orders were written at this time, 1445.

## 2022-07-25 NOTE — Anesthesia Postprocedure Evaluation (Signed)
Anesthesia Post Note  Patient: Adasia Hoar [facility]  Procedure(s) Performed: XI ROBOTIC ASSISTED THORACOSCOPY-RIGHT MIDDLE AND UPPER LOBE WEDGE RESECTION, RIGHT MIDDLE LOBECTOMY (Right: Chest) VIDEO BRONCHOSCOPY WITH ENDOBRONCHIAL NAVIGATION INTERCOSTAL NERVE BLOCK (Right: Chest) LYMPH NODE DISSECTION (Right: Chest)     Patient location during evaluation: PACU Anesthesia Type: General Level of consciousness: awake Pain management: pain level controlled Respiratory status: spontaneous breathing Cardiovascular status: stable Postop Assessment: no apparent nausea or vomiting Anesthetic complications: no   No notable events documented.  Last Vitals:  Vitals:   07/25/22 1545 07/25/22 1600  BP: (!) 150/74 (!) 153/90  Pulse: 68 66  Resp: 19 (!) 6  Temp:  37.1 C  SpO2: 95% 93%    Last Pain:  Vitals:   07/25/22 1600  TempSrc:   PainSc: Asleep                 Kaliana Albino

## 2022-07-25 NOTE — Significant Event (Signed)
Rapid Response Event Note   Reason for Call :  Increasing confusion t/o shift.  Per RN, pt was confused after her VATS procedure today. At that time, it was confirmed that pt was confusion prior to procedure as well. This confusion worsened t/o the shift and so RRT was called.   Initial Focused Assessment:  Pt lying in bed with eyes open, in no visible distress. She knows her first name but not her last. She knows she's in the hospital but doesn't know why. She is having difficulty finding words/answering questions during exam. NIH-3 for LOC questions and aphasia, LSN-prior to surgery this AM. Pupils 3, equal, and reactive. Skin warm to touch.  T-99(O), 100.7(R), HR-57, BP-132/85, RR-16, SpO2-98% on 3L [place].    Interventions:  CBG-116 Code stroke initiated-see Code Stroke note for details. Plan of Care:  Pt took fioricet prior to surgery this AM. ? Cause of confusion. However will obtain MRI/EEG to r/o seizure/stroke. CTH/CTA/CTP all negative.  VS/neuro checks q4h Event Summary:   MD Notified: Dr. Mayford Knife), bedside RN to notify PCP of happenings.  Call Time:1931  Arrival ANVB:1660 End Time:2100  Dillard Essex, RN

## 2022-07-25 NOTE — Transfer of Care (Signed)
Immediate Anesthesia Transfer of Care Note  Patient: Percilla Tweten [facility]  Procedure(s) Performed: XI ROBOTIC ASSISTED THORACOSCOPY-RIGHT MIDDLE AND UPPER LOBE WEDGE RESECTION, RIGHT MIDDLE LOBECTOMY (Right: Chest) VIDEO BRONCHOSCOPY WITH ENDOBRONCHIAL NAVIGATION INTERCOSTAL NERVE BLOCK (Right: Chest) LYMPH NODE DISSECTION (Right: Chest)  Patient Location: PACU  Anesthesia Type:General  Level of Consciousness: awake, alert  and sedated  Airway & Oxygen Therapy: Patient connected to face mask oxygen  Post-op Assessment: Post -op Vital signs reviewed and stable  Post vital signs: stable  Last Vitals:  Vitals Value Taken Time  BP 152/94 07/25/22 1258  Temp    Pulse 62 07/25/22 1300  Resp 20 07/25/22 1300  SpO2 100 % 07/25/22 1300  Vitals shown include unvalidated device data.  Last Pain:  Vitals:   07/25/22 0607  TempSrc: Oral  PainSc: 0-No pain         Complications: No notable events documented.

## 2022-07-25 NOTE — Anesthesia Procedure Notes (Signed)
Procedure Name: Intubation Date/Time: 07/25/2022 8:35 AM  Performed by: Lavell Luster, CRNAPre-anesthesia Checklist: Patient identified, Emergency Drugs available, Suction available, Patient being monitored and Timeout performed Patient Re-evaluated:Patient Re-evaluated prior to induction Oxygen Delivery Method: Circle system utilized Preoxygenation: Pre-oxygenation with 100% oxygen Induction Type: IV induction Ventilation: Mask ventilation without difficulty Laryngoscope Size: Mac and 3 Grade View: Grade I Endobronchial tube: Left and Double lumen EBT and 37 Fr and 35 Fr Number of attempts: 3 Airway Equipment and Method: Stylet Placement Confirmation: ETT inserted through vocal cords under direct vision, breath sounds checked- equal and bilateral and positive ETCO2 Tube secured with: Tape Dental Injury: Teeth and Oropharynx as per pre-operative assessment  Comments: DL x 1 by Everett Graff SRNA with grade I view, attempted to pass 37 Fr DLT vivasight but would not pass easily through cords.  DL x 1 by Valda Lamb with grade I view attempted to pass smaller 35 Fr DLT vivasight and also would not pass.  Pt ventilated and DL x 2 by Henderson Cloud with same grade I view.  Vivasight still would not advance but 35 Fr Covidien shiley passed easily.  Right lung isolated and pt positioned left lateral.  Dr Nyoka Cowden checked placement of DLT again, case progressing.  Pt was stable and sats WNL throughout induction and intubation.  Henderson Cloud, CRNA

## 2022-07-25 NOTE — Code Documentation (Signed)
Code Stroke initiated on pt at 1957 d/t aphasia, LSN-prior to surgery this AM. CBG-116, NIH-3 for LOC questions and aphasia. CTH negative for acute changes, CTA/CTP-no LVO. TNK not given-outside/recent surgery. Plan MRI/EEG, VS/neuro checks q4h.

## 2022-07-25 NOTE — Progress Notes (Signed)
CT Surgery  Notified by [facility] RN that patient has been evaluated by Neurology and studied with CT Brain and  CTangio for altered mental status and code stroke.   CT studies negative for acute CVA  Patient with hx of bipolar / depressive disorder and migraine headaches on multiple drugs.  Postop vitals have been stable with 2 sat 96% on 3 L O2. Min chest tube output. NSR  Blood pressure 125/82, pulse 62, temperature 99 F (37.2 C), temperature source Oral, resp. rate (!) 22, height 5\' 4"  (1.626 m), weight 65.8 kg, SpO2 95 %.   Postop ABG 11am with PCO2 650, PO2 77bicarb 31   Plan- transfer patient to ICU for close observation, repeat ABG, may need BIPAP for  intermittent hypercarbia [pCO2 at 10 am was 65]

## 2022-07-25 NOTE — Anesthesia Procedure Notes (Addendum)
Procedure Name: Intubation Date/Time: 07/25/2022 7:45 AM  Performed by: Lavell Luster, CRNAPre-anesthesia Checklist: Patient identified, Emergency Drugs available, Suction available, Patient being monitored and Timeout performed Patient Re-evaluated:Patient Re-evaluated prior to induction Oxygen Delivery Method: Circle system utilized Preoxygenation: Pre-oxygenation with 100% oxygen Induction Type: IV induction Ventilation: Mask ventilation without difficulty Laryngoscope Size: Mac and 3 Grade View: Grade I Tube type: Oral Tube size: 8.5 mm Number of attempts: 1 Airway Equipment and Method: Stylet Placement Confirmation: ETT inserted through vocal cords under direct vision, positive ETCO2 and breath sounds checked- equal and bilateral Secured at: 21 cm Tube secured with: Tape Dental Injury: Teeth and Oropharynx as per pre-operative assessment

## 2022-07-25 NOTE — Consult Note (Signed)
Neurology Consultation Reason for Consult: AMS Requesting Physician: Modesto Charon  CC: Altered mental status, confusion  History is obtained from: Patient's daughter and soon-to-be daughter-in-law at bedside, chart review, medical staff  HPI: Vicki Blankenship is a 66 y.o. female with a past medical history significant for non-small cell lung cancer diagnosed today during VATS procedure, 20to40-pack-year smoking history, hypertension, hyperlipidemia diabetes, coronary artery disease, COPD, GERD, lumbar radiculopathy, left frontal meningioma, seizures, chronic daily headache with migrainous features, bipolar 1 disorder, PTSD, anxiety/depression on chronic benzos, remote substance abuse.  She has been very anxious about her upcoming procedure which was completed today.  Her husband typically carefully tracks her medications and prepares her pillboxes, however she picked up her Fioricet herself on Friday.  Yesterday when her daughter noticed she was acting strange she checked the patient's supply of Fioricet and discovered 10 pills were missing, presumably all taken the same day (yesterday)  Regarding her seizure history, daughter reports she is not on any antiseizure medications, and her seizures were in the setting of illicit substance use (remote).  She has never had seizures related to her meningioma  LKW: 6:30 AM  tPA given?: No, VATS procedure and out of the window IA performed?: No, no LVO Premorbid modified rankin scale:      0 - No symptoms.  ROS: Unable to obtain due to altered mental status.   Past Medical History:  Diagnosis Date   Anemia    Anxiety    Aortic atherosclerosis (HCC)    Arthritis    Bipolar disorder ([place])    Chronic bronchitis with COPD (chronic obstructive pulmonary disease) (HCC)    COPD (chronic obstructive pulmonary disease) (HCC)    Depression    Diabetes (HCC)    Type II   Diabetes mellitus, type II (HCC)    Dyspnea    GERD (gastroesophageal reflux  disease)    Head injury    Headache    migraines- 3 times a week   HLD (hyperlipidemia)    HTN (hypertension)    Lesion of lung 2023   suspect lung cancer per pt   LOC (loss of consciousness) ([place])    Meningioma ([place])    L frontal   Pneumonia    PTSD (post-traumatic stress disorder)    Seizure ([place])    hx of   Past Surgical History:  Procedure Laterality Date   BIOPSY  05/23/2022   Procedure: BIOPSY;  Surgeon: Eloise Harman, DO;  [facility];  Service: Endoscopy;;   CARPAL TUNNEL RELEASE Right    CHOLECYSTECTOMY  2013   COLONOSCOPY  2015   In [place].  Per patient, she had colon polyps.   COLONOSCOPY WITH PROPOFOL N/A 05/23/2022   Procedure: COLONOSCOPY WITH PROPOFOL;  Surgeon: Eloise Harman, DO;  [facility];  Service: Endoscopy;  Laterality: N/A;  11:00am   ESOPHAGOGASTRODUODENOSCOPY (EGD) WITH PROPOFOL N/A 05/23/2022   Procedure: ESOPHAGOGASTRODUODENOSCOPY (EGD) WITH PROPOFOL;  Surgeon: Eloise Harman, DO;  [facility];  Service: Endoscopy;  Laterality: N/A;   FOOT SURGERY Left    LUMBAR LAMINECTOMY/ DECOMPRESSION WITH MET-RX Left 04/14/2020   Procedure: Left Lumbar Four-Five Minimally invasive lumbar decompression;  Surgeon: Judith Part, MD;  Location: [facility];  Service: Neurosurgery;  Laterality: Left;  Left Lumbar Four-Five Minimally invasive lumbar decompression   POLYPECTOMY  05/23/2022   Procedure: POLYPECTOMY;  Surgeon: Eloise Harman, DO;  [facility];  Service: Endoscopy;;   thumb surgery Left  pin placed   TUBAL LIGATION     Current Outpatient Medications  Medication Instructions   Aimovig 70 mg, Subcutaneous, Every 30 days   Aimovig 70 mg, Subcutaneous, Every 30 days   albuterol (VENTOLIN HFA) 108 (90 Base) MCG/ACT inhaler 1-2 puffs, Inhalation, Every 6 hours PRN   ascorbic acid (VITAMIN C) 500 mg, Oral, Daily   aspirin 81 mg, Oral, Daily   atorvastatin (LIPITOR) 40 mg, Oral, Daily    butalbital-acetaminophen-caffeine (FIORICET) 50-325-40 MG tablet 1 tablet, Oral, Every 4 hours PRN   clonazePAM (KLONOPIN) 0.5 mg, Oral, 3 times daily   cyanocobalamin (VITAMIN B12) 1,000 mcg, Oral, Daily   doxycycline (VIBRA-TABS) 100 mg, Oral, 2 times daily   DULoxetine (CYMBALTA) 30 mg, Oral, Daily   DULoxetine (CYMBALTA) 60 mg, Oral, Every morning   Glycopyrrolate-Formoterol (BEVESPI AEROSPHERE) 9-4.8 MCG/ACT AERO Take 2 puffs first thing in am and then another 2 puffs about 12 hours later.   hydrochlorothiazide (HYDRODIURIL) 25 mg, Oral, Daily   lithium carbonate 300 mg, Oral, 2 times daily   losartan (COZAAR) 25 mg, Oral, Daily   metFORMIN (GLUCOPHAGE) 500 mg, Oral, Daily with breakfast   Multiple Vitamin (MULTIVITAMIN ADULT PO) 1 tablet, Oral, Daily   naloxone (NARCAN) 4 MG/0.1ML LIQD nasal spray kit 1 spray, Nasal, As needed   Nurtec 75 mg, Oral, As needed, Max dose one pill in 24 hours   Nurtec 75 mg, Oral, As needed   OXYGEN 2 L, Inhalation, Continuous   pantoprazole (PROTONIX) 40 mg, Oral, Daily before breakfast   predniSONE (DELTASONE) 10 MG tablet Take  4 each am x 2 days,   2 each am x 2 days,  1 each am x 2 days and stop   QUEtiapine (SEROQUEL) 400 mg, Oral, Daily at bedtime   QUEtiapine (SEROQUEL) 50 mg, Oral, Every morning   Vitamin D (Cholecalciferol) 1,000 Units, Oral, Daily   Family History  Problem Relation Age of Onset   Migraines Mother    Diabetes Mother    Dementia Mother    Cancer Father    Heart disease Father    Colon cancer Neg Hx    Social History:  reports that she quit smoking 10 days ago. Her smoking use included cigarettes. She has a 20.00 pack-year smoking history. She has never used smokeless tobacco. She reports that she does not currently use drugs. She reports that she does not drink alcohol.  Exam: Current vital signs: BP 125/82 (BP Location: Left Arm)   Pulse (!) 58   Temp 99 F (37.2 C) (Oral)   Resp 19   Ht '5\' 4"'  (1.626 m)   Wt 65.8  kg   SpO2 96%   BMI 24.89 kg/m  Vital signs in last 24 hours: Temp:  [97.1 F (36.2 C)-99 F (37.2 C)] 99 F (37.2 C) (08/21 1955) Pulse Rate:  [51-69] 58 (08/21 1804) Resp:  [6-27] 19 (08/21 1955) BP: (104-163)/(67-100) 125/82 (08/21 1955) SpO2:  [93 %-100 %] 96 % (08/21 1804) Arterial Line BP: (143-172)/(61-76) 155/64 (08/21 1545) Weight:  [65.8 kg] 65.8 kg (08/21 7121)   Physical Exam  Constitutional: Appears well-developed and well-nourished.  Warm to the touch Psych: Giggling, pleasant Eyes: No scleral injection HENT: No oropharyngeal obstruction.  MSK: no joint deformities.  Cardiovascular: Normal rate and regular rhythm. Perfusing extremities well. Respiratory: Effort normal, non-labored breathing.  Chest tube in place GI: Soft.  No distension. There is no tenderness.  Skin: Intact chest tube site  Neuro: Mental Status: Patient is  awake, alert, oriented to first name only, unable to give any history, perseverates with poor attention and therefore cannot participate in naming but able to repeat Cranial Nerves: II: Visual Fields are full to blink to threat, incorrectly reports numbers of fingers. Pupils are equal, round, and reactive to light.   III,IV, VI: EOMI without ptosis or diploplia.  V: Facial sensation is symmetric to light eyelash brush VII: Facial movement is symmetric.  VIII: hearing is intact to voice XII: tongue is midline without atrophy or fasciculations.  Motor: Tone is normal. Bulk is normal.  No pronator drift.  No drift of either leg.  Confusion limits confrontational strength testing but grossly moving all extremities equally Sensory: Equally reactive to tickle and light noxious stimulation in all 4 extremities Deep Tendon Reflexes: 2+ and symmetric in the brachioradialis and patellae.  Plantars: Toes are downgoing bilaterally.  Cerebellar: FNF and HKS are intact bilaterally (with repeated instructions and physical guidance she is able to  perform this) Gait:  Deferred   NIHSS total 4 Score breakdown: 2 points for not answering questions correctly, one-point for not following commands reliably, one-point for aphasia Performed at 20:05 PM   I have reviewed labs in epic and the results pertinent to this consultation are:  Basic Metabolic Panel: Recent Labs  Lab 07/22/22 1100 07/25/22 1003  NA 136 139  K 3.8 4.2  CL 101  --   CO2 25  --   GLUCOSE 103*  --   BUN 5*  --   CREATININE 0.72  --   CALCIUM 10.5*  --     CBC: Recent Labs  Lab 07/22/22 1100 07/25/22 1003  WBC 9.8  --   HGB 14.3 14.3  HCT 43.3 42.0  MCV 100.2*  --   PLT 271  --     Coagulation Studies: No results for input(s): "LABPROT", "INR" in the last 72 hours.    I have reviewed the images obtained: Head CT personally reviewed, agree with radiology there is no acute intracranial process.  Stable small meningioma without mass effect in the left frontal lobe CTA personally reviewed, CTP personally reviewed, agree with radiology no large vessel occlusion, no tissue at risk  Impression: Delirium mixed with toxic/metabolic encephalopathy in the setting of her procedure with anesthesia as well as overuse of Fioricet.  Will assess for additional reversible causes as outlined below.  Expect gradual improvement noting Fioricet has a very long half-life and may take several days to fully wash out of her system  Recommendations:  #Altered mental status -UA, UDS -Depakote level -Lithium level -B12, MMA, thiamine, RPR, HIV, TSH, ammonia -Takes clonazepam 0.5 mg scheduled 3 times daily at home, will give 0.5 mg nightly here to avoid withdrawal seizures.  Home dosing may be resumed when considered stable per primary team -Routine EEG -MRI brain with and without contrast (to exclude new intracranial process including evaluation for potential spread of her malignancy)  #Migraines, with element of medication overuse headache -Discontinue Fioricet  (completed) -Rescue: Nurtec 75 mg PRN was recently prescribed, not on our formulary.  May be ordered nonformulary if patient's family can bring in her home supply (to be ordered by primary team) -She can continue her home Aimovig (once a month injection so not likely to be due soon)  Neurology will follow up MRI and EEG, but otherwise will be available on an as-needed basis.  Remainder of work-up should be followed up by primary team.  Please do not hesitate to reach out  if any questions or concerns arise  Lesleigh Noe MD-PhD [facility] [phone] Available 7 PM to 7 AM, outside of these hours please call Neurologist on call as listed on Amion.   Total critical care time: 60 minutes   Critical care time was exclusive of separately billable procedures and treating other patients.   Critical care was necessary to treat or prevent imminent or life-threatening deterioration; patient was being evaluated for acute change in neurological status with potential for treatment with thrombolytic or thrombectomy   Critical care was time spent personally by me on the following activities: development of treatment plan with patient and/or surrogate as well as nursing, discussions with consultants/primary team, evaluation of patient's response to treatment, examination of patient, obtaining history from patient or surrogate, ordering and performing treatments and interventions, ordering and review of laboratory studies, ordering and review of radiographic studies, and re-evaluation of patient's condition as needed, as documented above.

## 2022-07-25 NOTE — Brief Op Note (Signed)
07/25/2022  1:11 PM  PATIENT:  Vicki Blankenship  66 y.o. female  PRE-OPERATIVE DIAGNOSIS:  Right middle and upper lobe nodules  POST-OPERATIVE DIAGNOSIS:  Non-small cell carcinoma right middle lobe (Clinical stage IA T1,N0) Adenomatous hyperplasia right upper lobe  PROCEDURE:  Procedure(s): XI ROBOTIC ASSISTED THORACOSCOPY-RIGHT MIDDLE AND UPPER LOBE WEDGE RESECTION, RIGHT MIDDLE LOBECTOMY (Right) VIDEO BRONCHOSCOPY WITH ENDOBRONCHIAL NAVIGATION (N/A) INTERCOSTAL NERVE BLOCK (Right) LYMPH NODE DISSECTION (Right)  SURGEON:  Surgeon(s) and Role:    * [name], MD - Primary  PHYSICIAN ASSISTANT:   ASSISTANTS: Nicanor Alcon, MD   ANESTHESIA:   general  EBL:  200 mL   BLOOD ADMINISTERED:none  DRAINS:  48F Chest Tube(s) in the right    LOCAL MEDICATIONS USED:  OTHER 20 ml liposomal bupivacaine, 30 ml 0.5% bupivacaine  SPECIMEN:  Source of Specimen:  RUL wedge, right middle lobe, lymph nodes  DISPOSITION OF SPECIMEN:  PATHOLOGY  COUNTS:  YES  TOURNIQUET:  * No tourniquets in log *  DICTATION: .Other Dictation: Dictation Number -  PLAN OF CARE: Admit to inpatient   PATIENT DISPOSITION:  PACU - hemodynamically stable.   Delay start of Pharmacological VTE agent (>24hrs) due to surgical blood loss or risk of bleeding: no

## 2022-07-26 ENCOUNTER — Inpatient Hospital Stay (HOSPITAL_COMMUNITY): Payer: Medicare HMO

## 2022-07-26 ENCOUNTER — Encounter (HOSPITAL_COMMUNITY): Payer: Self-pay | Admitting: Thoracic Surgery (Cardiothoracic Vascular Surgery)

## 2022-07-26 DIAGNOSIS — Z9889 Other specified postprocedural states: Secondary | ICD-10-CM

## 2022-07-26 DIAGNOSIS — R4182 Altered mental status, unspecified: Secondary | ICD-10-CM

## 2022-07-26 LAB — POCT I-STAT 7, (LYTES, BLD GAS, ICA,H+H)
Acid-Base Excess: 7 mmol/L — ABNORMAL HIGH (ref 0.0–2.0)
Bicarbonate: 33.2 mmol/L — ABNORMAL HIGH (ref 20.0–28.0)
Calcium, Ion: 1.28 mmol/L (ref 1.15–1.40)
HCT: 36 % (ref 36.0–46.0)
Hemoglobin: 12.2 g/dL (ref 12.0–15.0)
O2 Saturation: 97 %
Patient temperature: 98.2
Potassium: 3.8 mmol/L (ref 3.5–5.1)
Sodium: 138 mmol/L (ref 135–145)
TCO2: 35 mmol/L — ABNORMAL HIGH (ref 22–32)
pCO2 arterial: 55.8 mmHg — ABNORMAL HIGH (ref 32–48)
pH, Arterial: 7.382 (ref 7.35–7.45)
pO2, Arterial: 90 mmHg (ref 83–108)

## 2022-07-26 LAB — BASIC METABOLIC PANEL
Anion gap: 8 (ref 5–15)
BUN: 11 mg/dL (ref 8–23)
CO2: 28 mmol/L (ref 22–32)
Calcium: 9.4 mg/dL (ref 8.9–10.3)
Chloride: 103 mmol/L (ref 98–111)
Creatinine, Ser: 0.85 mg/dL (ref 0.44–1.00)
GFR, Estimated: >60 mL/min (ref >60)
Glucose, Bld: 113 mg/dL — ABNORMAL HIGH (ref 70–99)
Potassium: 4.1 mmol/L (ref 3.5–5.1)
Sodium: 139 mmol/L (ref 135–145)

## 2022-07-26 LAB — RPR: RPR Ser Ql: NONREACTIVE

## 2022-07-26 LAB — GLUCOSE, CAPILLARY
Glucose-Capillary: 118 mg/dL — ABNORMAL HIGH (ref 70–99)
Glucose-Capillary: 120 mg/dL — ABNORMAL HIGH (ref 70–99)
Glucose-Capillary: 124 mg/dL — ABNORMAL HIGH (ref 70–99)
Glucose-Capillary: 128 mg/dL — ABNORMAL HIGH (ref 70–99)
Glucose-Capillary: 133 mg/dL — ABNORMAL HIGH (ref 70–99)
Glucose-Capillary: 133 mg/dL — ABNORMAL HIGH (ref 70–99)

## 2022-07-26 LAB — VITAMIN B12: Vitamin B-12: 1279 pg/mL — ABNORMAL HIGH (ref 180–914)

## 2022-07-26 LAB — HIV ANTIBODY (ROUTINE TESTING W REFLEX): HIV Screen 4th Generation wRfx: NONREACTIVE

## 2022-07-26 LAB — TSH: TSH: 0.46 u[IU]/mL (ref 0.350–4.500)

## 2022-07-26 LAB — LITHIUM LEVEL: Lithium Lvl: 0.89 mmol/L (ref 0.60–1.20)

## 2022-07-26 LAB — VALPROIC ACID LEVEL: Valproic Acid Lvl: <10 ug/mL — ABNORMAL LOW (ref 50.0–100.0)

## 2022-07-26 NOTE — Progress Notes (Signed)
   07/25/22 1931  Notify: Rapid Response  Name of Rapid Response RN Notified Mindy RN  Date Rapid Response Notified 07/26/22  Time Rapid Response Notified 1931   Pt not answering questions correctly, does not know her last name, or her children's last names.  She does not know what year it is, why she is in the hospital or that she had surgery.  Called Rapid respond to come and evaluate.

## 2022-07-26 NOTE — Procedures (Addendum)
Patient Name: Vicki Blankenship  MRN: [id number]  Epilepsy Attending: Lora Havens  Referring Physician/Provider: Lorenza Chick, MD  Date: 07/26/2022  Duration: 21.55 mins  Patient history: 66 year old female with altered mental status.  EEG to evaluate for seizure.  Level of alertness: Awake  AEDs during EEG study: None  Technical aspects: This EEG study was done with scalp electrodes positioned according to the 10-20 International system of electrode placement. Electrical activity was reviewed with band pass filter of 1-70Hz , sensitivity of 7 uV/mm, display speed of 71mm/sec with a 60Hz  notched filter applied as appropriate. EEG data were recorded continuously and digitally stored.  Video monitoring was available and reviewed as appropriate.  Description: No clear posterior dominant rhythm was seen. EEG showed continuous generalized polymorphic 3 to 7 Hz theta-delta slowing.  Sharp transients were noted in left temporal region.  Hyperventilation and photic stimulation were not performed.     ABNORMALITY - Continuous slow, generalized  IMPRESSION: This study is suggestive of moderate diffuse encephalopathy, nonspecific etiology. No seizures were seen throughout the recording.  Sharp transients were noted in left temporal region which are most likely artifactual.  However, if concern by ictal-interictal activity persists, recommend long-term EEG monitoring.  Elasia Furnish Barbra Sarks

## 2022-07-26 NOTE — Progress Notes (Signed)
  Transition of Care [facility]) Screening Note   Patient Details  Name: Vicki Blankenship Date of Birth: [birth year]   [facility] of Care [facility]) CM/SW Contact:    Milas Gain, [place] Phone Number: 07/26/2022, 4:46 PM    Transition of Care Department [facility]) has reviewed patient and no TOC needs have been identified at this time. We will continue to monitor patient advancement through interdisciplinary progression rounds. If new patient transition needs arise, please place a TOC consult.

## 2022-07-26 NOTE — Progress Notes (Addendum)
1 Day Post-Op Procedure(s) (LRB): XI ROBOTIC ASSISTED THORACOSCOPY-RIGHT MIDDLE AND UPPER LOBE WEDGE RESECTION, RIGHT MIDDLE LOBECTOMY (Right) VIDEO BRONCHOSCOPY WITH ENDOBRONCHIAL NAVIGATION (N/A) INTERCOSTAL NERVE BLOCK (Right) LYMPH NODE DISSECTION (Right) Subjective: Overnight Vicki Blankenship had some confusion prompting a code stroke -> CT head, CT angio head/neck, MRI brain negative for acute process. Likely toxic/metabolic due to psychiatric meds, Fioricet overdose a day prior. Improving mental status this morning. Complaining of pain. Weak cough. + air leak  Objective: Vital signs in last 24 hours: Temp:  [97.1 F (36.2 C)-100.7 F (38.2 C)] 98.2 F (36.8 C) (08/22 0000) Pulse Rate:  [46-69] 50 (08/22 0800) Cardiac Rhythm: Sinus bradycardia (08/21 2230) Resp:  [6-27] 16 (08/22 0800) BP: (111-163)/(58-100) 127/89 (08/22 0800) SpO2:  [93 %-100 %] 95 % (08/22 0800) Arterial Line BP: (143-172)/(61-76) 155/64 (08/21 1545) Weight:  [65.7 kg] 65.7 kg (08/21 2200)  Hemodynamic parameters for last 24 hours:    Intake/Output from previous day: 08/21 0701 - 08/22 0700 In: 1900 [I.V.:1500; IV Piggyback:400] Out: 1420 [Urine:1100; Blood:200; Chest Tube:120] Intake/Output this shift: No intake/output data recorded.  General appearance: alert, cooperative, and appears stated age Neurologic: A&O x3, no focal deficit Heart: sinus brady, normal S1/S2 Lungs: clear to auscultation bilaterally Abdomen: soft, non-tender; bowel sounds normal; no masses,  no organomegaly Extremities: extremities normal, atraumatic, no cyanosis or edema Wound: incisions c/d/I, chest tube with drainage on dressing. + air leak with cough.  Lab Results: Recent Labs    07/25/22 1003 07/26/22 0315  HGB 14.3 12.2  HCT 42.0 36.0   BMET:  Recent Labs    07/26/22 0106 07/26/22 0315  NA 139 138  K 4.1 3.8  CL 103  --   CO2 28  --   GLUCOSE 113*  --   BUN 11  --   CREATININE 0.85  --   CALCIUM 9.4  --      PT/INR: No results for input(s): "LABPROT", "INR" in the last 72 hours. ABG    Component Value Date/Time   PHART 7.382 07/26/2022 0315   HCO3 33.2 (H) 07/26/2022 0315   TCO2 35 (H) 07/26/2022 0315   O2SAT 97 07/26/2022 0315   CBG (last 3)  Recent Labs    07/25/22 1955 07/26/22 0011 07/26/22 0429  GLUCAP 116* 120* 124*    Assessment/Plan:  Vicki Blankenship is 1 Day Post-Op s/p navigational bronchoscopy for tumor marking, robot-assisted RUL wedge, RML wedge, RML lobectomy, mediastinal lymph node sampling, intercostal nerve blocks level 3-10. - appreciate neurology following for management of AMS, improving - continue chest tube today - incentive spirometry, cough/deep breathe - foley out - daily CXR - ok for OOU   LOS: 1 day    Nicanor Alcon, MD [facility] Resident 07/26/2022  Patient seen and examined, some confusion initial postop period has resolved. She does have an air leak which is not unexpected.  Leave tube in place on waterseal. Transfer to progressive unit  Remo Lipps C. Roxan Hockey, MD [facility] [phone]

## 2022-07-26 NOTE — Progress Notes (Signed)
Results follow up  Signed out by outgoing team to follow up results.  EEG ABNORMALITY - Continuous slow, generalized IMPRESSION: This study is suggestive of moderate diffuse encephalopathy, nonspecific etiology. No seizures were seen throughout the recording. Sharp transients were noted in left temporal region which are most likely artifactual.  However, if concern by ictal-interictal activity persists, recommend long-term EEG monitoring.  MRI brain IMPRESSION: 1. No acute finding. 2. 13 x 6 mm left frontal meningioma, unchanged from 2022.  Lithium therapeutic TSH 0.46 B12 - 1279 VPA undetectable Ammonia ordered but canceled. Will defer to primary team.  With improving mentation, no further inpatient neurological work up needed.  Please call neurology with questions as needed.  -- Amie Portland, MD Neurologist [facility] Pager: [phone]

## 2022-07-26 NOTE — Progress Notes (Signed)
Patient ID: Vicki Blankenship, female   DOB: 06-Dec-1955, 66 y.o.   MRN: [id number] TCTS Evening Rounds:  Hemodynamically stable  Mental status changes resolved.  Ambulated 300 ft today.  Sitting up in chair talking with family.  Pain controlled.

## 2022-07-26 NOTE — Hospital Course (Addendum)
History of Present Illness:  Vicki Blankenship is a 66 year old woman with a history of tobacco abuse, COPD, type 2 diabetes, hypertension, hyperlipidemia, bipolar disorder, meningioma, head injury, PTSD, seizure history, arthritis, osteopenia, reflux, and depression.  She moved to [place] in 2021.  She started in the low-dose CT for lung cancer screening program in 2022.  She had a CT which showed small nodules measuring up to 6 mm in the right lower lobe.  She had a follow-up scan in June which showed a new or enlarged 12 mm right middle lobe nodule.  A peripheral right upper lobe nodule increased in size from 4 to 6 mm.  There was a 1 cm lower right paratracheal node.  She had a PET/CT which showed the middle lobe nodule was hypermetabolic with an SUV of 4.8.  The right upper lobe nodule is too small to characterize.  The right paratracheal node was at background levels.  She smoked on average about a pack a day for almost 50 years.  She had cut down to about 1/2 pack/day and currently is smoking about 4 cigarettes/day.  She uses oxygen at night and also when she takes a nap during the day.  She recently started using oxygen for exertion.  She complains of a loss of appetite with a 20 pound weight loss over the past 3 months.  She attributes that to anxiety.  She does suffer from headaches and dizziness.  Has difficulty walking due to pain in her hips.  She complains of right sided chest pain that starts along the costal margin and radiates up through the chest.  It usually lasts less than a minute and can come on at rest or exertion.  She is disabled from work.  She was evaluated by Dr. Roxan Hockey who felt she would benefit from Lung resection.  The risks and benefits of the procedure were explained to the patient and she was agreeable to proceed.  Hospital Course:   [name] presented to [facility] on 07/25/2022.  The patient was taken to the operating room and underwent Endobronchial  Navigation,  Right Robotic Assisted Right Middle and Upper Lobe Wedge Resection, Right Middle Lobectomy, Intercostal Nerve Block, and lymph node dissection.  The patient tolerated the procedure without difficulty, was extubated, and taken to the PACU in stable condition.    Of note the patient has a history of migraine and takes multiple anti-migraine medications.  Rapid response was initiated they evaluated patient and initiated Code Stroke.  Neurology work up was negative, EEG was obtained and showed moderate diffuse encephalopathy.  Her CXR was free from pneumothorax.  Her chest tube had an air leak and was left on water seal.  Her mental status improved and Neurology signed off without further recommendations.  Her CXR remained stable.  Her air leak resolved and her chest tube was removed on 07/28/2022.  Follow up CXR showed stable appearance of apical pneumothorax.  She is ambulating without difficulty.  Her surgical incisions are healing without evidence of infection.  She is stable for discharge home today.

## 2022-07-26 NOTE — Progress Notes (Signed)
EEG complete - results pending 

## 2022-07-26 NOTE — Discharge Summary (Addendum)
Physician Discharge Summary  Patient ID: Vicki Blankenship MRN: [id number] DOB/AGE: [birth year] 66 y.o.  Admit date: 07/25/2022 Discharge date: 07/29/2022  Admission Diagnoses: Lung nodules in right upper and middle lobes  Patient Active Problem List   Diagnosis Date Noted   Non-small cell carcinoma of lung, right ([place]) 07/25/2022   Multiple pulmonary nodules determined by computed tomography of lung 05/23/2022   Diabetes mellitus, stable ([place]) 04/22/2022   COPD GOLD 2 / still smoking  04/18/2022   Chronic respiratory failure with hypoxia and hypercapnia ([place]) 04/18/2022   Hypercalcemia 04/08/2022   Prediabetes 04/08/2022   Osteopenia of both hips 04/08/2022   Vitamin D deficiency 04/08/2022   Current smoker 04/08/2022   Lumbar radiculopathy 04/14/2020   GERD (gastroesophageal reflux disease) 10/30/2019   Abdominal pain, epigastric 10/30/2019   Alternating constipation and diarrhea 10/30/2019   History of colonic polyps 10/30/2019    Discharge Diagnoses:  Squamous cell carcinoma right middle lobe, clinical and pathologic stage Ia (T1, N0).   Benign nodule right upper lobe.  Patient Active Problem List   Diagnosis Date Noted   S/P Right Robotic Assisted Right Upper Lobe Wedge Resection and Right Middle Lobectomy 07/26/2022   Non-small cell carcinoma of lung, right ([place]) 07/25/2022   Multiple pulmonary nodules determined by computed tomography of lung 05/23/2022   Diabetes mellitus, stable ([place]) 04/22/2022   COPD GOLD 2 / still smoking  04/18/2022   Chronic respiratory failure with hypoxia and hypercapnia ([place]) 04/18/2022   Hypercalcemia 04/08/2022   Prediabetes 04/08/2022   Osteopenia of both hips 04/08/2022   Vitamin D deficiency 04/08/2022   Current smoker 04/08/2022   Lumbar radiculopathy 04/14/2020   GERD (gastroesophageal reflux disease) 10/30/2019   Abdominal pain, epigastric 10/30/2019   Alternating constipation and diarrhea 10/30/2019   History of colonic polyps  10/30/2019   Discharged Condition: good  History of Present Illness:  Vicki Blankenship is a 66 year old woman with a history of tobacco abuse, COPD, type 2 diabetes, hypertension, hyperlipidemia, bipolar disorder, meningioma, head injury, PTSD, seizure history, arthritis, osteopenia, reflux, and depression.  She moved to [place] in 2021.  She started in the low-dose CT for lung cancer screening program in 2022.  She had a CT which showed small nodules measuring up to 6 mm in the right lower lobe.  She had a follow-up scan in June which showed a new or enlarged 12 mm right middle lobe nodule.  A peripheral right upper lobe nodule increased in size from 4 to 6 mm.  There was a 1 cm lower right paratracheal node.  She had a PET/CT which showed the middle lobe nodule was hypermetabolic with an SUV of 4.8.  The right upper lobe nodule is too small to characterize.  The right paratracheal node was at background levels.  She smoked on average about a pack a day for almost 50 years.  She had cut down to about 1/2 pack/day and currently is smoking about 4 cigarettes/day.  She uses oxygen at night and also when she takes a nap during the day.  She recently started using oxygen for exertion.  She complains of a loss of appetite with a 20 pound weight loss over the past 3 months.  She attributes that to anxiety.  She does suffer from headaches and dizziness.  Has difficulty walking due to pain in her hips.  She complains of right sided chest pain that starts along the costal margin and radiates up through the chest.  It usually lasts less  than a minute and can come on at rest or exertion.  She is disabled from work.  She was evaluated by Dr. Roxan Hockey who felt she would benefit from Lung resection.  The risks and benefits of the procedure were explained to the patient and she was agreeable to proceed.  Hospital Course:   Vicki Blankenship presented to [facility] on 07[facility]25/2022.  The patient was taken to the  operating room and underwent Endobronchial Navigation,  Right Robotic Assisted Right Middle and Upper Lobe Wedge Resection, Right Middle Lobectomy, Intercostal Nerve Block, and lymph node dissection.  The patient tolerated the procedure without difficulty, was extubated, and taken to the PACU in stable condition.    Of note the patient has a history of migraine and takes multiple anti-migraine medications.  Rapid response was initiated they evaluated patient and initiated Code Stroke.  Neurology work up was negative, EEG was obtained and showed moderate diffuse encephalopathy.  Her CXR was free from pneumothorax.  Her chest tube had an air leak and was left on water seal.  Her mental status improved and Neurology signed off without further recommendations.  Her CXR remained stable.  Her air leak resolved and her chest tube was removed on 07/28/2022.  Follow up CXR showed stable appearance of apical pneumothorax.  She is ambulating without difficulty.  Her surgical incisions are healing without evidence of infection.  She is stable for discharge home today.    Consults: None  Significant Diagnostic Studies: nuclear medicine:   FINDINGS: Mediastinal blood pool activity: SUV max 2.4   Liver activity: SUV max NA   NECK: Physiologic activity in the glottis, palatine tonsils, and anterior tongue.   Incidental CT findings: Bilateral common carotid atherosclerotic calcification.   CHEST: The medial right middle lobe nodule measuring approximately 1.3 by 0.7 cm on image 71 of series 7 has a maximum SUV of 4.8, high suspicion for malignancy.   The 6 by 4 mm right upper lobe subpleural nodule shown on image 29 series 7 does has hazy surrounding interstitial accentuation as on recent CT, and does not demonstrate differential accentuated activity compared to the faint activity associated with the presumed inflammatory peripheral interstitial accentuation. This lesion is below sensitive PET-CT size  thresholds.   A 1.0 cm in short axis lower paratracheal node on image 36 series 7 has a maximum SUV of 2.4, equal to the blood pool.   Incidental CT findings: Centrilobular emphysema. Airway thickening is present, suggesting bronchitis or reactive airways disease. Coronary, aortic arch, and branch vessel atherosclerotic vascular disease.   ABDOMEN/PELVIS: No significant abnormal hypermetabolic activity in this region.   Incidental CT findings: Cholecystectomy. Atherosclerosis is present, including aortoiliac atherosclerotic disease. Nonobstructive bilateral nephrolithiasis. Photopenic left kidney upper pole lesion favoring cyst.   SKELETON: No significant abnormal hypermetabolic activity in this region.   Incidental CT findings: none   IMPRESSION: 1. Hypermetabolic medial right middle lobe nodule, high suspicion for malignancy. 2. The 6 by 4 mm right upper lobe nodule is not discernibly hypermetabolic compared to surrounding tissues, but is below sensitive PET-CT size thresholds. 3. Borderline enlarged right lower paratracheal lymph node has a maximum SUV of 2.4, equal to the blood pool. This tends to favor benign process. 4. Other imaging findings of potential clinical significance: Aortic Atherosclerosis (ICD10-I70.0) and Emphysema (ICD10-J43.9). Coronary atherosclerosis. Airway thickening is present, suggesting bronchitis or reactive airways disease. Nonobstructive bilateral nephrolithiasis.     Electronically Signed   By: Van Clines M.D.   On: 06/03/2022  10:15  Treatments: surgery:   DATE OF PROCEDURE: 07/25/2022   PREOPERATIVE DIAGNOSIS:  Right middle and upper lobe nodules.   POSTOPERATIVE DIAGNOSIS:  Non-small cell carcinoma, right middle lobe, clinical stage IA (T1, N0), and adenomatous hyperplasia, right upper lobe.   PROCEDURES PERFORMED:  Electromagnetic navigational bronchoscopy for tumor marking, Xi robotic-assisted right upper lobe wedge  resection and right middle lobe wedge resection, right middle lobectomy, lymph node dissection and intercostal nerve blocks  levels 3 through 10.   SURGEON:  Revonda Standard. Roxan Hockey, MD   ASSISTANT:  Nicanor Alcon, M.D.  PATHOLOGY:  SURGICAL PATHOLOGY  CASE: [phone]  PATIENT: [place]  Surgical Pathology Report      Clinical History: right middle and upper lobe nodules (cm)      FINAL MICROSCOPIC DIAGNOSIS:   A. LUNG, RIGHT MIDDLE LOBE, WEDGE RESECTION:  - Squamous cell carcinoma, 1.5 cm (pT1a).  - Margins negative for carcinoma.  - Visceral pleural negative for involvement.  - See oncology table.   B. LUNG, RIGHT UPPER LOBE, WEDGE RESECTION:  - Extensive fibrosis associated with atypical pneumocyte proliferation.  - See comment.   C. LYMPH NODE, LEVEL 9, EXCISION:  - Lymph node negative for metastatic carcinoma (0/1).   D. LYMPH NODE, LEVEL 7, EXCISION:  - Lymph node negative for metastatic carcinoma (0/1).   E. LYMPH NODE, LEVEL 7 #2, EXCISION:  - Lymph node negative for metastatic carcinoma (0/1).   F. LYMPH NODE, LEVEL 10, EXCISION:  - Lymph node negative for metastatic carcinoma (0/1).   G. LYMPH NODE, LEVEL 4R, EXCISION:  - Lymph node negative for metastatic carcinoma (0/1).   H. LYMPH NODE, LEVEL 4R #2, EXCISION:  - Lymph node negative for metastatic carcinoma (0/1).   I. LYMPH NODE, LEVEL 12, EXCISION:  - Lymph node negative for metastatic carcinoma (0/1).   J. LYMPH NODE, LEVEL 11, EXCISION:  - Lymph node negative for metastatic carcinoma (0/1).   K. LUNG, RIGHT MIDDLE LOBE, LOBECTOMY:  - Findings consistent with previous wedge biopsy.  - No residual carcinoma present.  - All surgical margins negative for tumor.   Discharge Exam: Blood pressure (!) 148/109, pulse 75, temperature 98.6 F (37 C), temperature source Oral, resp. rate 20, height _0  (1.626 m), weight 65.7 kg, SpO2 96 %.  General appearance: alert, cooperative, and no  distress Heart: regular rate and rhythm Lungs: clear to auscultation bilaterally Abdomen: soft, non-tender; bowel sounds normal; no masses,  no organomegaly Extremities: extremities normal, atraumatic, no cyanosis or edema Wound: clean and dry  Discharge disposition: 01-Home or Self Care        Allergies as of 07/29/2022       Reactions   Penicillins Swelling   Tongue swelling   Wellbutrin [bupropion]    shakes        Medication List     STOP taking these medications    predniSONE 10 MG tablet Commonly known as: DELTASONE       TAKE these medications    Aimovig 70 MG/ML Soaj Generic drug: Erenumab-aooe Inject 70 mg into the skin every 30 (thirty) days.   Aimovig 70 MG/ML Soaj Generic drug: Erenumab-aooe Inject 70 mg into the skin every 30 (thirty) days.   albuterol 108 (90 Base) MCG/ACT inhaler Commonly known as: VENTOLIN HFA Inhale 1-2 puffs into the lungs every 6 (six) hours as needed for wheezing or shortness of breath.   ascorbic acid 500 MG tablet Commonly known as: VITAMIN C Take 500 mg by mouth  daily.   aspirin 81 MG chewable tablet Chew 81 mg by mouth daily.   atorvastatin 40 MG tablet Commonly known as: LIPITOR Take 40 mg by mouth daily.   Bevespi Aerosphere 9-4.8 MCG/ACT Aero Generic drug: Glycopyrrolate-Formoterol Take 2 puffs first thing in am and then another 2 puffs about 12 hours later.   butalbital-acetaminophen-caffeine 50-325-40 MG tablet Commonly known as: FIORICET Take 1 tablet by mouth every 4 (four) hours as needed for migraine.   clonazePAM 0.5 MG tablet Commonly known as: KLONOPIN Take 0.5 mg by mouth 3 (three) times daily.   cyanocobalamin 1000 MCG tablet Commonly known as: VITAMIN B12 Take 1,000 mcg by mouth daily.   doxycycline 100 MG tablet Commonly known as: VIBRA-TABS Take 1 tablet (100 mg total) by mouth 2 (two) times daily.   DULoxetine 60 MG capsule Commonly known as: CYMBALTA Take 60 mg by mouth in  the morning.   DULoxetine 30 MG capsule Commonly known as: CYMBALTA Take 30 mg by mouth daily at 12 noon.   hydrochlorothiazide 25 MG tablet Commonly known as: HYDRODIURIL Take 25 mg by mouth daily.   lithium carbonate 300 MG capsule Take 300 mg by mouth in the morning and at bedtime.   losartan 25 MG tablet Commonly known as: COZAAR Take 25 mg by mouth daily.   metFORMIN 500 MG tablet Commonly known as: GLUCOPHAGE Take 500 mg by mouth daily with breakfast.   MULTIVITAMIN ADULT PO Take 1 tablet by mouth daily.   Narcan 4 MG/0.1ML Liqd nasal spray kit Generic drug: naloxone Place 1 spray into the nose as needed (opioid overdose).   Nurtec 75 MG Tbdp Generic drug: Rimegepant Sulfate Take 75 mg by mouth as needed. Max dose one pill in 24 hours   Nurtec 75 MG Tbdp Generic drug: Rimegepant Sulfate Take 75 mg by mouth as needed.   oxyCODONE 5 MG immediate release tablet Commonly known as: Oxy IR/ROXICODONE Take 1 tablet (5 mg total) by mouth every 4 (four) hours as needed for moderate pain.   OXYGEN Inhale 2 L into the lungs continuous.   pantoprazole 40 MG tablet Commonly known as: PROTONIX Take 1 tablet (40 mg total) by mouth daily before breakfast.   QUEtiapine 400 MG tablet Commonly known as: SEROQUEL Take 400 mg by mouth at bedtime.   QUEtiapine 50 MG tablet Commonly known as: SEROQUEL Take 50 mg by mouth every morning.   Vitamin D (Cholecalciferol) 25 MCG (1000 UT) Tabs Take 1,000 Units by mouth daily.        Follow-up Information     [name], MD Follow up on 08/09/2022.   Specialty: Cardiothoracic Surgery Why: Appointment is at Contact information: [address] [place] [address] [phone]                 Signed: Ellwood Handler, PA-C  07/29/2022, 8:23 AM

## 2022-07-26 NOTE — Op Note (Signed)
NAME: Vicki Blankenship, Vicki Blankenship MEDICAL RECORD NO: [id number] ACCOUNT NO: [id number] DATE OF BIRTH: 10-Apr-1956 FACILITY: [facility] LOCATION: [facility] PHYSICIAN: Revonda Standard. Roxan Hockey, MD  Operative Report   DATE OF PROCEDURE: 07/25/2022  PREOPERATIVE DIAGNOSIS:  Right middle and upper lobe nodules.  POSTOPERATIVE DIAGNOSIS:  Non-small cell carcinoma, right middle lobe, clinical stage IA (T1, N0), and adenomatous hyperplasia, right upper lobe.  PROCEDURES PERFORMED:   Electromagnetic navigational bronchoscopy for tumor marking,  Xi robotic-assisted right upper lobe wedge resection and right middle lobe wedge resection, right middle lobectomy,  Lymph node dissection and  Intercostal nerve blocks levels 3 through 10.  SURGEON:  Revonda Standard. Roxan Hockey, MD  ASSISTANT:  Nicanor Alcon, M.D.  ANESTHESIA:  General.  FINDINGS:  Right middle lobe nodule showed adenocarcinoma and the right upper lobe nodule showed adenomatous hyperplasia.  Margin free of disease.  CLINICAL NOTE:  Vicki Blankenship was brought to the preoperative holding area on 07/25/2022.  Anesthesia placed an arterial blood pressure monitoring line and established intravenous access.  Planning for the navigational bronchoscopy was performed on the  console.  The patient was taken to the operating room and anesthetized and intubated.  Foley catheter was placed.  Sequential compression devices were placed on the calves for DVT prophylaxis.  A Bair Hugger was placed for active warming.  Intravenous  antibiotics were administered.  A timeout was performed.  Flexible fiberoptic bronchoscopy was performed via the endotracheal tube.  There were thick clear secretions bilaterally.  Bronchoscopy was performed via the endotracheal tube.  It revealed normal endobronchial anatomy.  There  were some thin clear secretions bilaterally.  There were no endobronchial lesions seen.  The first target was the right upper lobe bronchus and the appropriate subsegmental  bronchus was cannulated.  The catheter was advanced to within 9 mm of the lesion  and marking then was performed injecting 0.5 mL of a 50:50 solution containing methylene blue and ICG.  The second target was the right middle lobe nodule and the locatable guide for navigation was easily advanced within a centimeter of the nodule with  good alignment and this was marked as well.  OPERATIVE NOTE:  Vicki Blankenship was reintubated with a double lumen endotracheal tube.  She was placed in a left lateral decubitus position.  A Bair Hugger was placed for active warming.  The chest was prepped and draped in the usual sterile fashion.   Single lung ventilation of the left lung was initiated and was tolerated well throughout the procedure.  A timeout was performed.  A solution containing 20 mL of liposomal bupivacaine, 30 mL of 0.5% bupivacaine and 50 mL of saline was prepared.  This solution was used for local at the incision sites as well as for the intercostal nerve blocks.  An incision was made  in the eighth interspace in approximately the midaxillary line, and an 8 mm port was inserted into the chest.  The thoracoscope was advanced into the chest. After confirming intrapleural placement, carbon dioxide was insufflated per protocol.  A 12 mm port  was placed in the eighth interspace anterior to the camera port.  Intercostal nerve blocks then were performed from the third to the tenth interspace by injecting 10 mL of the bupivacaine solution into a subpleural plane at each level. A 12 mm AirSeal  port was placed in the tenth interspace posteriorly and then 2 additional robotic ports were placed in the eighth interspace.  The robot was deployed.  The camera arm was docked.  The  thoracoscope was inserted into the chest.   Targeting was performed.  The remaining arms were docked.  Robotic instruments were inserted with thoracoscopic visualization.  The dissection was begun with division of the inferior ligament. Level  9 node was removed.  All lymph nodes removed were sent as separate specimens for permanent pathology.  All appeared grossly benign.  The pleural reflection then was divided at the  hilum posteriorly up to the level of the azygos vein.  Level 7 nodes were removed as well as an level 11 node.  The lung by this point had deflated enough to allow performance of the wedge resections.  A wedge resection was performed of the right upper  lobe nodule with sequential firings of the robotic stapler.  The marking site was clearly visible with both methylene blue and the ICG using the Firefly setting on the robotic console.  The middle lobe wedge then was performed.  There was some staining  with methylene blue and ICG in the pleura, but again, the area of the nodule was clearly visible in the medial right middle lobe.  A wedge resection was performed of this as well.  Specimens then were sent for frozen section.  While awaiting those  results, the lymph node dissection continued, The pleural reflection was divided at the hilum superiorly and level 10 node was removed and then level 4R nodes were removed from the right paratracheal space after dividing the pleura superior to the azygos  vein.  The pleural reflection was divided at the hilum anteriorly using bipolar cautery.  The phrenic nerve was clearly identified.  No cautery was used in its vicinity.  Frozen section returned showing the middle lobe nodule was a non-small cell carcinoma.  The  upper lobe nodule showed adenomatous hyperplasia and the margins were clear. The decision was made to proceed with a right middle lobectomy as discussed with the patient preoperatively.  The major fissure between the lower and middle lobes was completed  with the robotic stapler.  The middle lobe vein was encircled and divided with the robotic stapler.  The lateral middle lobe pulmonary artery branch was exposed and a lymph node adjacent to it was removed.  The arterial branch  was encircled and divided with the robotic  stapler.  Finally, the medial segmental artery was divided.  Stapler was placed across the right middle lobe bronchus at its origin and closed.  A test inflation showed good aeration of the upper and lower lobes.  Stapler was fired transecting the  middle lobe bronchus.  The right middle lobe specimen was placed into an endoscopic retrieval bag and removed through the [place] port.  The chest was copiously irrigated with saline.  All sponges and the vessel loop that had been used were removed.  The  chest was copiously irrigated with saline.  A test inflation to 30 cm of water pressure revealed no significant air leakage.  A 28-French Blake drain was placed through the anterior port incision and secured with #1 silk suture.  Dual lung ventilation was resumed.   The incisions were closed in standard fashion.  Dermabond was applied.  Chest tube was placed to a Pleur-Evac on waterseal.  The patient was placed back in a supine position.  She was extubated in the operating room and taken to the postanesthetic care  unit in good condition.  All sponge, needle and instrument counts were correct at the end of the procedure.  Experienced assistance was necessary for this case  due to surgical complexity.  Dr. Nicanor Alcon served as the assistant providing assistance with port placement, instrument exchange, specimen retrieval, suctioning, and wound closure.   MUK D: 07/25/2022 7:23:04 pm T: 07/26/2022 12:54:00 am  JOB: [id number]/ [id number]

## 2022-07-26 NOTE — Progress Notes (Addendum)
Paged Gold, PA and he called back. RN made PA aware that patient has subcutaneous emphysema palpated to right neck and right trapezius area. PA acknowledged and came to bedside to see patient. RN also asked PA for chest tube orders. PA gave order for chest tube to water seal and to place Vaseline gauze around/at chest tube insertion site and then cover with split guaze.

## 2022-07-26 NOTE — Progress Notes (Signed)
   07/25/22 2118  Assess: MEWS Score  Temp (!) 100.7 F (38.2 C)  BP 136/80  MAP (mmHg) 95  Pulse Rate 61  ECG Heart Rate 61  Resp (!) 23  SpO2 95 %  O2 Device Nasal Cannula  Patient Activity (if Appropriate) In bed  O2 Flow Rate (L/min) 3 L/min  Assess: MEWS Score  MEWS Temp 1  MEWS Systolic 0  MEWS Pulse 0  MEWS RR 1  MEWS LOC 0  MEWS Score 2  MEWS Score Color Yellow  Notify: Provider  Provider Name/Title Dr. Prescott Gum,  Date Provider Notified 07/26/22  Time Provider Notified 2110  Method of Notification Page  Notification Reason Change in status  Provider response See new orders  Date of Provider Response 07/26/22  Time of Provider Response 2111  Document  Patient Outcome Transferred/level of care increased (To be transferred to [place])  Assess: SIRS CRITERIA  SIRS Temperature  0  SIRS Pulse 0  SIRS Respirations  1  SIRS WBC 1  SIRS Score Sum  2

## 2022-07-27 ENCOUNTER — Inpatient Hospital Stay (HOSPITAL_COMMUNITY): Payer: Medicare HMO

## 2022-07-27 LAB — COMPREHENSIVE METABOLIC PANEL
ALT: 59 U/L — ABNORMAL HIGH (ref 0–44)
AST: 68 U/L — ABNORMAL HIGH (ref 15–41)
Albumin: 3.1 g/dL — ABNORMAL LOW (ref 3.5–5.0)
Alkaline Phosphatase: 82 U/L (ref 38–126)
Anion gap: 7 (ref 5–15)
BUN: 9 mg/dL (ref 8–23)
CO2: 26 mmol/L (ref 22–32)
Calcium: 9.7 mg/dL (ref 8.9–10.3)
Chloride: 104 mmol/L (ref 98–111)
Creatinine, Ser: 0.67 mg/dL (ref 0.44–1.00)
GFR, Estimated: >60 mL/min (ref >60)
Glucose, Bld: 104 mg/dL — ABNORMAL HIGH (ref 70–99)
Potassium: 4.2 mmol/L (ref 3.5–5.1)
Sodium: 137 mmol/L (ref 135–145)
Total Bilirubin: 0.7 mg/dL (ref 0.3–1.2)
Total Protein: 6.1 g/dL — ABNORMAL LOW (ref 6.5–8.1)

## 2022-07-27 LAB — CBC
HCT: 37.5 % (ref 36.0–46.0)
Hemoglobin: 12.4 g/dL (ref 12.0–15.0)
MCH: 33.4 pg (ref 26.0–34.0)
MCHC: 33.1 g/dL (ref 30.0–36.0)
MCV: 101.1 fL — ABNORMAL HIGH (ref 80.0–100.0)
Platelets: 210 10*3/uL (ref 150–400)
RBC: 3.71 MIL/uL — ABNORMAL LOW (ref 3.87–5.11)
RDW: 12.9 % (ref 11.5–15.5)
WBC: 10.8 10*3/uL — ABNORMAL HIGH (ref 4.0–10.5)
nRBC: 0 % (ref 0.0–0.2)

## 2022-07-27 LAB — GLUCOSE, CAPILLARY
Glucose-Capillary: 133 mg/dL — ABNORMAL HIGH (ref 70–99)
Glucose-Capillary: 97 mg/dL (ref 70–99)
Glucose-Capillary: 85 mg/dL (ref 70–99)
Glucose-Capillary: 113 mg/dL — ABNORMAL HIGH (ref 70–99)

## 2022-07-27 MED ORDER — METFORMIN HCL 500 MG PO TABS
500.0000 mg | ORAL_TABLET | Freq: Every day | ORAL | Status: DC
  Administered 2022-07-28 – 2022-07-29 (×2): 500 mg via ORAL
  Filled 2022-07-27 (×2): qty 1

## 2022-07-27 NOTE — Progress Notes (Addendum)
Mobility Specialist: Progress Note   07/27/22 1619  Mobility  Activity Ambulated with assistance in hallway  Level of Assistance Standby assist, set-up cues, supervision of patient - no hands on  Assistive Device Four wheel walker (EVA)  Distance Ambulated (ft) 470 ft  Activity Response Tolerated well  $Mobility charge 1 Mobility   Pre-Mobility: 63 HR, 155/80 (101) BP, 98% SpO2 Post-Mobility: 60 HR, 100% SpO2  Received pt in chair and agreeable to mobility. Ambulated on 2 L/min [place]. C/o 6/10 pain at chest tube site, otherwise asymptomatic. Pt back to the chair after session with call bell and phone in reach.   [facility] [place]reille Dardis Mobility Specialist Mobility Specialist [address]: [phone]

## 2022-07-27 NOTE — Progress Notes (Addendum)
2 Days Post-Op Procedure(s) (LRB): XI ROBOTIC ASSISTED THORACOSCOPY-RIGHT MIDDLE AND UPPER LOBE WEDGE RESECTION, RIGHT MIDDLE LOBECTOMY (Right) VIDEO BRONCHOSCOPY WITH ENDOBRONCHIAL NAVIGATION (N/A) INTERCOSTAL NERVE BLOCK (Right) LYMPH NODE DISSECTION (Right) Subjective: Feeling better this morning, pain improving. Ambulating multiple laps. Tolerating diet. Foley removed. Mental status improved to baseline, neurology signed off. + air leak  Objective: Vital signs in last 24 hours: Temp:  [97.6 F (36.4 C)-98.7 F (37.1 C)] 97.6 F (36.4 C) (08/23 0800) Pulse Rate:  [45-75] 58 (08/23 0800) Cardiac Rhythm: Sinus bradycardia;Normal sinus rhythm (08/23 0800) Resp:  [15-23] 22 (08/23 0800) BP: (127-165)/(60-124) 129/108 (08/23 0800) SpO2:  [95 %-100 %] 97 % (08/23 0800)  Hemodynamic parameters for last 24 hours:    Intake/Output from previous day: 08/22 0701 - 08/23 0700 In: 480 [P.O.:480] Out: 1705 [Urine:1485; Chest Tube:220] Intake/Output this shift: Total I/O In: 240 [P.O.:240] Out: 180 [Urine:150; Chest Tube:30]  General appearance: alert, cooperative, and appears stated age Neurologic: intact Heart: regular rate and rhythm and S1, S2 normal Lungs: clear to auscultation bilaterally and   Abdomen: soft, non-tender; bowel sounds normal; no masses,  no organomegaly Extremities: extremities normal, atraumatic, no cyanosis or edema Wound: with skin glue c/d/I. CT with serosang drainage, + air leak with cough  Lab Results: Recent Labs    07/26/22 0315 07/27/22 0629  WBC  --  10.8*  HGB 12.2 12.4  HCT 36.0 37.5  PLT  --  210   BMET:  Recent Labs    07/26/22 0106 07/26/22 0315 07/27/22 0629  NA 139 138 137  K 4.1 3.8 4.2  CL 103  --  104  CO2 28  --  26  GLUCOSE 113*  --  104*  BUN 11  --  9  CREATININE 0.85  --  0.67  CALCIUM 9.4  --  9.7    PT/INR: No results for input(s): "LABPROT", "INR" in the last 72 hours. ABG    Component Value Date/Time    PHART 7.382 07/26/2022 0315   HCO3 33.2 (H) 07/26/2022 0315   TCO2 35 (H) 07/26/2022 0315   O2SAT 97 07/26/2022 0315   CBG (last 3)  Recent Labs    07/26/22 1606 07/26/22 2103 07/27/22 0803  GLUCAP 133* 128* 133*   CXR this morning IMPRESSION: Interval increase in volume RIGHT apical pneumothorax with chest tube in place.   Small amount gas along the medial RIGHT diaphragm presumably small sub pulmonic pneumothorax. Less likely small volume intraperitoneal free air related to thoracic surgery.  Assessment/Plan: Ms. Womac is 2 Day Post-Op s/p navigational bronchoscopy for tumor marking, robot-assisted RUL wedge, RML wedge, RML lobectomy, mediastinal lymph node sampling, intercostal nerve blocks level 3-10, c/b air leak, AMS. - AMS resolved - continue chest tube on WS for air leak - incentive spirometry - daily CXR - OOU   LOS: 2 days    Nicanor Alcon, MD [facility] Resident 07/27/2022  Patient seen and examined, agree with above CXR shows a pneumo/ space essentially unchanged when accounting for angle of film.  + air leak with cough Ambulate, SCD + enoxaparin  Remo Lipps C. Roxan Hockey, MD [facility] [phone]

## 2022-07-28 ENCOUNTER — Inpatient Hospital Stay (HOSPITAL_COMMUNITY): Payer: Medicare HMO

## 2022-07-28 LAB — TYPE AND SCREEN
ABO/RH(D): O POS
Antibody Screen: NEGATIVE
Unit division: 0
Unit division: 0

## 2022-07-28 LAB — BPAM RBC
Blood Product Expiration Date: 202309262359
Blood Product Expiration Date: 202309262359
ISSUE DATE / TIME: 202308210909
ISSUE DATE / TIME: 202308210909
Unit Type and Rh: 5100
Unit Type and Rh: 5100

## 2022-07-28 LAB — GLUCOSE, CAPILLARY
Glucose-Capillary: 114 mg/dL — ABNORMAL HIGH (ref 70–99)
Glucose-Capillary: 107 mg/dL — ABNORMAL HIGH (ref 70–99)
Glucose-Capillary: 85 mg/dL (ref 70–99)
Glucose-Capillary: 113 mg/dL — ABNORMAL HIGH (ref 70–99)

## 2022-07-28 LAB — METHYLMALONIC ACID, SERUM: Methylmalonic Acid, Quantitative: 222 nmol/L (ref 0–378)

## 2022-07-28 LAB — VITAMIN B1: Vitamin B1 (Thiamine): 192.9 nmol/L (ref 66.5–200.0)

## 2022-07-28 LAB — SURGICAL PATHOLOGY

## 2022-07-28 MED ORDER — HYDROCHLOROTHIAZIDE 25 MG PO TABS
25.0000 mg | ORAL_TABLET | Freq: Every day | ORAL | Status: DC
  Administered 2022-07-28 – 2022-07-29 (×2): 25 mg via ORAL
  Filled 2022-07-28 (×2): qty 1

## 2022-07-28 MED ORDER — LOSARTAN POTASSIUM 25 MG PO TABS
25.0000 mg | ORAL_TABLET | Freq: Every day | ORAL | Status: DC
  Administered 2022-07-28 – 2022-07-29 (×2): 25 mg via ORAL
  Filled 2022-07-28 (×2): qty 1

## 2022-07-28 NOTE — Progress Notes (Signed)
      [address]       [place] [address]             [phone]      3 Days Post-Op Procedure(s) (LRB): XI ROBOTIC ASSISTED THORACOSCOPY-RIGHT MIDDLE AND UPPER LOBE WEDGE RESECTION, RIGHT MIDDLE LOBECTOMY (Right) VIDEO BRONCHOSCOPY WITH ENDOBRONCHIAL NAVIGATION (N/A) INTERCOSTAL NERVE BLOCK (Right) LYMPH NODE DISSECTION (Right)  Subjective:  No new complaints.  Confusion is resolved  Objective: Vital signs in last 24 hours: Temp:  [97.6 F (36.4 C)-98.6 F (37 C)] 98.6 F (37 C) (08/24 0753) Pulse Rate:  [54-75] 71 (08/24 0805) Cardiac Rhythm: Sinus bradycardia (08/23 2000) Resp:  [13-23] 18 (08/24 0805) BP: (77-165)/(50-104) 156/84 (08/24 0753) SpO2:  [92 %-98 %] 94 % (08/24 0807)  Intake/Output from previous day: 08/23 0701 - 08/24 0700 In: 240 [P.O.:240] Out: 550 [Urine:450; Chest Tube:100]  General appearance: alert, cooperative, and no distress Heart: regular rate and rhythm Lungs: clear to auscultation bilaterally Abdomen: soft, non-tender; bowel sounds normal; no masses,  no organomegaly Extremities: extremities normal, atraumatic, no cyanosis or edema Wound: clean and dry  Lab Results: Recent Labs    07/26/22 0315 07/27/22 0629  WBC  --  10.8*  HGB 12.2 12.4  HCT 36.0 37.5  PLT  --  210   BMET:  Recent Labs    07/26/22 0106 07/26/22 0315 07/27/22 0629  NA 139 138 137  K 4.1 3.8 4.2  CL 103  --  104  CO2 28  --  26  GLUCOSE 113*  --  104*  BUN 11  --  9  CREATININE 0.85  --  0.67  CALCIUM 9.4  --  9.7    PT/INR: No results for input(s): "LABPROT", "INR" in the last 72 hours. ABG    Component Value Date/Time   PHART 7.382 07/26/2022 0315   HCO3 33.2 (H) 07/26/2022 0315   TCO2 35 (H) 07/26/2022 0315   O2SAT 97 07/26/2022 0315   CBG (last 3)  Recent Labs    07/27/22 1711 07/27/22 2131 07/28/22 0643  GLUCAP 85 113* 114*    Assessment/Plan: S/P Procedure(s) (LRB): XI ROBOTIC ASSISTED THORACOSCOPY-RIGHT  MIDDLE AND UPPER LOBE WEDGE RESECTION, RIGHT MIDDLE LOBECTOMY (Right) VIDEO BRONCHOSCOPY WITH ENDOBRONCHIAL NAVIGATION (N/A) INTERCOSTAL NERVE BLOCK (Right) LYMPH NODE DISSECTION (Right)  CV- NSR, + HTN- will resume home Cozaar, HCTZ Pulm- no air leak from chest tube on water seal, CXR shows improvement of pneumothorax, likely d/c chest tube today Neuro-no further AMS, neuro has signed off Lovenox for DVT prophylaxis Dispo- patient stable, resume home antihypertensive agents, no air leak, CXR with improvement of right pneumothorax, can likely d/c tube today, if remains stable possible for d/c tomorrow   LOS: 3 days   Ellwood Handler, PA-C 07/28/2022

## 2022-07-28 NOTE — Plan of Care (Signed)
?  Problem: Clinical Measurements: ?Goal: Will remain free from infection ?Outcome: Progressing ?  ?

## 2022-07-28 NOTE — Progress Notes (Signed)
Removed right flank chest tube. Patient tolerated procedure well and grateful to have tube out. 4X4 gauze and tape applied. Radiology called and aware CXR is needed. Pt resting with call bell within reach.  Will continue to monitor.

## 2022-07-29 ENCOUNTER — Inpatient Hospital Stay (HOSPITAL_COMMUNITY): Payer: Medicare HMO

## 2022-07-29 LAB — GLUCOSE, CAPILLARY: Glucose-Capillary: 104 mg/dL — ABNORMAL HIGH (ref 70–99)

## 2022-07-29 MED ORDER — OXYCODONE HCL 5 MG PO TABS: 5.0000 mg | ORAL_TABLET | ORAL | 0 refills | Status: DC | PRN

## 2022-07-29 NOTE — Care Management Important Message (Signed)
Important Message  Patient Details  Name: Vicki Blankenship MRN: [id number] Date of Birth: 05/12/1956   Medicare Important Message Given:  Yes     Shelda Altes 07/29/2022, 10:58 AM

## 2022-07-29 NOTE — Progress Notes (Signed)
      [address]       [place] [address]             [phone]      4 Days Post-Op Procedure(s) (LRB): XI ROBOTIC ASSISTED THORACOSCOPY-RIGHT MIDDLE AND UPPER LOBE WEDGE RESECTION, RIGHT MIDDLE LOBECTOMY (Right) VIDEO BRONCHOSCOPY WITH ENDOBRONCHIAL NAVIGATION (N/A) INTERCOSTAL NERVE BLOCK (Right) LYMPH NODE DISSECTION (Right)  Subjective:  Sitting on side of bed.  No complaints.  Overall doing well, ready to go home.  Objective: Vital signs in last 24 hours: Temp:  [97.4 F (36.3 C)-98.8 F (37.1 C)] 98.8 F (37.1 C) (08/25 0417) Pulse Rate:  [60-76] 76 (08/25 0417) Cardiac Rhythm: Normal sinus rhythm (08/25 0340) Resp:  [14-20] 18 (08/25 0417) BP: (133-165)/(74-96) 145/92 (08/25 0417) SpO2:  [94 %-99 %] 94 % (08/25 0417)  Intake/Output from previous day: 08/24 0701 - 08/25 0700 In: 1020 [P.O.:1020] Out: -   General appearance: alert, cooperative, and no distress Heart: regular rate and rhythm Lungs: clear to auscultation bilaterally Abdomen: soft, non-tender; bowel sounds normal; no masses,  no organomegaly Extremities: extremities normal, atraumatic, no cyanosis or edema Wound: clean and dry  Lab Results: Recent Labs    07/27/22 0629  WBC 10.8*  HGB 12.4  HCT 37.5  PLT 210   BMET:  Recent Labs    07/27/22 0629  NA 137  K 4.2  CL 104  CO2 26  GLUCOSE 104*  BUN 9  CREATININE 0.67  CALCIUM 9.7    PT/INR: No results for input(s): "LABPROT", "INR" in the last 72 hours. ABG    Component Value Date/Time   PHART 7.382 07/26/2022 0315   HCO3 33.2 (H) 07/26/2022 0315   TCO2 35 (H) 07/26/2022 0315   O2SAT 97 07/26/2022 0315   CBG (last 3)  Recent Labs    07/28/22 1650 07/28/22 2053 07/29/22 0610  GLUCAP 85 113* 104*    Assessment/Plan: S/P Procedure(s) (LRB): XI ROBOTIC ASSISTED THORACOSCOPY-RIGHT MIDDLE AND UPPER LOBE WEDGE RESECTION, RIGHT MIDDLE LOBECTOMY (Right) VIDEO BRONCHOSCOPY WITH ENDOBRONCHIAL NAVIGATION  (N/A) INTERCOSTAL NERVE BLOCK (Right) LYMPH NODE DISSECTION (Right)  CV- NSR, H/O HTN- home Cozaar, HCTZ Pulm- CT removed yesterday, CXR with stable appearance of apical space Neuro- remains neurologically intact, no further AMS Dispo- patient stable, will d/c home today   LOS: 4 days   Ellwood Handler, PA-C 07/29/2022

## 2022-07-29 NOTE — Progress Notes (Signed)
Patient given discharge instructions and stated understanding. 

## 2022-07-29 NOTE — Plan of Care (Signed)

## 2022-08-01 ENCOUNTER — Other Ambulatory Visit: Payer: Self-pay | Admitting: Thoracic Surgery (Cardiothoracic Vascular Surgery)

## 2022-08-01 DIAGNOSIS — C3491 Malignant neoplasm of unspecified part of right bronchus or lung: Secondary | ICD-10-CM

## 2022-08-01 DIAGNOSIS — C3490 Malignant neoplasm of unspecified part of unspecified bronchus or lung: Secondary | ICD-10-CM

## 2022-08-05 ENCOUNTER — Other Ambulatory Visit: Payer: Self-pay | Admitting: *Deleted

## 2022-08-05 NOTE — Progress Notes (Signed)
The proposed treatment discussed in conference is for discussion purpose only and is not a binding recommendation.  The patients have not been physically examined, or presented with their treatment options.  Therefore, final treatment plans cannot be decided.  

## 2022-08-09 ENCOUNTER — Encounter: Payer: Self-pay | Admitting: Thoracic Surgery (Cardiothoracic Vascular Surgery)

## 2022-08-09 ENCOUNTER — Ambulatory Visit
Admission: RE | Admit: 2022-08-09 | Discharge: 2022-08-09 | Disposition: A | Discharge status: Home / Self Care | Payer: Medicare HMO | Source: Ambulatory Visit | Attending: Thoracic Surgery (Cardiothoracic Vascular Surgery) | Admitting: Thoracic Surgery (Cardiothoracic Vascular Surgery)

## 2022-08-09 ENCOUNTER — Ambulatory Visit (INDEPENDENT_AMBULATORY_CARE_PROVIDER_SITE_OTHER): Payer: Self-pay | Admitting: Thoracic Surgery (Cardiothoracic Vascular Surgery)

## 2022-08-09 VITALS — BP 134/85 | HR 67 | Resp 20 | Ht 64.0 in | Wt 136.4 lb

## 2022-08-09 DIAGNOSIS — C3490 Malignant neoplasm of unspecified part of unspecified bronchus or lung: Secondary | ICD-10-CM

## 2022-08-09 DIAGNOSIS — Z9889 Other specified postprocedural states: Secondary | ICD-10-CM

## 2022-08-09 NOTE — Progress Notes (Signed)
[address]       [place] [address]             [phone]    HPI: Vicki Blankenship returns for a scheduled postoperative follow-up visit  Vicki Blankenship is a 66 year old woman with a history of tobacco abuse, COPD, type 2 diabetes, hypertension, hyperlipidemia, bipolar disorder, meningioma, head injury, PTSD, seizure history, arthritis, osteopenia, reflux, and depression.  She was found to have a 12 mm right middle lobe nodule on a low-dose screening CT.  There also was a small peripheral right upper lobe nodule.  On PET/CT the middle lobe nodule was hypermetabolic.  I did a right upper lobe wedge resection and right middle lobectomy on 07/25/2022.  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  The upper lobe nodule showed fibrosis with atypical pneumocyte proliferation.  Her postoperative course was uncomplicated and she went home on day 4.  Since discharge she has been doing well.  She does have some discomfort and some numbness and pain in her right upper quadrant.  Appetite has not returned to normal yet.  Overall pain well managed and no respiratory issues.  Past Medical History:  Diagnosis Date   Anemia    Anxiety    Aortic atherosclerosis (HCC)    Arthritis    Bipolar disorder ([place])    Chronic bronchitis with COPD (chronic obstructive pulmonary disease) (HCC)    COPD (chronic obstructive pulmonary disease) (HCC)    Depression    Diabetes (HCC)    Type II   Diabetes mellitus, type II (HCC)    Dyspnea    GERD (gastroesophageal reflux disease)    Head injury    Headache    migraines- 3 times a week   HLD (hyperlipidemia)    HTN (hypertension)    Lesion of lung 2023   suspect lung cancer per pt   LOC (loss of consciousness) (HCC)    Meningioma (HCC)    L frontal   Pneumonia    PTSD (post-traumatic stress disorder)    Seizure (HCC)    hx of    Current Outpatient Medications  Medication Sig Dispense Refill    albuterol (VENTOLIN HFA) 108 (90 Base) MCG/ACT inhaler Inhale 1-2 puffs into the lungs every 6 (six) hours as needed for wheezing or shortness of breath.     aspirin 81 MG chewable tablet Chew 81 mg by mouth daily.     atorvastatin (LIPITOR) 40 MG tablet Take 40 mg by mouth daily.     butalbital-acetaminophen-caffeine (FIORICET) 50-325-40 MG tablet Take 1 tablet by mouth every 4 (four) hours as needed for migraine.     clonazePAM (KLONOPIN) 0.5 MG tablet Take 0.5 mg by mouth 3 (three) times daily.     doxycycline (VIBRA-TABS) 100 MG tablet Take 1 tablet (100 mg total) by mouth 2 (two) times daily. 20 tablet 0   DULoxetine (CYMBALTA) 30 MG capsule Take 30 mg by mouth daily at 12 noon.     DULoxetine (CYMBALTA) 60 MG capsule Take 60 mg by mouth in the morning.     Erenumab-aooe (AIMOVIG) 70 MG/ML SOAJ Inject 70 mg into the skin every 30 (thirty) days. 1.12 mL 6   Erenumab-aooe (AIMOVIG) 70 MG/ML SOAJ Inject 70 mg into the skin every 30 (thirty) days. 1.12 mL 0   Glycopyrrolate-Formoterol (BEVESPI AEROSPHERE) 9-4.8 MCG/ACT AERO Take 2 puffs first thing in am and then another 2 puffs about  12 hours later. 1 each 11   hydrochlorothiazide (HYDRODIURIL) 25 MG tablet Take 25 mg by mouth daily.     lithium carbonate 300 MG capsule Take 300 mg by mouth in the morning and at bedtime.     losartan (COZAAR) 25 MG tablet Take 25 mg by mouth daily.     metFORMIN (GLUCOPHAGE) 500 MG tablet Take 500 mg by mouth daily with breakfast.     Multiple Vitamin (MULTIVITAMIN ADULT PO) Take 1 tablet by mouth daily.     naloxone (NARCAN) 4 MG/0.1ML LIQD nasal spray kit Place 1 spray into the nose as needed (opioid overdose).     oxyCODONE (OXY IR/ROXICODONE) 5 MG immediate release tablet Take 1 tablet (5 mg total) by mouth every 4 (four) hours as needed for moderate pain. 30 tablet 0   OXYGEN Inhale 2 L into the lungs continuous.     pantoprazole (PROTONIX) 40 MG tablet Take 1 tablet (40 mg total) by mouth daily before  breakfast. 90 tablet 3   QUEtiapine (SEROQUEL) 400 MG tablet Take 400 mg by mouth at bedtime.     QUEtiapine (SEROQUEL) 50 MG tablet Take 50 mg by mouth every morning.     Rimegepant Sulfate (NURTEC) 75 MG TBDP Take 75 mg by mouth as needed. Max dose one pill in 24 hours 8 tablet 6   Rimegepant Sulfate (NURTEC) 75 MG TBDP Take 75 mg by mouth as needed. 4 tablet 0   vitamin B-12 (CYANOCOBALAMIN) 1000 MCG tablet Take 1,000 mcg by mouth daily.     vitamin C (ASCORBIC ACID) 500 MG tablet Take 500 mg by mouth daily.     Vitamin D, Cholecalciferol, 25 MCG (1000 UT) TABS Take 1,000 Units by mouth daily.     No current facility-administered medications for this visit.    Physical Exam BP 134/85 (BP Location: Left Arm, Patient Position: Sitting, Cuff Size: Normal)   Pulse 67   Resp 20   Ht _0  (1.626 m)   Wt 136 lb 6.4 oz (61.9 kg)   SpO2 93% Comment: RA  BMI 23.51 kg/m  66 year old woman in no acute distress Alert and oriented x3 with no focal deficits Lungs clear with equal breath sounds bilaterally Incisions clean dry and intact Cardiac regular rate and rhythm  Diagnostic Tests: CHEST - 2 VIEW   COMPARISON:  07/29/2022 from [place]: The heart size and mediastinal contours are within normal limits. Previously seen small right pneumothorax has resolved. Both lungs are clear. No evidence of pleural effusion.   IMPRESSION: Resolution of right pneumothorax. No active cardiopulmonary disease.     Electronically Signed   By: Marlaine Hind M.D.   On: 08/09/2022 13:48   I personally reviewed the chest x-ray images.  There are postoperative changes.  No worrisome findings.  Impression:  Vicki Blankenship is a 66 year old woman with a history of tobacco abuse, COPD, type 2 diabetes, hypertension, hyperlipidemia, bipolar disorder, meningioma, head injury, PTSD, seizure history, arthritis, osteopenia, reflux, and depression.  She was found to have a 12 mm right middle  lobe nodule on a low-dose screening CT.  There also was a small peripheral right upper lobe nodule.  On PET/CT the middle lobe nodule was hypermetabolic.  She underwent a robotic assisted right middle lobectomy and node dissection along with a right upper lobe wedge resection on 07/25/2022.  Her postoperative course was unremarkable and she went home on day 4.  She continues doing well.  She is  having minimal discomfort.  The symptoms she is having with the numbness and discomfort in the right upper quadrant is normal for this type of surgery.  It should improve with time.  There are no restrictions on her activities but she was cautioned to build into new activities gradually.  Importance of smoking cessation was emphasized.  Plan: Refer to Dr. Delton Coombes I will plan to see her back in 2 months with a PA and lateral chest x-ray to check on her progress  [name], MD [facility] [phone]

## 2022-08-11 ENCOUNTER — Other Ambulatory Visit: Payer: Self-pay | Admitting: Thoracic Surgery (Cardiothoracic Vascular Surgery)

## 2022-08-11 MED ORDER — OXYCODONE HCL 5 MG PO TABS: 5.0000 mg | ORAL_TABLET | Freq: Four times a day (QID) | ORAL | 0 refills | Status: DC | PRN

## 2022-08-11 NOTE — Progress Notes (Signed)
Refilled oxycodone 5 mg, 1 p.o. every 6 hours as needed for moderate severe pain, 25 tablets, no refills.  Revonda Standard Roxan Hockey, MD [facility] [phone]

## 2022-08-12 ENCOUNTER — Telehealth: Payer: Self-pay

## 2022-08-12 NOTE — Telephone Encounter (Signed)
-----   Message from [name], MD sent at 08/11/2022  5:52 PM EDT ----- Regarding: RE: pain med refill Contact: [phone] I sent a script  [facility] ----- Message ----- From: Marylen Ponto, LPN Sent: 07/06/600   9:40 AM EDT To: [name], MD Subject: pain med refill                                Mrs Hunger is calling for a pain med refill-Oxycodone. Her pharm is noted in epic chart. Please advise. SW

## 2022-08-16 ENCOUNTER — Encounter: Payer: Self-pay | Admitting: Internal Medicine

## 2022-08-16 ENCOUNTER — Ambulatory Visit: Payer: Medicare HMO | Admitting: Internal Medicine

## 2022-08-16 DIAGNOSIS — J9612 Chronic respiratory failure with hypercapnia: Secondary | ICD-10-CM

## 2022-08-16 DIAGNOSIS — R918 Other nonspecific abnormal finding of lung field: Secondary | ICD-10-CM

## 2022-08-16 DIAGNOSIS — J9611 Chronic respiratory failure with hypoxia: Secondary | ICD-10-CM

## 2022-08-16 DIAGNOSIS — J449 Chronic obstructive pulmonary disease, unspecified: Secondary | ICD-10-CM

## 2022-08-16 NOTE — Patient Instructions (Signed)
Plan A = Automatic = Always=    Breztri Take 2 puffs first thing in am and then another 2 puffs about 12 hours later.   Work on inhaler technique:  relax and gently blow all the way out then take a nice smooth full deep breath back in, triggering the inhaler at same time you start breathing in.  Hold breath in for at least  5 seconds if you can. Blow out breztri thru nose. Rinse and gargle with water when done.  If mouth or throat bother you at all,  try brushing teeth/gums/tongue with arm and hammer toothpaste/ make a slurry and gargle and spit out.      Plan B = Backup (to supplement plan A, not to replace it) Only use your albuterol inhaler as a rescue medication to be used if you can't catch your breath by resting or doing a relaxed purse lip breathing pattern.  - The less you use it, the better it will work when you need it. - Ok to use the inhaler up to 2 puffs  every 4 hours if you must but call for appointment if use goes up over your usual need - Don't leave home without it !!  (think of it like the spare tire for your car)   Please schedule a follow up visit in 3 months but call sooner if needed

## 2022-08-16 NOTE — Progress Notes (Signed)
Vicki Blankenship, female    DOB: October 21, 1956    MRN: [id number]   Brief patient profile:  54  yowf Quit smoking 07/2022 from [place]  grew up in house with bronchitis/ear infections referred to pulmonary clinic in [place]  04/18/2022 by Dr Yong Channel.  Daily symptoms since 2011 placed on advair > thrush and since around 2012 just on albuterol and 02 hs since 2010    History of Present Illness  04/18/2022  Pulmonary/ 1st office eval/ Tr[facility]bottom / [place] [facility]  Chief Complaint  Patient presents with   Consult    Hx of COPD diagnosed around 2010.   Dyspnea:  [employer] [address]/ chest tightness walking to mailbox x years 200 ft uphill  to mailbox  Cough: worse since pna dec 2022 esp first thing worse x sev hours x brownish slt bloody mucus and epistaxis since jan 2023  Sleep: on 02 2lpm / flat bed 2 pillows   SABA use: once daily at most but very poor hfa 0 2  2lpm hs but not with activity  Rec We will call to get your last pft from [place] if available  Plan A = Automatic = Always=    Symbicort  80 (for Breztri) Take 2 puffs first thing in am and then another 2 puffs about 12 hours later.   Work on inhaler technique:  Zpak should improve your cough  Plan B = Backup (to supplement plan A, not to replace it) Only use your albuterol inhaler as a rescue medication Make sure you check your oxygen saturation  AT  your highest level of activity (not after you stop)   to be sure it stays over 90%  Suggested e-cigs as an optional  "one way bridge"  Off all tobacco products    CT chest 04/18/22  LDSCT Lung-RADS 4B, suspicious. Additional imaging evaluation or consultation with Pulmonology or Thoracic Surgery recommended. Right middle and right upper lobe pulmonary nodules, suspicious for primary bronchogenic carcinoma or carcinomas. Consider PET of the right middle lobe nodule. The right upper lobe nodule is at the low end of PET resolution. 2. No thoracic adenopathy. 3. Age advanced coronary artery  atherosclerosis. Recommend assessment of coronary risk factors. 4. Aortic atherosclerosis (ICD10-I70.0) and emphysema (ICD10-J43.9).  PET 06/02/22  1. Hypermetabolic medial right middle lobe nodule, high suspicion for malignancy. 2. The 6 by 4 mm right upper lobe nodule is not discernibly Hypermetabolic  3. Borderline enlarged right lower paratracheal lymph node has a maximum SUV of 2.4, equal to the blood pool. This tends to favor benign process.   06/06/2022  f/u ov/[place] office/[facility]ton Waight re: GOLD 0 copd/ RML nodule  maint on nothing since had to stop symbicort 80  due to mouth irritation   Chief Complaint  Patient presents with   Follow-up    Wants to talk about pet scan done on 6/29  Dyspnea:  [employer] shopping / uses [facility] parking due to  back problems  Cough: mucus is still green in am assoc with "terrible sinuses"  Sleeping: 2lpm flat bed coughing disturbs  SABA use: not using  02: 2lpm hs  Covid status: vax 5 / never infected  Rec Doxycycline 100 mg twice daily x 10 daily  Plan A = Automatic = Always=    Bevespi 2 puffs 1st thing in am and 12 hours later Work on inhaler technique:    Plan B = Backup (to supplement plan A, not to replace it) Only use your albuterol inhaler  as a rescue medication  For cough > mucinex dm 1200 mg every hours as needed  Prednisone 10 mg take  4 each am x 2 days,   2 each am x 2 days,  1 each am x 2 days and stop     The key is to stop smoking completely before smoking completely stops you!    08/16/2022  f/u ov/[place] office/Eniola Cerullo re: GOLD 2 copd  maint on breztri 2bid   Chief Complaint  Patient presents with   Follow-up    She had her PFT on 07/05/22. She had her procedure a few weeks backs and she has improved with her breathing, she is staying in the 90's oxygen levels.   Dyspnea:  Not limited by breathing from desired activities  / less active since surgery  Cough: none  Sleeping: flat bed/ 2 pillows  SABA use: none  02: 2lpm hs  not daytime      No obvious day to day or daytime variability or assoc excess/ purulent sputum or mucus plugs or hemoptysis or cp or chest tightness, subjective wheeze or overt sinus or hb symptoms.   Sleeping  without nocturnal  or early am exacerbation  of respiratory  c/o's or need for noct saba. Also denies any obvious fluctuation of symptoms with weather or environmental changes or other aggravating or alleviating factors except as outlined above   No unusual exposure hx or h/o childhood pna/ asthma or knowledge of premature birth.  Current Allergies, Complete Past Medical History, Past Surgical History, Family History, and Social History were reviewed in [employer] record.  ROS  The following are not active complaints unless bolded Hoarseness, sore throat, dysphagia, dental problems, itching, sneezing,  nasal congestion or discharge of excess mucus or purulent secretions, ear ache,   fever, chills, sweats, unintended wt loss or wt gain, classically pleuritic or exertional cp,  orthopnea pnd or arm/hand swelling  or leg swelling, presyncope, palpitations, abdominal pain, anorexia, nausea, vomiting, diarrhea  or change in bowel habits or change in bladder habits, change in stools or change in urine, dysuria, hematuria,  rash, arthralgias, visual complaints, headache, numbness, weakness or ataxia or problems with walking or coordination,  change in mood or  memory.        Current Meds  Medication Sig   albuterol (VENTOLIN HFA) 108 (90 Base) MCG/ACT inhaler Inhale 1-2 puffs into the lungs every 6 (six) hours as needed for wheezing or shortness of breath.   aspirin 81 MG chewable tablet Chew 81 mg by mouth daily.   atorvastatin (LIPITOR) 40 MG tablet Take 40 mg by mouth daily.   clonazePAM (KLONOPIN) 0.5 MG tablet Take 0.5 mg by mouth 3 (three) times daily.   DULoxetine (CYMBALTA) 30 MG capsule Take 30 mg by mouth daily at 12 noon.   DULoxetine (CYMBALTA) 60 MG capsule  Take 60 mg by mouth in the morning.   Erenumab-aooe (AIMOVIG) 70 MG/ML SOAJ Inject 70 mg into the skin every 30 (thirty) days.   Erenumab-aooe (AIMOVIG) 70 MG/ML SOAJ Inject 70 mg into the skin every 30 (thirty) days.   Glycopyrrolate-Formoterol (BEVESPI AEROSPHERE) 9-4.8 MCG/ACT AERO Take 2 puffs first thing in am and then another 2 puffs about 12 hours later.   hydrochlorothiazide (HYDRODIURIL) 25 MG tablet Take 25 mg by mouth daily.   lithium carbonate 300 MG capsule Take 300 mg by mouth in the morning and at bedtime.   losartan (COZAAR) 25 MG tablet Take 25 mg by mouth  daily.   metFORMIN (GLUCOPHAGE) 500 MG tablet Take 500 mg by mouth daily with breakfast.   Multiple Vitamin (MULTIVITAMIN ADULT PO) Take 1 tablet by mouth daily.   naloxone (NARCAN) 4 MG/0.1ML LIQD nasal spray kit Place 1 spray into the nose as needed (opioid overdose).   oxyCODONE (OXY IR/ROXICODONE) 5 MG immediate release tablet Take 1 tablet (5 mg total) by mouth every 6 (six) hours as needed for moderate pain or severe pain.   OXYGEN Inhale 2 L into the lungs continuous.   pantoprazole (PROTONIX) 40 MG tablet Take 1 tablet (40 mg total) by mouth daily before breakfast.   QUEtiapine (SEROQUEL) 400 MG tablet Take 400 mg by mouth at bedtime.   QUEtiapine (SEROQUEL) 50 MG tablet Take 50 mg by mouth every morning.   Rimegepant Sulfate (NURTEC) 75 MG TBDP Take 75 mg by mouth as needed. Max dose one pill in 24 hours   vitamin B-12 (CYANOCOBALAMIN) 1000 MCG tablet Take 1,000 mcg by mouth daily.   vitamin C (ASCORBIC ACID) 500 MG tablet Take 500 mg by mouth daily.   Vitamin D, Cholecalciferol, 25 MCG (1000 UT) TABS Take 1,000 Units by mouth daily.                            Past Medical History:  Diagnosis Date   Anxiety    Arthritis    Bipolar disorder (HCC)    COPD (chronic obstructive pulmonary disease) (HCC)    Depression    Diabetes (HCC)    Type II   Diabetes mellitus, type II (HCC)    Dyspnea    GERD  (gastroesophageal reflux disease)    Headache    migraines- 3 times a week   HLD (hyperlipidemia)    HTN (hypertension)    Pneumonia    PTSD (post-traumatic stress disorder)        Objective:      08/16/2022        138   06/06/22 143 lb 3.2 oz (65 kg)  05/19/22 149 lb 14.6 oz (68 kg)  04/22/22 150 lb (68 kg)    Vital signs reviewed  08/16/2022  - Note at rest 02 sats  98% on RA   General appearance:    amb slt hoarse wf nad     HEENT : Oropharynx  clear/ edentulous  Nasal turbinates nl    NECK :  without  apparent JVD/ palpable Nodes/TM    LUNGS: no acc muscle use,  Min barrel  contour chest wall with bilateral  slightly decreased bs s audible wheeze and  without cough on insp or exp maneuvers and min  Hyperresonant  to  percussion bilaterally    CV:  RRR  no s3 or murmur or increase in P2, and no edema   ABD:  soft and nontender with pos end  insp Hoover's  in the supine position.  No bruits or organomegaly appreciated   MS:  Nl gait/ ext warm without deformities Or obvious joint restrictions  calf tenderness, cyanosis or clubbing     SKIN: warm and dry without lesions    NEURO:  alert, approp, nl sensorium with  no motor or cerebellar deficits apparent.            I personally reviewed images and agree with radiology impression as follows:  CXR:   08/09/22 Resolution of right pneumothorax. No active cardiopulmonary disease.       Assessment

## 2022-08-16 NOTE — Assessment & Plan Note (Signed)
CT chest 04/18/22  LDSCT Lung-RADS 4B, suspicious. Additional imaging evaluation or consultation with Pulmonology or Thoracic Surgery recommended. Right middle and right upper lobe pulmonary nodules, suspicious for primary bronchogenic carcinoma or carcinomas. Consider PET of the right middle lobe nodule. The right upper lobe nodule is at the low end of PET resolution. 2. No thoracic adenopathy. 3. Age advanced coronary artery atherosclerosis. Recommend assessment of coronary risk factors. 4. Aortic atherosclerosis (ICD10-I70.0) and emphysema (ICD10-J43.9).  PET 06/02/22  1. Hypermetabolic medial right middle lobe nodule, high suspicion for malignancy. 2. The 6 by 4 mm right upper lobe nodule is not discernibly Hypermetabolic  3. Borderline enlarged right lower paratracheal lymph node has a maximum SUV of 2.4, equal to the blood pool. This tends to favor benign process. >>> right upper lobe wedge resection and right middle lobectomy on 07/25/2022. Stage 1A sq cell per DR Roxan Hockey, no adjuvant rx  rec

## 2022-08-16 NOTE — Assessment & Plan Note (Addendum)
Quit smoking 07/2022  - PFTs 06/04/15 no significant obstruction/ ERV 20% at wt 189 / nl dlco  - Holter 01/22/16 x 24 h  Nl but no symptoms reported during monitor - 04/18/2022  After extensive coaching inhaler device,  effectiveness =    50% > try symbicort 80 2bid due to thrush on advair previously plus approp saba > could not tolerate oral side effects of ics - 06/06/2022   > try Bevespi 2 bid and practice with empty Breztri container - PFT's  07/05/22  FEV1 1.86 (76 % ) ratio 0.65  p 7 % improvement from saba p BREO prior to study with DLCO  14.95 (75%)   and FV curve mild concavity   - 08/16/2022  After extensive coaching inhaler device,  effectiveness =    80%   Group D (now reclassified as E) in terms of symptom/risk and laba/lama/ICS  therefore appropriate rx at this point >>>  breztr 2bid and approp saba    Each maintenance medication was reviewed in detail including emphasizing most importantly the difference between maintenance and prns and under what circumstances the prns are to be triggered using an action plan format where appropriate.  Total time for H and P, chart review, counseling, reviewing hfa/02 device(s) , directly observing portions of ambulatory 02 saturation study/ and generating customized AVS unique to this office visit / same day charting > 30 min post op f/u ov

## 2022-08-16 NOTE — Assessment & Plan Note (Signed)
[facility]03   On 03/01/22 =  31 but as high as 35 in 2001  -  04/18/2022   Walked on RA  x  3  lap(s) =  approx 450  ft  @ moderate pace, stopped due to end of study with lowest 02 sats 91% cc sob @ 3rd lap  - 06/06/2022   Walked on RA   x  3  lap(s) =  approx 450  ft  @ fast pace, stopped due to end of walk with lowest 02 sats 90% with sob on 3rd lap   -08/16/2022   Walked on RA  x  3  lap(s) =  approx 450  ft  @ mod pace, stopped due to end of study s sob with lowest 02 sats 96%   As of 08/16/2022 rx = 2lpm hs/ not needing daytime

## 2022-08-29 ENCOUNTER — Inpatient Hospital Stay: Payer: Medicare HMO | Attending: Hematology | Admitting: Hematology

## 2022-08-29 ENCOUNTER — Encounter: Payer: Self-pay | Admitting: Hematology

## 2022-08-29 DIAGNOSIS — Z800 Family history of malignant neoplasm of digestive organs: Secondary | ICD-10-CM | POA: Diagnosis not present

## 2022-08-29 DIAGNOSIS — G629 Polyneuropathy, unspecified: Secondary | ICD-10-CM | POA: Insufficient documentation

## 2022-08-29 DIAGNOSIS — Z803 Family history of malignant neoplasm of breast: Secondary | ICD-10-CM | POA: Insufficient documentation

## 2022-08-29 DIAGNOSIS — Z79899 Other long term (current) drug therapy: Secondary | ICD-10-CM | POA: Insufficient documentation

## 2022-08-29 DIAGNOSIS — Z8042 Family history of malignant neoplasm of prostate: Secondary | ICD-10-CM | POA: Insufficient documentation

## 2022-08-29 DIAGNOSIS — Z9981 Dependence on supplemental oxygen: Secondary | ICD-10-CM | POA: Insufficient documentation

## 2022-08-29 DIAGNOSIS — Z808 Family history of malignant neoplasm of other organs or systems: Secondary | ICD-10-CM | POA: Insufficient documentation

## 2022-08-29 DIAGNOSIS — Z87891 Personal history of nicotine dependence: Secondary | ICD-10-CM | POA: Diagnosis not present

## 2022-08-29 DIAGNOSIS — Z85118 Personal history of other malignant neoplasm of bronchus and lung: Secondary | ICD-10-CM | POA: Diagnosis present

## 2022-08-29 DIAGNOSIS — Z902 Acquired absence of lung [part of]: Secondary | ICD-10-CM | POA: Insufficient documentation

## 2022-08-29 DIAGNOSIS — C3491 Malignant neoplasm of unspecified part of right bronchus or lung: Secondary | ICD-10-CM | POA: Insufficient documentation

## 2022-08-29 MED ORDER — GABAPENTIN 300 MG PO CAPS: 300.0000 mg | ORAL_CAPSULE | Freq: Every day | ORAL | 3 refills | Status: DC

## 2022-08-29 MED ORDER — GABAPENTIN 300 MG PO CAPS: 300.0000 mg | ORAL_CAPSULE | Freq: Two times a day (BID) | ORAL | 3 refills | Status: DC

## 2022-08-29 NOTE — Progress Notes (Signed)
I met with the patient and her husband today during and following initial visit with Dr. Katragadda. I introduced myself and explained my role in the patient's care. I provided my contact information and encouraged the patient to call with questions or concerns. 

## 2022-08-29 NOTE — Patient Instructions (Addendum)
[facility]  Discharge Instructions  You were seen and examined today by Dr. Delton Coombes. Dr. Delton Coombes is a medical oncologist, meaning that he specializes in the treatment of cancer diagnoses. Dr. Delton Coombes discussed your past medical history, family history of cancers, and the events that led to you being here today.  You were referred to Dr. 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 The cancer was completely removed during surgery and there was no lymph node involvement. This is great news.  Dr. Delton Coombes has recommended routine follow-ups with CT scans.   Dr. 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 This may also help the nerve pain you are experiencing in the chest wall.  Follow-up as scheduled.  Thank you for choosing [facility] to provide your oncology and hematology care.   To afford each patient quality time with our provider, please arrive at least 15 minutes before your scheduled appointment time. You may need to reschedule your appointment if you arrive late (10 or more minutes). Arriving late affects you and other patients whose appointments are after yours.  Also, if you miss three or more appointments without notifying the office, you may be dismissed from the clinic at the provider's discretion.    Again, thank you for choosing [facility].  Our hope is that these requests will decrease the amount of time that you wait before being seen by our physicians.   If you have a lab appointment with the [facility] please come in thru the Main Entrance and check in at the main information desk.           _____________________________________________________________  Should you have questions after your visit to [facility], please contact our office at [phone] and follow  the prompts.  Our office hours are 8:00 a.m. to 4:30 p.m. Monday - Thursday and 8:00 a.m. to 2:30 p.m. Friday.  Please note that voicemails left after 4:00 p.m. may not be returned until the following business day.  We are closed weekends and all major holidays.  You do have access to a nurse 24-7, just call the main number to the clinic [phone] and do not press any options, hold on the line and a nurse will answer the phone.    For prescription refill requests, have your pharmacy contact our office and allow 72 hours.    Masks are optional in the cancer centers. If you would like for your care team to wear a mask while they are taking care of you, please let them know. You may have one support person who is at least 64 years old accompany you for your appointments.

## 2022-08-29 NOTE — Progress Notes (Signed)
[facility] NOTE  Patient Care Team: Tilda Burrow, NP as PCP - General (Family Medicine) Gala Romney Cristopher Estimable, MD as Consulting Physician (Gastroenterology) Derek Jack, MD as Medical Oncologist (Medical Oncology) Brien Mates, RN as Oncology Nurse Navigator (Medical Oncology)  CHIEF COMPLAINTS/PURPOSE OF CONSULTATION:  Newly diagnosed right middle lobe squamous cell carcinoma  HISTORY OF PRESENTING ILLNESS:  Vicki Blankenship 66 y.o. female is seen in consultation today for newly diagnosed right lung squamous cell carcinoma.  She had CT lung cancer screening scan on 04/18/2022 which showed lung RADS 4B with suspicious right middle lobe and right upper lobe lung nodules with no clear thoracic adenopathy.  This was followed with a PET scan on 06/02/2022 which showed hypermetabolic right middle lobe nodule measuring 1.3 x 0.7 cm.  There is a 6 x 4 mm right upper lobe nodule not hypermetabolic.  Borderline enlarged right lower lobe paratracheal lymph node with SUV 2.4.  She was evaluated by Dr. Roxan Hockey and underwent right lung middle lobectomy, upper lobe wedge resection, lymph node sampling on 07/25/2022.  She reports sharp pains in the right lateral chest wall since surgery.  These pains are worse on movement.  She has occasional urge to smoke.  She has contacted the [phone] quit smoking number.  She is currently using nicotine patches.  They are going to send her lozenges.  MEDICAL HISTORY:  Past Medical History:  Diagnosis Date   Anemia    Anxiety    Aortic atherosclerosis (HCC)    Arthritis    Bipolar disorder ([place])    Chronic bronchitis with COPD (chronic obstructive pulmonary disease) (HCC)    COPD (chronic obstructive pulmonary disease) (HCC)    Depression    Diabetes (HCC)    Type II   Diabetes mellitus, type II (HCC)    Dyspnea    GERD (gastroesophageal reflux disease)    Head injury    Headache    migraines- 3 times a week   HLD  (hyperlipidemia)    HTN (hypertension)    Lesion of lung 2023   suspect lung cancer per pt   LOC (loss of consciousness) ([place])    Meningioma (HCC)    L frontal   Pneumonia    PTSD (post-traumatic stress disorder)    Seizure ([place])    hx of    SURGICAL HISTORY: Past Surgical History:  Procedure Laterality Date   BIOPSY  05/23/2022   Procedure: BIOPSY;  Surgeon: Eloise Harman, DO;  [facility];  Service: Endoscopy;;   CARPAL TUNNEL RELEASE Right    CHOLECYSTECTOMY  2013   COLONOSCOPY  2015   In [place].  Per patient, she had colon polyps.   COLONOSCOPY WITH PROPOFOL N/A 05/23/2022   Procedure: COLONOSCOPY WITH PROPOFOL;  Surgeon: Eloise Harman, DO;  [facility];  Service: Endoscopy;  Laterality: N/A;  11:00am   ESOPHAGOGASTRODUODENOSCOPY (EGD) WITH PROPOFOL N/A 05/23/2022   Procedure: ESOPHAGOGASTRODUODENOSCOPY (EGD) WITH PROPOFOL;  Surgeon: Eloise Harman, DO;  [facility];  Service: Endoscopy;  Laterality: N/A;   FOOT SURGERY Left    INTERCOSTAL NERVE BLOCK Right 07/25/2022   Procedure: INTERCOSTAL NERVE BLOCK;  Surgeon: [name], MD;  Location: [facility];  Service: Thoracic;  Laterality: Right;   LUMBAR LAMINECTOMY/ DECOMPRESSION WITH MET-RX Left 04/14/2020   Procedure: Left Lumbar Four-Five Minimally invasive lumbar decompression;  Surgeon: Judith Part, MD;  Location: [facility];  Service: Neurosurgery;  Laterality: Left;  Left Lumbar Four-Five Minimally invasive lumbar decompression   LYMPH NODE DISSECTION Right 07/25/2022   Procedure: LYMPH NODE DISSECTION;  Surgeon: [name], MD;  Location: [facility] OR;  Service: Thoracic;  Laterality: Right;   POLYPECTOMY  05/23/2022   Procedure: POLYPECTOMY;  Surgeon: Eloise Harman, DO;  [facility];  Service: Endoscopy;;   thumb surgery Left    pin placed   TUBAL LIGATION     VIDEO BRONCHOSCOPY WITH ENDOBRONCHIAL NAVIGATION N/A 07/25/2022   Procedure: VIDEO  BRONCHOSCOPY WITH ENDOBRONCHIAL NAVIGATION;  Surgeon: [name], MD;  Location: [facility];  Service: Thoracic;  Laterality: N/A;    SOCIAL HISTORY: Social History   Socioeconomic History   Marital status: Married    Spouse name: Not on file   Number of children: 2   Years of education: Not on file   Highest education level: Not on file  Occupational History   Not on file  Tobacco Use   Smoking status: Former    Packs/day: 0.50    Years: 40.00    Total pack years: 20.00    Types: Cigarettes    Quit date: 07/15/2022    Years since quitting: 0.1   Smokeless tobacco: Never  Vaping Use   Vaping Use: Former  Substance and Sexual Activity   Alcohol use: Never   Drug use: Not Currently   Sexual activity: Yes  Other Topics Concern   Not on file  Social History Narrative   Lives with husband   Right handed   Caffeine: coffee 4 cups in AM, throughout day 2-3 sodas   Social Determinants of Health   Financial Resource Strain: Low Risk  (08/29/2022)   Overall Financial Resource Strain (CARDIA)    Difficulty of Paying Living Expenses: Not very hard  Food Insecurity: No Food Insecurity (08/29/2022)   Hunger Vital Sign    Worried About Running Out of Food in the Last Year: Never true    [place] in the Last Year: Never true  Transportation Needs: No Transportation Needs (08/29/2022)   PRAPARE - Hydrologist (Medical): No    Lack of Transportation (Non-Medical): No  Physical Activity: Insufficiently Active (08/29/2022)   Exercise Vital Sign    Days of Exercise per Week: 3 days    Minutes of Exercise per Session: 30 min  Stress: Stress Concern Present (08/29/2022)   [place]    Feeling of Stress : To some extent  Social Connections: Moderately Integrated (08/29/2022)   Social Connection and Isolation Panel [NHANES]    Frequency of Communication with Friends and Family: Three  times a week    Frequency of Social Gatherings with Friends and Family: Three times a week    Attends Religious Services: Never    Active Member of Clubs or Organizations: Yes    Attends Archivist Meetings: 1 to 4 times per year    Marital Status: Married  Human resources officer Violence: Not At Risk (08/29/2022)   Humiliation, Afraid, Rape, and Kick questionnaire    Fear of Current or Ex-Partner: No    Emotionally Abused: No    Physically Abused: No    Sexually Abused: No    FAMILY HISTORY: Family History  Problem Relation Age of Onset   Migraines Mother    Diabetes Mother    Dementia Mother    Cancer Father    Heart disease Father    Colon cancer  Neg Hx     ALLERGIES:  is allergic to penicillins and wellbutrin [bupropion].  MEDICATIONS:  Current Outpatient Medications  Medication Sig Dispense Refill   albuterol (VENTOLIN HFA) 108 (90 Base) MCG/ACT inhaler Inhale 1-2 puffs into the lungs every 6 (six) hours as needed for wheezing or shortness of breath.     aspirin 81 MG chewable tablet Chew 81 mg by mouth daily.     atorvastatin (LIPITOR) 40 MG tablet Take 40 mg by mouth daily.     clonazePAM (KLONOPIN) 0.5 MG tablet Take 0.5 mg by mouth 3 (three) times daily.     DULoxetine (CYMBALTA) 30 MG capsule Take 30 mg by mouth daily at 12 noon.     DULoxetine (CYMBALTA) 60 MG capsule Take 60 mg by mouth in the morning.     Glycopyrrolate-Formoterol (BEVESPI AEROSPHERE) 9-4.8 MCG/ACT AERO Take 2 puffs first thing in am and then another 2 puffs about 12 hours later. 1 each 11   hydrochlorothiazide (HYDRODIURIL) 25 MG tablet Take 25 mg by mouth daily.     lithium carbonate 300 MG capsule Take 300 mg by mouth in the morning and at bedtime.     losartan (COZAAR) 25 MG tablet Take 25 mg by mouth daily.     metFORMIN (GLUCOPHAGE) 500 MG tablet Take 500 mg by mouth daily with breakfast.     Multiple Vitamin (MULTIVITAMIN ADULT PO) Take 1 tablet by mouth daily.     naloxone (NARCAN) 4  MG/0.1ML LIQD nasal spray kit Place 1 spray into the nose as needed (opioid overdose).     OXYGEN Inhale 2 L into the lungs continuous.     pantoprazole (PROTONIX) 40 MG tablet Take 1 tablet (40 mg total) by mouth daily before breakfast. 90 tablet 3   QUEtiapine (SEROQUEL) 400 MG tablet Take 400 mg by mouth at bedtime.     QUEtiapine (SEROQUEL) 50 MG tablet Take 50 mg by mouth every morning.     Rimegepant Sulfate (NURTEC) 75 MG TBDP Take 75 mg by mouth as needed. Max dose one pill in 24 hours 8 tablet 6   vitamin B-12 (CYANOCOBALAMIN) 1000 MCG tablet Take 1,000 mcg by mouth daily.     vitamin C (ASCORBIC ACID) 500 MG tablet Take 500 mg by mouth daily.     Vitamin D, Cholecalciferol, 25 MCG (1000 UT) TABS Take 1,000 Units by mouth daily.     gabapentin (NEURONTIN) 300 MG capsule Take 1 capsule (300 mg total) by mouth 2 (two) times daily. 60 capsule 3   No current facility-administered medications for this visit.    REVIEW OF SYSTEMS:   Constitutional: Denies fevers, chills or abnormal night sweats Eyes: Denies blurriness of vision, double vision or watery eyes Ears, nose, mouth, throat, and face: Denies mucositis or sore throat Respiratory: Denies cough, dyspnea or wheezes.  Positive for right chest wall pain on certain movements. Cardiovascular: Denies palpitation, chest discomfort or lower extremity swelling Gastrointestinal:  Denies nausea, heartburn or change in bowel habits Skin: Denies abnormal skin rashes Lymphatics: Denies new lymphadenopathy or easy bruising Neurological: Positive for neuropathy in the hands and feet with feet worse than hands. Behavioral/Psych: Mood is stable, no new changes  All other systems were reviewed with the patient and are negative.  PHYSICAL EXAMINATION: ECOG PERFORMANCE STATUS: 1 - Symptomatic but completely ambulatory  Vitals:   08/29/22 0813  BP: (!) 145/86  Pulse: 64  Resp: 18  Temp: 97.6 F (36.4 C)  SpO2: 100%   Filed  Weights    08/29/22 0813  Weight: 141 lb 9.6 oz (64.2 kg)    GENERAL:alert, no distress and comfortable SKIN: skin color, texture, turgor are normal, no rashes or significant lesions EYES: normal, conjunctiva are pink and non-injected, sclera clear OROPHARYNX:no exudate, no erythema and lips, buccal mucosa, and tongue normal  NECK: supple, thyroid normal size, non-tender, without nodularity LYMPH:  no palpable lymphadenopathy in the cervical, axillary or inguinal LUNGS: clear to auscultation and percussion with normal breathing effort HEART: regular rate & rhythm and no murmurs and no lower extremity edema ABDOMEN:abdomen soft, non-tender and normal bowel sounds Musculoskeletal:no cyanosis of digits and no clubbing  PSYCH: alert & oriented x 3 with fluent speech NEURO: no focal motor/sensory deficits  LABORATORY DATA:  I have reviewed the data as listed Lab Results  Component Value Date   WBC 10.8 (H) 07/27/2022   HGB 12.4 07/27/2022   HCT 37.5 07/27/2022   MCV 101.1 (H) 07/27/2022   PLT 210 07/27/2022     Chemistry      Component Value Date/Time   NA 137 07/27/2022 0629   NA 141 04/08/2022 0000   K 4.2 07/27/2022 0629   CL 104 07/27/2022 0629   CO2 26 07/27/2022 0629   BUN 9 07/27/2022 0629   BUN 10 04/08/2022 0000   CREATININE 0.67 07/27/2022 0629   GLU 93 04/08/2022 0000      Component Value Date/Time   CALCIUM 9.7 07/27/2022 0629   ALKPHOS 82 07/27/2022 0629   AST 68 (H) 07/27/2022 0629   ALT 59 (H) 07/27/2022 0629   BILITOT 0.7 07/27/2022 0629       RADIOGRAPHIC STUDIES: I have personally reviewed the radiological images as listed and agreed with the findings in the report. DG Chest 2 View  Result Date: 08/09/2022 CLINICAL DATA:  Follow-up right pneumothorax. Right lung non-small-cell carcinoma. EXAM: CHEST - 2 VIEW COMPARISON:  07/29/2022 from [facility] FINDINGS: The heart size and mediastinal contours are within normal limits. Previously seen small right  pneumothorax has resolved. Both lungs are clear. No evidence of pleural effusion. IMPRESSION: Resolution of right pneumothorax. No active cardiopulmonary disease. Electronically Signed   By: Marlaine Hind M.D.   On: 08/09/2022 13:48    ASSESSMENT:  1.  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 - PET (06/02/2022): Right middle lobe nodule 1.3 x 0.7 cm, SUV 4.8.  6 x 4 mm right upper lobe subpleural nodule with no activity.  1 cm short axis lower paratracheal node, SUV 2.4. - Right Middle lobectomy, lymph node biopsy, right upper lobe wedge resection on 07/25/2022 - Pathology: 1.5 x 1.1 x 1 cm squamous cell carcinoma, margins negative, negative visceral pleural involvement, lymph node stations 4R, 7, 9, 11, 12 negative for metastatic disease.  IHC positive for CK5/6, p63 and negative for TTF-1.  Ki-67 shows increased proliferation.  2.  Social/family history: - She lives at home with her husband.  Quit smoking on 07/24/2022.  She uses oxygen 2 L at bedtime.  Smoked 1 pack/day for 50 years.  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 - Father died of metastatic bladder cancer.  Mother had breast cancer.  2 paternal uncles had brain cancer and prostate cancer.   PLAN:  1.  Stage I (T1b N0 M0) squamous cell carcinoma of the right middle lobe of the lung: -  We have reviewed images and pathology report in detail. - No indication for adjuvant chemotherapy. - I have recommended surveillance with CT chest with contrast every 6 months.  We will schedule her next scan in 3 months.  2.  Peripheral neuropathy (feet worse than hands): - She also has sharp pains at the surgical incision sites on the right lateral chest wall and posterior chest.  She has a pins-and-needles sensation in the hands and feet.  She has diabetes of 7 years duration. - Recommend starting  gabapentin 300 mg at bedtime.  If she tolerates well, she will take twice daily.   Orders Placed This Encounter  Procedures   CT Chest W Contrast    Standing Status:   Future    Standing Expiration Date:   08/29/2023    Order Specific Question:   If indicated for the ordered procedure, I authorize the administration of contrast media per Radiology protocol    Answer:   Yes    Order Specific Question:   Preferred imaging location?    Answer:   [facility]    Order Specific Question:   Release to patient    Answer:   Immediate   CBC with Differential    Standing Status:   Future    Standing Expiration Date:   08/29/2023   Comprehensive metabolic panel    Standing Status:   Future    Standing Expiration Date:   08/29/2023         Derek Jack, MD 08/29/2022 5:54 PM

## 2022-09-12 ENCOUNTER — Telehealth: Payer: Self-pay | Admitting: *Deleted

## 2022-09-12 NOTE — Telephone Encounter (Signed)
[employer]: This approval authorizes your coverage from 12/05/2021 - 12/04/2022. Of note it is not on her current med list.

## 2022-09-12 NOTE — Telephone Encounter (Signed)
[employer] PA, Key: Florentina [id number]. Sent to Medicare part D.

## 2022-09-15 ENCOUNTER — Other Ambulatory Visit: Payer: Self-pay

## 2022-09-15 MED ORDER — PREGABALIN 75 MG PO CAPS: 75.0000 mg | ORAL_CAPSULE | Freq: Two times a day (BID) | ORAL | 1 refills | Status: DC

## 2022-10-10 ENCOUNTER — Other Ambulatory Visit: Payer: Self-pay | Admitting: Thoracic Surgery (Cardiothoracic Vascular Surgery)

## 2022-10-10 ENCOUNTER — Telehealth: Payer: Self-pay | Admitting: Psychiatry

## 2022-10-10 DIAGNOSIS — C3490 Malignant neoplasm of unspecified part of unspecified bronchus or lung: Secondary | ICD-10-CM

## 2022-10-10 NOTE — Telephone Encounter (Signed)
LVM and sent mychart msg informing pt of need to reschedule 12/29/22 appointment - MD out

## 2022-10-11 ENCOUNTER — Encounter: Payer: Self-pay | Admitting: Internal Medicine

## 2022-10-11 ENCOUNTER — Ambulatory Visit (INDEPENDENT_AMBULATORY_CARE_PROVIDER_SITE_OTHER): Payer: Self-pay | Admitting: Thoracic Surgery (Cardiothoracic Vascular Surgery)

## 2022-10-11 ENCOUNTER — Ambulatory Visit
Admission: RE | Admit: 2022-10-11 | Discharge: 2022-10-11 | Disposition: A | Discharge status: Home / Self Care | Payer: Medicare HMO | Source: Ambulatory Visit | Attending: Thoracic Surgery (Cardiothoracic Vascular Surgery) | Admitting: Thoracic Surgery (Cardiothoracic Vascular Surgery)

## 2022-10-11 ENCOUNTER — Encounter: Payer: Self-pay | Admitting: Thoracic Surgery (Cardiothoracic Vascular Surgery)

## 2022-10-11 VITALS — BP 128/70 | HR 71 | Resp 20 | Ht 64.0 in | Wt 155.0 lb

## 2022-10-11 DIAGNOSIS — Z9889 Other specified postprocedural states: Secondary | ICD-10-CM

## 2022-10-11 DIAGNOSIS — C3491 Malignant neoplasm of unspecified part of right bronchus or lung: Secondary | ICD-10-CM

## 2022-10-11 DIAGNOSIS — C3490 Malignant neoplasm of unspecified part of unspecified bronchus or lung: Secondary | ICD-10-CM

## 2022-10-11 MED ORDER — PREGABALIN 75 MG PO CAPS: 75.0000 mg | ORAL_CAPSULE | Freq: Three times a day (TID) | ORAL | 2 refills | Status: DC

## 2022-10-11 NOTE — Progress Notes (Signed)
[address]       [place] [address]             [phone]     HPI: Vicki Blankenship returns for a scheduled follow-up visit  Vicki Blankenship is a 66 year old woman with history of tobacco abuse, COPD, type 2 diabetes, diabetic neuropathy, hypertension, bipolar disorder, meningioma, head injury, PTSD, seizure history, arthritis, osteopenia, reflux, and depression.  She was found to have a middle lobe nodule on a low-dose CT for lung cancer screening.  On PET the nodule is hypermetabolic.  I did a right middle lobectomy and wedge resection of an upper lobe nodule on 07/25/2022.  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  The upper lobe nodule is benign.  I saw her in the office on 08/09/2022.  She was having some discomfort and some numbness and pain in her right upper quadrant.  Refilled her oxycodone prescription on 08/11/2022.  She saw Dr. Delton Coombes.  She was having more pain at that time and was started on gabapentin.  She did not tolerate that and is currently on Lyrica 75 mg twice daily.  She complains of numbness and unusual sensations along the right costal margin.  Also will have 4-5 episodes per day of a sharp stabbing pain in the anterior right chest.  Pain typically last 5 to 10 minutes and resolve spontaneously.  Often associated with position change.  Past Medical History:  Diagnosis Date   Anemia    Anxiety    Aortic atherosclerosis (HCC)    Arthritis    Bipolar disorder ([place])    Chronic bronchitis with COPD (chronic obstructive pulmonary disease)    COPD (chronic obstructive pulmonary disease) (HCC)    Depression    Diabetes (HCC)    Type II   Diabetes mellitus, type II (HCC)    Dyspnea    GERD (gastroesophageal reflux disease)    Head injury    Headache    migraines- 3 times a week   HLD (hyperlipidemia)    HTN (hypertension)    Lesion of lung 2023   suspect lung cancer per pt   LOC (loss of consciousness) (HCC)     Meningioma (HCC)    L frontal   Pneumonia    PTSD (post-traumatic stress disorder)    Seizure (HCC)    hx of     Current Outpatient Medications  Medication Sig Dispense Refill   albuterol (VENTOLIN HFA) 108 (90 Base) MCG/ACT inhaler Inhale 1-2 puffs into the lungs every 6 (six) hours as needed for wheezing or shortness of breath.     aspirin 81 MG chewable tablet Chew 81 mg by mouth daily.     atorvastatin (LIPITOR) 40 MG tablet Take 40 mg by mouth daily.     clonazePAM (KLONOPIN) 0.5 MG tablet Take 0.5 mg by mouth 3 (three) times daily.     DULoxetine (CYMBALTA) 30 MG capsule Take 30 mg by mouth daily at 12 noon.     DULoxetine (CYMBALTA) 60 MG capsule Take 60 mg by mouth in the morning.     Glycopyrrolate-Formoterol (BEVESPI AEROSPHERE) 9-4.8 MCG/ACT AERO Take 2 puffs first thing in am and then another 2 puffs about 12 hours later. 1 each 11   hydrochlorothiazide (HYDRODIURIL) 25 MG tablet Take 25 mg by mouth daily.     lithium carbonate 300 MG capsule Take 300 mg by mouth in the morning and at bedtime.  losartan (COZAAR) 25 MG tablet Take 25 mg by mouth daily.     metFORMIN (GLUCOPHAGE) 500 MG tablet Take 500 mg by mouth daily with breakfast.     Multiple Vitamin (MULTIVITAMIN ADULT PO) Take 1 tablet by mouth daily.     naloxone (NARCAN) 4 MG/0.1ML LIQD nasal spray kit Place 1 spray into the nose as needed (opioid overdose).     OXYGEN Inhale 2 L into the lungs continuous.     pantoprazole (PROTONIX) 40 MG tablet Take 1 tablet (40 mg total) by mouth daily before breakfast. 90 tablet 3   pregabalin (LYRICA) 75 MG capsule Take 1 capsule (75 mg total) by mouth 2 (two) times daily. 60 capsule 1   QUEtiapine (SEROQUEL) 400 MG tablet Take 400 mg by mouth at bedtime.     QUEtiapine (SEROQUEL) 50 MG tablet Take 50 mg by mouth every morning.     Rimegepant Sulfate (NURTEC) 75 MG TBDP Take 75 mg by mouth as needed. Max dose one pill in 24 hours 8 tablet 6   vitamin B-12 (CYANOCOBALAMIN)  1000 MCG tablet Take 1,000 mcg by mouth daily.     vitamin C (ASCORBIC ACID) 500 MG tablet Take 500 mg by mouth daily.     Vitamin D, Cholecalciferol, 25 MCG (1000 UT) TABS Take 1,000 Units by mouth daily.     gabapentin (NEURONTIN) 300 MG capsule Take 1 capsule (300 mg total) by mouth 2 (two) times daily. 60 capsule 3   No current facility-administered medications for this visit.    Physical Exam BP 128/70   Pulse 71   Resp 20   Ht _0  (1.626 m)   Wt 155 lb (70.3 kg)   BMI 26.63 kg/m  66 year old woman in no acute distress Alert and oriented x3 with no focal deficits Lungs clear with equal breath sounds bilaterally Cardiac regular rate and rhythm Incisions well-healed Some muscular laxity in right upper quadrant  Diagnostic Tests: I personally reviewed her chest x-ray.  Status post right middle lobectomy but no active disease.  Impression: Vicki Blankenship is a 66 year old woman with history of tobacco abuse, COPD, type 2 diabetes, diabetic neuropathy, hypertension, bipolar disorder, meningioma, head injury, PTSD, seizure history, arthritis, osteopenia, reflux, depression, and a stage Ia squamous cell carcinoma of the right middle lobe status post lobectomy.   Stage Ia squamous cell carcinoma-status post right middle lobectomy.  No indication for adjuvant therapy.  She has seen Dr. Delton Coombes.  He will do her follow-up scans.  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  Usually last from 5 to 10 minutes.  I reassured her that pains of this nature are not uncommon after thoracic surger they even when done robotically.  Given short duration he cannot really treat the pain when it happens.  We will try increasing her Lyrica to 3 times daily.  Plan: Increase Lyrica to 75 mg twice daily She has a follow-up appoint with Dr. Delton Coombes next month I will plan to see her back in 2 months to check on her progress.  No chest  x-ray at that time unless there is a new indication.  [name], MD [facility] [phone]

## 2022-10-26 ENCOUNTER — Ambulatory Visit: Payer: Medicare HMO | Admitting: "Endocrinology

## 2022-11-08 ENCOUNTER — Other Ambulatory Visit: Payer: Self-pay | Admitting: "Endocrinology

## 2022-11-08 DIAGNOSIS — M85851 Other specified disorders of bone density and structure, right thigh: Secondary | ICD-10-CM

## 2022-11-11 ENCOUNTER — Ambulatory Visit: Payer: Medicare HMO | Admitting: Internal Medicine

## 2022-11-21 ENCOUNTER — Ambulatory Visit: Payer: Medicare HMO | Admitting: "Endocrinology

## 2022-11-24 ENCOUNTER — Inpatient Hospital Stay: Payer: Medicare HMO | Attending: Hematology

## 2022-11-24 ENCOUNTER — Ambulatory Visit (HOSPITAL_COMMUNITY)
Admission: RE | Admit: 2022-11-24 | Discharge: 2022-11-24 | Disposition: A | Discharge status: Home / Self Care | Payer: Medicare HMO | Source: Ambulatory Visit | Attending: Hematology | Admitting: Hematology

## 2022-11-24 DIAGNOSIS — R059 Cough, unspecified: Secondary | ICD-10-CM | POA: Insufficient documentation

## 2022-11-24 DIAGNOSIS — Z85118 Personal history of other malignant neoplasm of bronchus and lung: Secondary | ICD-10-CM | POA: Insufficient documentation

## 2022-11-24 DIAGNOSIS — R0609 Other forms of dyspnea: Secondary | ICD-10-CM | POA: Diagnosis not present

## 2022-11-24 DIAGNOSIS — Z9981 Dependence on supplemental oxygen: Secondary | ICD-10-CM | POA: Insufficient documentation

## 2022-11-24 DIAGNOSIS — Z79899 Other long term (current) drug therapy: Secondary | ICD-10-CM | POA: Insufficient documentation

## 2022-11-24 DIAGNOSIS — Z87891 Personal history of nicotine dependence: Secondary | ICD-10-CM | POA: Insufficient documentation

## 2022-11-24 DIAGNOSIS — Z902 Acquired absence of lung [part of]: Secondary | ICD-10-CM | POA: Insufficient documentation

## 2022-11-24 DIAGNOSIS — C3491 Malignant neoplasm of unspecified part of right bronchus or lung: Secondary | ICD-10-CM | POA: Diagnosis present

## 2022-11-24 LAB — CBC WITH DIFFERENTIAL/PLATELET
Abs Immature Granulocytes: 0.02 10*3/uL (ref 0.00–0.07)
Basophils Absolute: 0 10*3/uL (ref 0.0–0.1)
Basophils Relative: 1 %
Eosinophils Absolute: 0.2 10*3/uL (ref 0.0–0.5)
Eosinophils Relative: 4 %
HCT: 44.5 % (ref 36.0–46.0)
Hemoglobin: 14.1 g/dL (ref 12.0–15.0)
Immature Granulocytes: 0 %
Lymphocytes Relative: 26 %
Lymphs Abs: 1.5 10*3/uL (ref 0.7–4.0)
MCH: 30.7 pg (ref 26.0–34.0)
MCHC: 31.7 g/dL (ref 30.0–36.0)
MCV: 96.7 fL (ref 80.0–100.0)
Monocytes Absolute: 0.6 10*3/uL (ref 0.1–1.0)
Monocytes Relative: 10 %
Neutro Abs: 3.6 10*3/uL (ref 1.7–7.7)
Neutrophils Relative %: 59 %
Platelets: 228 10*3/uL (ref 150–400)
RBC: 4.6 MIL/uL (ref 3.87–5.11)
RDW: 13.2 % (ref 11.5–15.5)
WBC: 6 10*3/uL (ref 4.0–10.5)
nRBC: 0 % (ref 0.0–0.2)

## 2022-11-24 LAB — COMPREHENSIVE METABOLIC PANEL
ALT: 32 U/L (ref 0–44)
AST: 35 U/L (ref 15–41)
Albumin: 3.7 g/dL (ref 3.5–5.0)
Alkaline Phosphatase: 90 U/L (ref 38–126)
Anion gap: 9 (ref 5–15)
BUN: 8 mg/dL (ref 8–23)
CO2: 29 mmol/L (ref 22–32)
Calcium: 10 mg/dL (ref 8.9–10.3)
Chloride: 101 mmol/L (ref 98–111)
Creatinine, Ser: 0.64 mg/dL (ref 0.44–1.00)
GFR, Estimated: >60 mL/min (ref >60)
Glucose, Bld: 84 mg/dL (ref 70–99)
Potassium: 3.4 mmol/L — ABNORMAL LOW (ref 3.5–5.1)
Sodium: 139 mmol/L (ref 135–145)
Total Bilirubin: 0.5 mg/dL (ref 0.3–1.2)
Total Protein: 7.2 g/dL (ref 6.5–8.1)

## 2022-11-24 LAB — POCT I-STAT CREATININE: Creatinine, Ser: 0.7 mg/dL (ref 0.44–1.00)

## 2022-11-24 MED ORDER — IOHEXOL 300 MG/ML  SOLN
75.0000 mL | Freq: Once | INTRAMUSCULAR | Status: AC | PRN
  Administered 2022-11-24: 75 mL via INTRAVENOUS

## 2022-12-01 ENCOUNTER — Inpatient Hospital Stay: Payer: Medicare HMO | Admitting: Hematology

## 2022-12-01 VITALS — BP 117/73 | HR 64 | Temp 97.9°F | Resp 18 | Ht 64.0 in | Wt 155.2 lb

## 2022-12-01 DIAGNOSIS — Z85118 Personal history of other malignant neoplasm of bronchus and lung: Secondary | ICD-10-CM | POA: Diagnosis not present

## 2022-12-01 DIAGNOSIS — C3491 Malignant neoplasm of unspecified part of right bronchus or lung: Secondary | ICD-10-CM | POA: Diagnosis not present

## 2022-12-01 MED ORDER — AMOXICILLIN-POT CLAVULANATE 875-125 MG PO TABS: 1.0000 | ORAL_TABLET | Freq: Two times a day (BID) | ORAL | 0 refills | Status: DC

## 2022-12-01 NOTE — Patient Instructions (Addendum)
[place] at [facility] Discharge Instructions   You were seen and examined today by Dr. Delton Coombes.  He reviewed the results of the CT scan. It is showing some possible enlarging lymph nodes (2) in the chest. This could be due to your recent infection.   We will plan to repeat a CT scan in 3 months. But when you see Dr. Roxan Hockey, if he feels you need a PET scan, go ahead and do the PET.   Return as scheduled.    Thank you for choosing [place] at [facility] to provide your oncology and hematology care.  To afford each patient quality time with our provider, please arrive at least 15 minutes before your scheduled appointment time.   If you have a lab appointment with the [facility] please come in thru the Main Entrance and check in at the main information desk.  You need to re-schedule your appointment should you arrive 10 or more minutes late.  We strive to give you quality time with our providers, and arriving late affects you and other patients whose appointments are after yours.  Also, if you no show three or more times for appointments you may be dismissed from the clinic at the providers discretion.     Again, thank you for choosing [facility].  Our hope is that these requests will decrease the amount of time that you wait before being seen by our physicians.       _____________________________________________________________  Should you have questions after your visit to [facility], please contact our office at [phone] and follow the prompts.  Our office hours are 8:00 a.m. and 4:30 p.m. Monday - Friday.  Please note that voicemails left after 4:00 p.m. may not be returned until the following business day.  We are closed weekends and major holidays.  You do have access to a nurse 24-7, just call the main number to the clinic [phone] and do not press any options, hold on the line and a nurse  will answer the phone.    For prescription refill requests, have your pharmacy contact our office and allow 72 hours.    Due to Covid, you will need to wear a mask upon entering the hospital. If you do not have a mask, a mask will be given to you at the Main Entrance upon arrival. For doctor visits, patients may have 1 support person age 15 or older with them. For treatment visits, patients can not have anyone with them due to social distancing guidelines and our immunocompromised population.

## 2022-12-01 NOTE — Progress Notes (Signed)
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  PRIOR THERAPY: Right middle lobectomy, lymph node biopsy, right upper lobe wedge resection on 07/25/2022  NGS Results: Not done  CURRENT THERAPY: Surveillance  BRIEF ONCOLOGIC HISTORY:  Oncology History  Squamous cell lung cancer, right ([place])  08/29/2022 Initial Diagnosis   Squamous cell lung cancer, right ([place])   08/29/2022 Cancer Staging   Staging form: Lung, AJCC 8th Edition - Clinical stage from 08/29/2022: Stage IA2 (cT1b, cN0, cM0) - Signed by Derek Jack, MD on 08/29/2022 Histopathologic type: Squamous cell carcinoma, keratinizing, NOS Stage prefix: Initial diagnosis     CANCER STAGING:  Cancer Staging  Squamous cell lung cancer, right ([place]) Staging form: Lung, AJCC 8th Edition - Clinical stage from 08/29/2022: Stage IA2 (cT1b, cN0, cM0) - Signed by Derek Jack, MD on 08/29/2022    INTERVAL HISTORY:  Ms. Vicki Blankenship 66 y.o. female seen for follow-up of right lung cancer.  She reports neuropathic pain on the right lateral chest wall.  This is stable.  Cough and dyspnea on exertion is also stable.    REVIEW OF SYSTEMS:  Review of Systems  Respiratory:  Positive for cough and shortness of breath.   Gastrointestinal:  Positive for nausea.  Neurological:  Positive for dizziness and headaches.  Psychiatric/Behavioral:  Positive for depression. The patient is nervous/anxious.   All other systems reviewed and are negative.    PAST MEDICAL/SURGICAL HISTORY:  Past Medical History:  Diagnosis Date   Anemia    Anxiety    Aortic atherosclerosis (HCC)    Arthritis    Bipolar disorder ([place])    Chronic bronchitis with COPD (chronic obstructive pulmonary disease)    COPD (chronic  obstructive pulmonary disease) (HCC)    Depression    Diabetes (HCC)    Type II   Diabetes mellitus, type II (HCC)    Dyspnea    GERD (gastroesophageal reflux disease)    Head injury    Headache    migraines- 3 times a week   HLD (hyperlipidemia)    HTN (hypertension)    Lesion of lung 2023   suspect lung cancer per pt   LOC (loss of consciousness) ([place])    Meningioma ([place])    L frontal   Pneumonia    PTSD (post-traumatic stress disorder)    Seizure ([place])    hx of   Past Surgical History:  Procedure Laterality Date   BIOPSY  05/23/2022   Procedure: BIOPSY;  Surgeon: Eloise Harman, DO;  [facility];  Service: Endoscopy;;   CARPAL TUNNEL RELEASE Right    CHOLECYSTECTOMY  2013   COLONOSCOPY  2015   In [place].  Per patient, she had colon polyps.   COLONOSCOPY WITH PROPOFOL N/A 05/23/2022   Procedure: COLONOSCOPY WITH PROPOFOL;  Surgeon: Eloise Harman, DO;  [facility];  Service: Endoscopy;  Laterality: N/A;  11:00am   ESOPHAGOGASTRODUODENOSCOPY (EGD) WITH PROPOFOL N/A 05/23/2022   Procedure: ESOPHAGOGASTRODUODENOSCOPY (EGD) WITH PROPOFOL;  Surgeon: Eloise Harman, DO;  [facility];  Service: Endoscopy;  Laterality: N/A;   FOOT SURGERY Left    INTERCOSTAL NERVE BLOCK Right 07/25/2022   Procedure: INTERCOSTAL NERVE BLOCK;  Surgeon: [name], MD;  Location: [facility];  Service:  Thoracic;  Laterality: Right;   LUMBAR LAMINECTOMY/ DECOMPRESSION WITH MET-RX Left 04/14/2020   Procedure: Left Lumbar Four-Five Minimally invasive lumbar decompression;  Surgeon: Judith Part, MD;  Location: [facility];  Service: Neurosurgery;  Laterality: Left;  Left Lumbar Four-Five Minimally invasive lumbar decompression   LYMPH NODE DISSECTION Right 07/25/2022   Procedure: LYMPH NODE DISSECTION;  Surgeon: [name], MD;  Location: [facility];  Service: Thoracic;  Laterality: Right;   POLYPECTOMY  05/23/2022   Procedure: POLYPECTOMY;   Surgeon: Eloise Harman, DO;  [facility];  Service: Endoscopy;;   thumb surgery Left    pin placed   TUBAL LIGATION     VIDEO BRONCHOSCOPY WITH ENDOBRONCHIAL NAVIGATION N/A 07/25/2022   Procedure: VIDEO BRONCHOSCOPY WITH ENDOBRONCHIAL NAVIGATION;  Surgeon: [name], MD;  Location: [facility];  Service: Thoracic;  Laterality: N/A;     SOCIAL HISTORY:  Social History   Socioeconomic History   Marital status: Married    Spouse name: Not on file   Number of children: 2   Years of education: Not on file   Highest education level: Not on file  Occupational History   Not on file  Tobacco Use   Smoking status: Former    Packs/day: 0.50    Years: 40.00    Total pack years: 20.00    Types: Cigarettes    Quit date: 07/15/2022    Years since quitting: 0.3   Smokeless tobacco: Never  Vaping Use   Vaping Use: Former  Substance and Sexual Activity   Alcohol use: Never   Drug use: Not Currently   Sexual activity: Yes  Other Topics Concern   Not on file  Social History Narrative   Lives with husband   Right handed   Caffeine: coffee 4 cups in AM, throughout day 2-3 sodas   Social Determinants of Health   Financial Resource Strain: Low Risk  (08/29/2022)   Overall Financial Resource Strain (CARDIA)    Difficulty of Paying Living Expenses: Not very hard  Food Insecurity: No Food Insecurity (08/29/2022)   Hunger Vital Sign    Worried About Running Out of Food in the Last Year: Never true    [place] in the Last Year: Never true  Transportation Needs: No Transportation Needs (08/29/2022)   PRAPARE - Hydrologist (Medical): No    Lack of Transportation (Non-Medical): No  Physical Activity: Insufficiently Active (08/29/2022)   Exercise Vital Sign    Days of Exercise per Week: 3 days    Minutes of Exercise per Session: 30 min  Stress: Stress Concern Present (08/29/2022)   [place]    Feeling of Stress : To some extent  Social Connections: Moderately Integrated (08/29/2022)   Social Connection and Isolation Panel [NHANES]    Frequency of Communication with Friends and Family: Three times a week    Frequency of Social Gatherings with Friends and Family: Three times a week    Attends Religious Services: Never    Active Member of Clubs or Organizations: Yes    Attends Archivist Meetings: 1 to 4 times per year    Marital Status: Married  Human resources officer Violence: Not At Risk (08/29/2022)   Humiliation, Afraid, Rape, and Kick questionnaire    Fear of Current or Ex-Partner: No    Emotionally Abused: No    Physically Abused: No    Sexually Abused: No  FAMILY HISTORY:  Family History  Problem Relation Age of Onset   Migraines Mother    Diabetes Mother    Dementia Mother    Cancer Father    Heart disease Father    Colon cancer Neg Hx     CURRENT MEDICATIONS:  Outpatient Encounter Medications as of 12/01/2022  Medication Sig Note   albuterol (VENTOLIN HFA) 108 (90 Base) MCG/ACT inhaler Inhale 1-2 puffs into the lungs every 6 (six) hours as needed for wheezing or shortness of breath.    amoxicillin-clavulanate (AUGMENTIN) 875-125 MG tablet Take 1 tablet by mouth 2 (two) times daily.    aspirin 81 MG chewable tablet Chew 81 mg by mouth daily.    atorvastatin (LIPITOR) 40 MG tablet Take 40 mg by mouth daily.    clonazePAM (KLONOPIN) 0.5 MG tablet Take 0.5 mg by mouth 3 (three) times daily.    DULoxetine (CYMBALTA) 30 MG capsule Take 30 mg by mouth daily at 12 noon.    DULoxetine (CYMBALTA) 60 MG capsule Take 60 mg by mouth in the morning.    Glycopyrrolate-Formoterol (BEVESPI AEROSPHERE) 9-4.8 MCG/ACT AERO Take 2 puffs first thing in am and then another 2 puffs about 12 hours later.    hydrochlorothiazide (HYDRODIURIL) 25 MG tablet Take 25 mg by mouth daily.    lithium carbonate 300 MG capsule Take 300 mg by mouth in the morning and  at bedtime.    losartan (COZAAR) 25 MG tablet Take 25 mg by mouth daily.    metFORMIN (GLUCOPHAGE) 500 MG tablet Take 500 mg by mouth daily with breakfast.    Multiple Vitamin (MULTIVITAMIN ADULT PO) Take 1 tablet by mouth daily.    naloxone (NARCAN) 4 MG/0.1ML LIQD nasal spray kit Place 1 spray into the nose as needed (opioid overdose). 05/11/2022: About to expire    OXYGEN Inhale 2 L into the lungs continuous.    pantoprazole (PROTONIX) 40 MG tablet Take 1 tablet (40 mg total) by mouth daily before breakfast.    pregabalin (LYRICA) 75 MG capsule Take 1 capsule (75 mg total) by mouth 3 (three) times daily.    QUEtiapine (SEROQUEL) 400 MG tablet Take 400 mg by mouth at bedtime.    QUEtiapine (SEROQUEL) 50 MG tablet Take 50 mg by mouth every morning.    vitamin B-12 (CYANOCOBALAMIN) 1000 MCG tablet Take 1,000 mcg by mouth daily.    vitamin C (ASCORBIC ACID) 500 MG tablet Take 500 mg by mouth daily.    Vitamin D, Cholecalciferol, 25 MCG (1000 UT) TABS Take 1,000 Units by mouth daily.    [DISCONTINUED] Rimegepant Sulfate (NURTEC) 75 MG TBDP Take 75 mg by mouth as needed. Max dose one pill in 24 hours 06/10/2022: Approved 12/05/2021 - 12/04/2022   No facility-administered encounter medications on file as of 12/01/2022.    ALLERGIES:  Allergies  Allergen Reactions   Penicillins Swelling    Tongue swelling   Wellbutrin [Bupropion]     shakes     PHYSICAL EXAM:  ECOG Performance status: 1  Vitals:   12/01/22 1203  BP: 117/73  Pulse: 64  Resp: 18  Temp: 97.9 F (36.6 C)  SpO2: 99%   Filed Weights   12/01/22 1203  Weight: 155 lb 3.2 oz (70.4 kg)   Physical Exam Vitals reviewed.  Constitutional:      Appearance: Normal appearance.  Cardiovascular:     Rate and Rhythm: Normal rate and regular rhythm.     Heart sounds: Normal heart sounds.  Pulmonary:  Effort: Pulmonary effort is normal.     Breath sounds: Normal breath sounds.  Neurological:     Mental Status: She is alert.   Psychiatric:        Mood and Affect: Mood normal.        Behavior: Behavior normal.      LABORATORY DATA:  I have reviewed the labs as listed.  CBC    Component Value Date/Time   WBC 6.0 11/24/2022 0830   RBC 4.60 11/24/2022 0830   HGB 14.1 11/24/2022 0830   HCT 44.5 11/24/2022 0830   PLT 228 11/24/2022 0830   MCV 96.7 11/24/2022 0830   MCH 30.7 11/24/2022 0830   MCHC 31.7 11/24/2022 0830   RDW 13.2 11/24/2022 0830   LYMPHSABS 1.5 11/24/2022 0830   MONOABS 0.6 11/24/2022 0830   EOSABS 0.2 11/24/2022 0830   BASOSABS 0.0 11/24/2022 0830      Latest Ref Rng & Units 11/24/2022    8:30 AM 11/24/2022    8:06 AM 07/27/2022    6:29 AM  CMP  Glucose 70 - 99 mg/dL 84   104   BUN 8 - 23 mg/dL 8   9   Creatinine 0.44 - 1.00 mg/dL 0.64  0.70  0.67   Sodium 135 - 145 mmol/L 139   137   Potassium 3.5 - 5.1 mmol/L 3.4   4.2   Chloride 98 - 111 mmol/L 101   104   CO2 22 - 32 mmol/L 29   26   Calcium 8.9 - 10.3 mg/dL 10.0   9.7   Total Protein 6.5 - 8.1 g/dL 7.2   6.1   Total Bilirubin 0.3 - 1.2 mg/dL 0.5   0.7   Alkaline Phos 38 - 126 U/L 90   82   AST 15 - 41 U/L 35   68   ALT 0 - 44 U/L 32   59     DIAGNOSTIC IMAGING:  I have independently reviewed the scans and discussed with the patient.  ASSESSMENT:  1.  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 - PET (06/02/2022): Right middle lobe nodule 1.3 x 0.7 cm, SUV 4.8.  6 x 4 mm right upper lobe subpleural nodule with no activity.  1 cm short axis lower paratracheal node, SUV 2.4. - Right Middle lobectomy, lymph node biopsy, right upper lobe wedge resection on 07/25/2022 - Pathology: 1.5 x 1.1 x 1 cm squamous cell carcinoma, margins negative, negative visceral pleural involvement, lymph node stations 4R, 7, 9, 11, 12 negative for metastatic disease.  IHC positive for CK5/6, p63 and negative for TTF-1.  Ki-67  shows increased proliferation.   2.  Social/family history: - She lives at home with her husband.  Quit smoking on 07/24/2022.  She uses oxygen 2 L at bedtime.  Smoked 1 pack/day for 50 years.  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 - Father died of metastatic bladder cancer.  Mother had breast cancer.  2 paternal uncles had brain cancer and prostate cancer.  PLAN:   1.  Stage I (T1b N0 M0) squamous cell carcinoma of the right middle lobe of the lung: - CT chest (11/24/2022): Interval enlargement of pretracheal lymph node measuring 2.6 x 1.3 cm, previously 2.0 x 0.9 cm.  High paratracheal superior mediastinal lymph node measures 1.3 x 0.8 cm, previously 0.7 x 0.6 cm.  This was compared to PET scan from 06/02/2022.  No lung lesions seen. - She reports that she had been having cold symptoms with cough and yellowish-brown expectoration for the last 3 to 4 weeks. - I have discussed the option of doing PET scan now versus repeating CT scan in 3 months. - As she is having symptoms of respiratory infection, we will give her Augmentin 875 twice daily for 10 days.  Will plan to repeat CT scan in 3 months.  I will also reach out to Dr. Roxan Hockey to update him the plan.   2.  Peripheral neuropathy (feet worse than hands): - She has a sharp pains at the surgical incision sites in the right lateral chest wall and posterior chest. - Gabapentin did not help.  Dr. Roxan Hockey switched her to Lyrica.  She is taking Lyrica 75 mg 3 times daily.     Orders placed this encounter:  Orders Placed This Encounter  Procedures   CT Chest W Contrast   CBC with Differential   Comprehensive metabolic panel      Derek Jack, MD [place] [phone]

## 2022-12-08 ENCOUNTER — Other Ambulatory Visit: Payer: Self-pay | Admitting: Thoracic Surgery (Cardiothoracic Vascular Surgery)

## 2022-12-09 ENCOUNTER — Other Ambulatory Visit: Payer: Self-pay | Admitting: Thoracic Surgery (Cardiothoracic Vascular Surgery)

## 2022-12-13 ENCOUNTER — Other Ambulatory Visit: Payer: Self-pay | Admitting: Thoracic Surgery (Cardiothoracic Vascular Surgery)

## 2022-12-13 ENCOUNTER — Ambulatory Visit: Payer: Medicare HMO | Admitting: Thoracic Surgery (Cardiothoracic Vascular Surgery)

## 2022-12-13 ENCOUNTER — Encounter: Payer: Self-pay | Admitting: Thoracic Surgery (Cardiothoracic Vascular Surgery)

## 2022-12-13 VITALS — BP 144/86 | HR 60 | Resp 20 | Ht 64.0 in | Wt 156.0 lb

## 2022-12-13 DIAGNOSIS — C3491 Malignant neoplasm of unspecified part of right bronchus or lung: Secondary | ICD-10-CM | POA: Diagnosis not present

## 2022-12-13 DIAGNOSIS — Z9889 Other specified postprocedural states: Secondary | ICD-10-CM

## 2022-12-13 MED ORDER — PREGABALIN 75 MG PO CAPS: 75.0000 mg | ORAL_CAPSULE | Freq: Three times a day (TID) | ORAL | 5 refills | Status: DC

## 2022-12-13 NOTE — Progress Notes (Signed)
[address]       [place] [address]             [phone]      HPI: Mrs. Vicki Blankenship returns for a scheduled follow-up visit  Vicki Blankenship is a 67 year old woman with a history of tobacco abuse, COPD, type 2 diabetes, diabetic neuropathy, hypertension, bipolar disorder, meningioma, head injury, PTSD, seizure history, arthritis, osteopenia, reflux, and depression.  She has a 50-pack-year history of smoking.  She quit around the time of her surgery in August 2023.  She was found to have a lung nodule on a low-dose screening CT.  Follow-up scan showed an enlarging right middle lobe nodule.  She underwent robotic assisted right middle lobectomy and wedge resection of the right upper lobe nodule on 07/25/2022.  The right upper lobe nodule showed adenomatous hyperplasia.  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  She saw Dr. Delton Coombes in December.  A CT showed some increase in size of a pretracheal lymph node.  She is very concerned about that.  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  Her current Lyrica dose is 75 mg 3 times daily.  Past Medical History:  Diagnosis Date   Anemia    Anxiety    Aortic atherosclerosis (HCC)    Arthritis    Bipolar disorder ([place])    Chronic bronchitis with COPD (chronic obstructive pulmonary disease)    COPD (chronic obstructive pulmonary disease) (HCC)    Depression    Diabetes (HCC)    Type II   Diabetes mellitus, type II (HCC)    Dyspnea    GERD (gastroesophageal reflux disease)    Head injury    Headache    migraines- 3 times a week   HLD (hyperlipidemia)    HTN (hypertension)    Lesion of lung 2023   suspect lung cancer per pt   LOC (loss of consciousness) (HCC)    Meningioma (HCC)    L frontal   Pneumonia    PTSD (post-traumatic stress disorder)    Seizure (HCC)    hx of    Current Outpatient Medications   Medication Sig Dispense Refill   albuterol (VENTOLIN HFA) 108 (90 Base) MCG/ACT inhaler Inhale 1-2 puffs into the lungs every 6 (six) hours as needed for wheezing or shortness of breath.     amoxicillin-clavulanate (AUGMENTIN) 875-125 MG tablet Take 1 tablet by mouth 2 (two) times daily. 20 tablet 0   aspirin 81 MG chewable tablet Chew 81 mg by mouth daily.     atorvastatin (LIPITOR) 40 MG tablet Take 40 mg by mouth daily.     clonazePAM (KLONOPIN) 0.5 MG tablet Take 0.5 mg by mouth 3 (three) times daily.     DULoxetine (CYMBALTA) 30 MG capsule Take 30 mg by mouth daily at 12 noon.     DULoxetine (CYMBALTA) 60 MG capsule Take 60 mg by mouth in the morning.     Glycopyrrolate-Formoterol (BEVESPI AEROSPHERE) 9-4.8 MCG/ACT AERO Take 2 puffs first thing in am and then another 2 puffs about 12 hours later. 1 each 11   hydrochlorothiazide (HYDRODIURIL) 25 MG tablet Take 25 mg by mouth daily.     lithium carbonate 300 MG capsule Take 300 mg by mouth in the morning and at bedtime.     losartan (COZAAR) 25 MG tablet Take 25 mg by mouth daily.  metFORMIN (GLUCOPHAGE) 500 MG tablet Take 500 mg by mouth daily with breakfast.     Multiple Vitamin (MULTIVITAMIN ADULT PO) Take 1 tablet by mouth daily.     naloxone (NARCAN) 4 MG/0.1ML LIQD nasal spray kit Place 1 spray into the nose as needed (opioid overdose).     OXYGEN Inhale 2 L into the lungs continuous.     pantoprazole (PROTONIX) 40 MG tablet Take 1 tablet (40 mg total) by mouth daily before breakfast. 90 tablet 3   QUEtiapine (SEROQUEL) 400 MG tablet Take 400 mg by mouth at bedtime.     QUEtiapine (SEROQUEL) 50 MG tablet Take 50 mg by mouth every morning.     vitamin B-12 (CYANOCOBALAMIN) 1000 MCG tablet Take 1,000 mcg by mouth daily.     vitamin C (ASCORBIC ACID) 500 MG tablet Take 500 mg by mouth daily.     Vitamin D, Cholecalciferol, 25 MCG (1000 UT) TABS Take 1,000 Units by mouth daily.     pregabalin (LYRICA) 75 MG capsule Take 1 capsule (75  mg total) by mouth 3 (three) times daily. 90 capsule 5   No current facility-administered medications for this visit.    Physical Exam BP (!) 144/86 (BP Location: Left Arm, Patient Position: Sitting, Cuff Size: Normal)   Pulse 60   Resp 20   Ht 5\' 4"  (1.626 m)   Wt 156 lb (70.8 kg)   SpO2 94% Comment: RA  BMI 26.17 kg/m  67 year old woman in no acute distress Alert and oriented x 3 with no focal deficits No cervical or supraclavicular adenopathy Lungs faint wheezes at the left base otherwise clear Cardiac regular rate and rhythm Incisions well-healed Muscular laxity right upper quadrant  Diagnostic Tests: CT CHEST WITH CONTRAST   TECHNIQUE: Multidetector CT imaging of the chest was performed during intravenous contrast administration.   RADIATION DOSE REDUCTION: This exam was performed according to the departmental dose-optimization program which includes automated exposure control, adjustment of the mA and/or kV according to patient size and/or use of iterative reconstruction technique.   CONTRAST:  21mL OMNIPAQUE IOHEXOL 300 MG/ML  SOLN   COMPARISON:  PET-CT, 06/02/2022   FINDINGS: Cardiovascular: Aortic atherosclerosis. Normal heart size. Left and right coronary artery calcifications. No pericardial effusion.   Mediastinum/Nodes: Interval enlargement of previously seen mediastinal lymph nodes, pretracheal node measuring 2.6 x 1.3 cm, previously 2.0 x 0.9 cm (series 2, image 63), high pretracheal superior mediastinal nodes measuring up to 1.3 x 0.8 cm, previously 0.7 x 0.6 cm (series 2, image 36). Thyroid gland, trachea, and esophagus demonstrate no significant findings.   Lungs/Pleura: Status post interval right middle lobectomy and wedge resection of the peripheral right upper lobe. Moderate centrilobular and paraseptal emphysema with diffuse bilateral bronchial wall thickening   No pleural effusion or pneumothorax.   Upper Abdomen: No acute abnormality.  Status post cholecystectomy. Partially imaged postoperative biliary ductal dilatation   Musculoskeletal: No chest wall abnormality. No acute osseous findings.   IMPRESSION: 1. Status post interval right middle lobectomy and wedge resection of the peripheral right upper lobe. 2. Interval enlargement of previously seen mediastinal lymph nodes, possibly reactive although suspicious for nodal metastatic disease. PET-CT may be helpful to assess for abnormal FDG avidity, or at minimum close attention on follow-up is warranted. 3. Emphysema and diffuse bilateral bronchial wall thickening. 4. Coronary artery disease.   Aortic Atherosclerosis (ICD10-I70.0) and Emphysema (ICD10-J43.9).     Electronically Signed   By: Delanna Ahmadi M.D.   On: 11/27/2022 22:26 I  personally reviewed the CT images.  There is increase in size of a right pretracheal node.  Impression: Vicki Blankenship is a 67 year old woman with a history of tobacco abuse, COPD, type 2 diabetes, diabetic neuropathy, hypertension, bipolar disorder, meningioma, head injury, PTSD, seizure history, arthritis, osteopenia, reflux, and depression.   Squamous cell carcinoma right middle lobe-stage Ia.  Did not require any adjuvant therapy.  Recent CT shows mediastinal adenopathy.  I suspect this is likely a combination of postoperative reaction and possibly even some Surgicel in that area because we were dissecting in that area during the procedure.  I recommended that we wait and get another CT in a few months.  However she is extremely anxious about this and wants to be more aggressive.  Will order a PET/CT to rule out any additional sites and guide our initial diagnostic workup.  After the PET/CT will plan to do bronchoscopy and endobronchial ultrasound.  Informed her of the general nature of the procedure.  She understands we would do this under general anesthesia on an outpatient basis.  Formed her of the indications, risks, benefits, and  alternatives.  She understands the risks include, but not limited to death, MI, DVT, PE, stroke, bleeding, pneumothorax, as well as possibility of other unforeseeable complications.  Tobacco abuse-quit smoking in August.  I congratulated her for that.  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  Increased in the frequency of her Lyrica did not give her additional tablets.  I rewrote her prescription for Lyrica 75 mg p.o. 3 times daily, 90 tablets with 5 refills.  Plan: PET/CT Schedule bronch/ EBUS if activity present on PET/CT   [name], MD [facility] [phone]

## 2022-12-19 NOTE — Progress Notes (Unsigned)
Vicki Blankenship, female    DOB: October 21, 1956    MRN: [id number]   Brief patient profile:  54  yowf Quit smoking 07/2022 from [place]  grew up in house with bronchitis/ear infections referred to pulmonary clinic in [place]  04/18/2022 by Dr Yong Channel.  Daily symptoms since 2011 placed on advair > thrush and since around 2012 just on albuterol and 02 hs since 2010    History of Present Illness  04/18/2022  Pulmonary/ 1st office eval/ Vicki Blankenship / [place] [facility]  Chief Complaint  Patient presents with   Consult    Hx of COPD diagnosed around 2010.   Dyspnea:  2 aisles [employer]/ chest tightness walking to mailbox x years 200 ft uphill  to mailbox  Cough: worse since pna dec 2022 esp first thing worse x sev hours x brownish slt bloody mucus and epistaxis since jan 2023  Sleep: on 02 2lpm / flat bed 2 pillows   SABA use: once daily at most but very poor hfa 0 2  2lpm hs but not with activity  Rec We will call to get your last pft from [place] if available  Plan A = Automatic = Always=    Symbicort  80 (for Breztri) Take 2 puffs first thing in am and then another 2 puffs about 12 hours later.   Work on inhaler technique:  Zpak should improve your cough  Plan B = Backup (to supplement plan A, not to replace it) Only use your albuterol inhaler as a rescue medication Make sure you check your oxygen saturation  AT  your highest level of activity (not after you stop)   to be sure it stays over 90%  Suggested e-cigs as an optional  "one way bridge"  Off all tobacco products    CT chest 04/18/22  LDSCT Lung-RADS 4B, suspicious. Additional imaging evaluation or consultation with Pulmonology or Thoracic Surgery recommended. Right middle and right upper lobe pulmonary nodules, suspicious for primary bronchogenic carcinoma or carcinomas. Consider PET of the right middle lobe nodule. The right upper lobe nodule is at the low end of PET resolution. 2. No thoracic adenopathy. 3. Age advanced coronary artery  atherosclerosis. Recommend assessment of coronary risk factors. 4. Aortic atherosclerosis (ICD10-I70.0) and emphysema (ICD10-J43.9).  PET 06/02/22  1. Hypermetabolic medial right middle lobe nodule, high suspicion for malignancy. 2. The 6 by 4 mm right upper lobe nodule is not discernibly Hypermetabolic  3. Borderline enlarged right lower paratracheal lymph node has a maximum SUV of 2.4, equal to the blood pool. This tends to favor benign process.   06/06/2022  f/u ov/[place] office/Vicki Blankenship re: GOLD 0 copd/ RML nodule  maint on nothing since had to stop symbicort 80  due to mouth irritation   Chief Complaint  Patient presents with   Follow-up    Wants to talk about pet scan done on 6/29  Dyspnea:  ok [employer] shopping / uses [facility] parking due to  back problems  Cough: mucus is still green in am assoc with "terrible sinuses"  Sleeping: 2lpm flat bed coughing disturbs  SABA use: not using  02: 2lpm hs  Covid status: vax 5 / never infected  Rec Doxycycline 100 mg twice daily x 10 daily  Plan A = Automatic = Always=    Bevespi 2 puffs 1st thing in am and 12 hours later Work on inhaler technique:    Plan B = Backup (to supplement plan A, not to replace it) Only use your albuterol inhaler  as a rescue medication  For cough > mucinex dm 1200 mg every hours as needed  Prednisone 10 mg take  4 each am x 2 days,   2 each am x 2 days,  1 each am x 2 days and stop     The key is to stop smoking completely before smoking completely stops you!    08/16/2022  f/u ov/[place] office/Vicki Blankenship re: GOLD 2 copd  maint on breztri 2bid   Chief Complaint  Patient presents with   Follow-up    She had her PFT on 07/05/22. She had her procedure a few weeks backs and she has improved with her breathing, she is staying in the 90's oxygen levels.   Dyspnea:  Not limited by breathing from desired activities  / less active since surgery  Cough: none  Sleeping: flat bed/ 2 pillows  SABA use: none  02: 2lpm hs  not daytime  Rec Plan A = Automatic = Always=    Breztri Take 2 puffs first thing in am and then another 2 puffs about 12 hours later.  Work on inhaler technique:     Plan B = Backup (to supplement plan A, not to replace it) Only use your albuterol inhaler as a rescue medication      12/20/2022  f/u ov/[place] office/Vicki Blankenship re: GOLD 2 copd  maint on ***  No chief complaint on file.   Dyspnea:  *** Cough: *** Sleeping: *** SABA use: *** 02: *** Covid status: *** Lung cancer screening: ***   No obvious day to day or daytime variability or assoc excess/ purulent sputum or mucus plugs or hemoptysis or cp or chest tightness, subjective wheeze or overt sinus or hb symptoms.   *** without nocturnal  or early am exacerbation  of respiratory  c/o's or need for noct saba. Also denies any obvious fluctuation of symptoms with weather or environmental changes or other aggravating or alleviating factors except as outlined above   No unusual exposure hx or h/o childhood pna/ asthma or knowledge of premature birth.  Current Allergies, Complete Past Medical History, Past Surgical History, Family History, and Social History were reviewed in [employer] record.  ROS  The following are not active complaints unless bolded Hoarseness, sore throat, dysphagia, dental problems, itching, sneezing,  nasal congestion or discharge of excess mucus or purulent secretions, ear ache,   fever, chills, sweats, unintended wt loss or wt gain, classically pleuritic or exertional cp,  orthopnea pnd or arm/hand swelling  or leg swelling, presyncope, palpitations, abdominal pain, anorexia, nausea, vomiting, diarrhea  or change in bowel habits or change in bladder habits, change in stools or change in urine, dysuria, hematuria,  rash, arthralgias, visual complaints, headache, numbness, weakness or ataxia or problems with walking or coordination,  change in mood or  memory.        No outpatient  medications have been marked as taking for the 12/20/22 encounter (Appointment) with Nyoka Cowden, MD.                                    Past Medical History:  Diagnosis Date   Anxiety    Arthritis    Bipolar disorder (HCC)    COPD (chronic obstructive pulmonary disease) (HCC)    Depression    Diabetes (HCC)    Type II   Diabetes mellitus, type II (HCC)    Dyspnea  GERD (gastroesophageal reflux disease)    Headache    migraines- 3 times a week   HLD (hyperlipidemia)    HTN (hypertension)    Pneumonia    PTSD (post-traumatic stress disorder)        Objective:     Wts  12/20/2022        ***  08/16/2022        138   06/06/22 143 lb 3.2 oz (65 kg)  05/19/22 149 lb 14.6 oz (68 kg)  04/22/22 150 lb (68 kg)     Vital signs reviewed  12/20/2022  - Note at rest 02 sats  ***% on ***   General appearance:    ***     Min bar***               Assessment

## 2022-12-20 ENCOUNTER — Ambulatory Visit: Payer: Medicare HMO | Admitting: "Endocrinology

## 2022-12-20 ENCOUNTER — Ambulatory Visit: Payer: Medicare HMO | Admitting: Internal Medicine

## 2022-12-20 ENCOUNTER — Encounter: Payer: Self-pay | Admitting: Internal Medicine

## 2022-12-20 VITALS — BP 134/84 | HR 72 | Temp 98.5°F | Ht 64.0 in | Wt 159.2 lb

## 2022-12-20 DIAGNOSIS — J9611 Chronic respiratory failure with hypoxia: Secondary | ICD-10-CM | POA: Diagnosis not present

## 2022-12-20 DIAGNOSIS — F17200 Nicotine dependence, unspecified, uncomplicated: Secondary | ICD-10-CM | POA: Diagnosis not present

## 2022-12-20 DIAGNOSIS — R918 Other nonspecific abnormal finding of lung field: Secondary | ICD-10-CM

## 2022-12-20 DIAGNOSIS — J449 Chronic obstructive pulmonary disease, unspecified: Secondary | ICD-10-CM

## 2022-12-20 DIAGNOSIS — J9612 Chronic respiratory failure with hypercapnia: Secondary | ICD-10-CM

## 2022-12-20 MED ORDER — DOXYCYCLINE HYCLATE 100 MG PO TABS: 100.0000 mg | ORAL_TABLET | Freq: Two times a day (BID) | ORAL | 0 refills | Status: DC

## 2022-12-20 MED ORDER — PREDNISONE 10 MG PO TABS: ORAL_TABLET | ORAL | 0 refills | Status: DC

## 2022-12-20 NOTE — Assessment & Plan Note (Signed)
Quit smoking 07/2022  - PFTs 06/04/15 no significant obstruction/ ERV 20% at wt 189 / nl dlco  - Holter 01/22/16 x 24 h  Nl but no symptoms reported during monitor - 04/18/2022  After extensive coaching inhaler device,  effectiveness =    50% > try symbicort 80 2bid due to thrush on advair previously plus approp saba > could not tolerate oral side effects of ics - 06/06/2022   > try Bevespi 2 bid and practice with empty Breztri container - PFT's  07/05/22  FEV1 1.86 (76 % ) ratio 0.65  p 7 % improvement from saba p BREO prior to study with DLCO  14.95 (75%)   and FV curve mild concavity     12/20/2022  After extensive coaching inhaler device,  effectiveness =    60% from a baseline of about 30% and try breztri 2bid   Apparently now  Group D (now reclassified as E) in terms of symptom/risk and laba/lama/ICS  therefore appropriate rx at this point >>>  try breztri and max mucinex/ flutter valve to rx AB component

## 2022-12-20 NOTE — Assessment & Plan Note (Addendum)
Counseled re importance of smoking cessation but did not meet time criteria for separate billing     Each maintenance medication was reviewed in detail including emphasizing most importantly the difference between maintenance and prns and under what circumstances the prns are to be triggered using an action plan format where appropriate.  Total time for H and P, chart review, counseling, reviewing hfa/02/ flutter  device(s) and generating customized AVS unique to this office visit / same day charting > 30 min for multiple  refractory respiratory  symptoms of uncertain etiology

## 2022-12-20 NOTE — Patient Instructions (Addendum)
Doxycycline 100 mg twice daily x 10 days  - take 30 min before eating with a large glass of water   Prednisone 10 mg take  4 each am x 2 days,   2 each am x 2 days,  1 each am x 2 days and stop   Change bevespi to [place] Take 2 puffs first thing in am and then another 2 puffs about 12 hours later.    - Only use your albuterol as a rescue medication to be used if you can't catch your breath by resting or doing a relaxed purse lip breathing pattern.  - The less you use it, the better it will work when you need it. - Ok to use up to 2 puffs  every 4 hours if you must but call for immediate appointment if use goes up over your usual need - Don't leave home without it !!  (think of it like the starter fluid  for your car)   Also  Ok to try albuterol 15 min before an activity (on alternating days)  that you know would usually make you short of breath and see if it makes any difference and if makes none then don't take albuterol after activity unless you can't catch your breath as this means it's the resting that helps, not the albuterol.  For cough > mucinex DM 1200 mg every 12 hours to help mucus get out and use flutter valve as much as you can  The key is to stop smoking completely before smoking completely stops you!  Please schedule a follow up office visit in 6 weeks, call sooner if needed with all medications /inhalers/ solutions in hand so we can verify exactly what you are taking. This includes all medications from all doctors and over the counters

## 2022-12-20 NOTE — Assessment & Plan Note (Addendum)
HC03   On 03/01/22 =  31 but as high as 35 in 2001  -  04/18/2022   Walked on RA  x  3  lap(s) =  approx 450  ft  @ moderate pace, stopped due to end of study with lowest 02 sats 91% cc sob @ 3rd lap  - 06/06/2022   Walked on RA   x  3  lap(s) =  approx 450  ft  @ fast pace, stopped due to end of walk with lowest 02 sats 90% with sob on 3rd lap   -08/16/2022   Walked on RA  x  3  lap(s) =  approx 450  ft  @ mod pace, stopped due to end of study s sob with lowest 02 sats 96%   Again advised: Make sure you check your oxygen saturation  AT  your highest level of activity (not after you stop)   to be sure it stays over 90% and adjust  02 flow upward to maintain this level if needed but remember to turn it back to previous settings when you stop (to conserve your supply).

## 2022-12-20 NOTE — Assessment & Plan Note (Signed)
CT chest 04/18/22  LDSCT Lung-RADS 4B, suspicious. Additional imaging evaluation or consultation with Pulmonology or Thoracic Surgery recommended. Right middle and right upper lobe pulmonary nodules, suspicious for primary bronchogenic carcinoma or carcinomas. Consider PET of the right middle lobe nodule. The right upper lobe nodule is at the low end of PET resolution. 2. No thoracic adenopathy. 3. Age advanced coronary artery atherosclerosis. Recommend assessment of coronary risk factors. 4. Aortic atherosclerosis (ICD10-I70.0) and emphysema (ICD10-J43.9).  PET 06/02/22  1. Hypermetabolic medial right middle lobe nodule, high suspicion for malignancy. 2. The 6 by 4 mm right upper lobe nodule is not discernibly Hypermetabolic  3. Borderline enlarged right lower paratracheal lymph node has a maximum SUV of 2.4, equal to the blood pool. This tends to favor benign process. >>> right upper lobe wedge resection and right middle lobectomy on 07/25/2022. 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

## 2022-12-21 ENCOUNTER — Encounter (HOSPITAL_COMMUNITY): Payer: Medicare HMO

## 2022-12-29 ENCOUNTER — Encounter (HOSPITAL_COMMUNITY)
Admission: RE | Admit: 2022-12-29 | Discharge: 2022-12-29 | Disposition: A | Discharge status: Home / Self Care | Payer: Medicare HMO | Source: Ambulatory Visit | Attending: Thoracic Surgery (Cardiothoracic Vascular Surgery) | Admitting: Thoracic Surgery (Cardiothoracic Vascular Surgery)

## 2022-12-29 ENCOUNTER — Ambulatory Visit: Payer: Medicare HMO | Admitting: Psychiatry

## 2022-12-29 DIAGNOSIS — C3491 Malignant neoplasm of unspecified part of right bronchus or lung: Secondary | ICD-10-CM

## 2022-12-29 DIAGNOSIS — Z9889 Other specified postprocedural states: Secondary | ICD-10-CM | POA: Diagnosis present

## 2022-12-29 MED ORDER — FLUDEOXYGLUCOSE F - 18 (FDG) INJECTION
7.9700 | Freq: Once | INTRAVENOUS | Status: AC | PRN
  Administered 2022-12-29: 7.97 via INTRAVENOUS

## 2023-02-01 NOTE — Progress Notes (Unsigned)
Vicki Blankenship, female    DOB: [birth year]    MRN: [id number]   Brief patient profile:  36 yowf  Active smoker  from [place]  grew up in house with smokers/ bronchitis/ear infections referred to pulmonary clinic in [place]  04/18/2022 by Dr Yong Channel.  Daily symptoms since 2011 placed on advair > thrush and since around 2012 just on albuterol and 02 hs since 2010    History of Present Illness  04/18/2022  Pulmonary/ 1st office eval/ Appolonia Ackert / [place] [facility]  Chief Complaint  Patient presents with   Consult    Hx of COPD diagnosed around 2010.   Dyspnea:  2 aisles [employer]/ chest tightness walking to mailbox x years 200 ft uphill  to mailbox  Cough: worse since pna dec 2022 esp first thing worse x sev hours x brownish slt bloody mucus and epistaxis since jan 2023  Sleep: on 02 2lpm / flat bed 2 pillows   SABA use: once daily at most but very poor hfa 0 2  2lpm hs but not with activity  Rec We will call to get your last pft from [place] if available  Plan A = Automatic = Always=    Symbicort  80 (for Breztri) Take 2 puffs first thing in am and then another 2 puffs about 12 hours later.   Work on inhaler technique:  Zpak should improve your cough  Plan B = Backup (to supplement plan A, not to replace it) Only use your albuterol inhaler as a rescue medication Make sure you check your oxygen saturation  AT  your highest level of activity (not after you stop)   to be sure it stays over 90%  Suggested e-cigs as an optional  "one way bridge"  Off all tobacco products    CT chest 04/18/22  LDSCT Lung-RADS 4B, suspicious. Additional imaging evaluation or consultation with Pulmonology or Thoracic Surgery recommended. Right middle and right upper lobe pulmonary nodules, suspicious for primary bronchogenic carcinoma or carcinomas. Consider PET of the right middle lobe nodule. The right upper lobe nodule is at the low end of PET resolution. 2. No thoracic adenopathy. 3. Age advanced coronary  artery atherosclerosis. Recommend assessment of coronary risk factors. 4. Aortic atherosclerosis (ICD10-I70.0) and emphysema (ICD10-J43.9).  PET 06/02/22  1. Hypermetabolic medial right middle lobe nodule, high suspicion for malignancy. 2. The 6 by 4 mm right upper lobe nodule is not discernibly Hypermetabolic  3. Borderline enlarged right lower paratracheal lymph node has a maximum SUV of 2.4, equal to the blood pool. This tends to favor benign process.   06/06/2022  f/u ov/[place] office/Kirstie Larsen re: GOLD 0 copd/ RML nodule  maint on nothing since had to stop symbicort 80  due to mouth irritation   Chief Complaint  Patient presents with   Follow-up    Wants to talk about pet scan done on 6/29  Dyspnea:  ok [employer] shopping / uses [facility] parking due to  back problems  Cough: mucus is still green in am assoc with "terrible sinuses"  Sleeping: 2lpm flat bed coughing disturbs  SABA use: not using  02: 2lpm hs  Covid status: vax 5 / never infected  Rec Doxycycline 100 mg twice daily x 10 daily  Plan A = Automatic = Always=    Bevespi 2 puffs 1st thing in am and 12 hours later Work on inhaler technique:    Plan B = Backup (to supplement plan A, not to replace it) Only use your albuterol  inhaler as a rescue medication  For cough > mucinex dm 1200 mg every hours as needed  Prednisone 10 mg take  4 each am x 2 days,   2 each am x 2 days,  1 each am x 2 days and stop   S/p robotic assisted right middle lobectomy and wedge resection of the right upper lobe nodule on 07/25/2022.  The right upper lobe nodule showed adenomatous hyperplasia.  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   12/20/2022  f/u ov/[place] office/Sabrea Sankey re: GOLD 2 copd maint on [place] Complaint  Patient presents with   Follow-up    Has phlegm in throat having a hard time getting up  Dyspnea:  mailbox and back better tol but  going  slower Cough: greenish mucus / already amoxicillin > no better  Sleeping: as soon as lie down and 1st thing in am   SABA use: not using  02: 2lpm hs x " long time" / not checking sats Covid status: vax max  Mid line cp since x weeks not pleuritic, no change with different  positions/ eating coughing  Rec Doxycycline 100 mg twice daily x 10 days  - take 30 min before eating with a large glass of water  Prednisone 10 mg take  4 each am x 2 days,   2 each am x 2 days,  1 each am x 2 days and stop  Change bevespi to [place] Take 2 puffs first thing in am and then another 2 puffs about 12 hours later.   Only use your albuterol as a rescue medication to be used if you can't catch your breath  Also  Ok to try albuterol 15 min before an activity (on alternating days)  that you know would usually make you short of breath  For cough > mucinex DM 1200 mg every 12 hours to help mucus get out and use flutter valve as much as you can The key is to stop smoking completely before smoking completely stops you! Please schedule a follow up office visit in 6 weeks, call sooner if needed with all medications /inhalers/ solutions in hand     02/02/2023  f/u ov/[place] office/Linnet Bottari re: GOLD 2 copd/ MPNs/02 dep hs and prn  maint on bevespi Chief Complaint  Patient presents with   Follow-up    Had pet scan done and wants to talk about plan and tightness in throat and chest  Dyspnea:  walking a puppy named  Carly - walking fast past tol ok, has occ cp but no more walking than sitting with radiation to neck and R ear Cough: smoker's cough worse in am's >slt  yellow color/ same color from nose  Sleeping: bed is flat / 2 pillows  SABA use: not much  02: 2lpm hs / when cleaning also  Covid status: vax max  New midline cp x sev months/ lasts up to 30 min no more likely with activity or sitting still -*     No obvious day to day or daytime variability or assoc  mucus plugs or hemoptysis or   chest  tightness, subjective wheeze or overt sinus or hb symptoms.   Sleeping  without nocturnal  or early am exacerbation  of respiratory  c/o's or need for noct saba. Also denies any obvious fluctuation of symptoms with weather or environmental changes or other aggravating or alleviating factors except as outlined  above   No unusual exposure hx or h/o childhood pna/ asthma or knowledge of premature birth.  Current Allergies, Complete Past Medical History, Past Surgical History, Family History, and Social History were reviewed in [employer] record.  ROS  The following are not active complaints unless bolded Hoarseness, sore throat, dysphagia, dental problems, itching, sneezing,  nasal congestion or discharge of excess mucus or purulent secretions, ear ache,   fever, chills, sweats, unintended wt loss or wt gain, classically pleuritic or exertional cp,  orthopnea pnd or arm/hand swelling  or leg swelling, presyncope, palpitations, abdominal pain, anorexia, nausea, vomiting, diarrhea  or change in bowel habits or change in bladder habits, change in stools or change in urine, dysuria, hematuria,  rash, arthralgias, visual complaints, headache, numbness, weakness or ataxia or problems with walking or coordination,  change in mood or  memory.        Current Meds  Medication Sig   albuterol (VENTOLIN HFA) 108 (90 Base) MCG/ACT inhaler Inhale 1-2 puffs into the lungs every 6 (six) hours as needed for wheezing or shortness of breath.   aspirin 81 MG chewable tablet Chew 81 mg by mouth daily.   atorvastatin (LIPITOR) 40 MG tablet Take 40 mg by mouth daily.   clonazePAM (KLONOPIN) 0.5 MG tablet Take 0.5 mg by mouth 3 (three) times daily.   doxycycline (VIBRA-TABS) 100 MG tablet Take 1 tablet (100 mg total) by mouth 2 (two) times daily.   DULoxetine (CYMBALTA) 30 MG capsule Take 30 mg by mouth daily at 12 noon.   DULoxetine (CYMBALTA) 60 MG capsule Take 60 mg by mouth in the morning.    Glycopyrrolate-Formoterol (BEVESPI AEROSPHERE) 9-4.8 MCG/ACT AERO Take 2 puffs first thing in am and then another 2 puffs about 12 hours later.   hydrochlorothiazide (HYDRODIURIL) 25 MG tablet Take 25 mg by mouth daily.   lithium carbonate 300 MG capsule Take 300 mg by mouth in the morning and at bedtime.   losartan (COZAAR) 25 MG tablet Take 25 mg by mouth daily.   metFORMIN (GLUCOPHAGE) 500 MG tablet Take 500 mg by mouth daily with breakfast.   Multiple Vitamin (MULTIVITAMIN ADULT PO) Take 1 tablet by mouth daily.   naloxone (NARCAN) 4 MG/0.1ML LIQD nasal spray kit Place 1 spray into the nose as needed (opioid overdose).   OXYGEN Inhale 2 L into the lungs continuous.   pantoprazole (PROTONIX) 40 MG tablet Take 1 tablet (40 mg total) by mouth daily before breakfast.   predniSONE (DELTASONE) 10 MG tablet Take  4 each am x 2 days,   2 each am x 2 days,  1 each am x 2 days and stop   pregabalin (LYRICA) 75 MG capsule Take 1 capsule (75 mg total) by mouth 3 (three) times daily.   QUEtiapine (SEROQUEL) 400 MG tablet Take 400 mg by mouth at bedtime.   QUEtiapine (SEROQUEL) 50 MG tablet Take 50 mg by mouth every morning.   vitamin B-12 (CYANOCOBALAMIN) 1000 MCG tablet Take 1,000 mcg by mouth daily.   vitamin C (ASCORBIC ACID) 500 MG tablet Take 500 mg by mouth daily.   Vitamin D, Cholecalciferol, 25 MCG (1000 UT) TABS Take 1,000 Units by mouth daily.                                       Past Medical History:  Diagnosis Date   Anxiety  Arthritis    Bipolar disorder (HCC)    COPD (chronic obstructive pulmonary disease) (HCC)    Depression    Diabetes (HCC)    Type II   Diabetes mellitus, type II (HCC)    Dyspnea    GERD (gastroesophageal reflux disease)    Headache    migraines- 3 times a week   HLD (hyperlipidemia)    HTN (hypertension)    Pneumonia    PTSD (post-traumatic stress disorder)        Objective:     Wts  02/02/2023       163  12/20/2022       159   08/16/2022       138   06/06/22 143 lb 3.2 oz (65 kg)  05/19/22 149 lb 14.6 oz (68 kg)  04/22/22 150 lb (68 kg)    Vital signs reviewed  02/02/2023  - Note at rest 02 sats  95% on RA   General appearance:    amb wf nad     HEENT : Oropharynx  clear     NECK :  without  apparent JVD/ palpable Nodes/TM    LUNGS: no acc muscle use,  Min barrel  contour chest wall with bilateral  slightly decreased bs s audible wheeze and  without cough on insp or exp maneuvers and min  Hyperresonant  to  percussion bilaterally    CV:  RRR  no s3 or murmur or increase in P2, and no edema   ABD:  soft and nontender with pos end  insp Hoover's  in the supine position.  No bruits or organomegaly appreciated   MS:  Nl gait/ ext warm without deformities Or obvious joint restrictions  calf tenderness, cyanosis or clubbing     SKIN: warm and dry without lesions    NEURO:  alert, approp, nl sensorium with  no motor or cerebellar deficits apparent.                 Assessment

## 2023-02-02 ENCOUNTER — Encounter: Payer: Self-pay | Admitting: Radiology

## 2023-02-02 ENCOUNTER — Ambulatory Visit: Payer: Medicare HMO | Admitting: Internal Medicine

## 2023-02-02 ENCOUNTER — Encounter: Payer: Self-pay | Admitting: Internal Medicine

## 2023-02-02 VITALS — BP 132/84 | HR 90 | Ht 64.0 in | Wt 163.8 lb

## 2023-02-02 DIAGNOSIS — J9611 Chronic respiratory failure with hypoxia: Secondary | ICD-10-CM

## 2023-02-02 DIAGNOSIS — J449 Chronic obstructive pulmonary disease, unspecified: Secondary | ICD-10-CM | POA: Diagnosis not present

## 2023-02-02 DIAGNOSIS — J9612 Chronic respiratory failure with hypercapnia: Secondary | ICD-10-CM | POA: Diagnosis not present

## 2023-02-02 DIAGNOSIS — R079 Chest pain, unspecified: Secondary | ICD-10-CM | POA: Diagnosis not present

## 2023-02-02 MED ORDER — AZITHROMYCIN 250 MG PO TABS: ORAL_TABLET | ORAL | 0 refills | Status: DC

## 2023-02-02 MED ORDER — PREDNISONE 10 MG PO TABS: ORAL_TABLET | ORAL | 0 refills | Status: DC

## 2023-02-02 MED ORDER — FAMOTIDINE 20 MG PO TABS: ORAL_TABLET | ORAL | 11 refills | Status: DC

## 2023-02-02 NOTE — Assessment & Plan Note (Signed)
HC03   On 03/01/22 =  31 but as high as 35 in 2001  -  04/18/2022   Walked on RA  x  3  lap(s) =  approx 450  ft  @ moderate pace, stopped due to end of study with lowest 02 sats 91% cc sob @ 3rd lap  - 06/06/2022   Walked on RA   x  3  lap(s) =  approx 450  ft  @ fast pace, stopped due to end of walk with lowest 02 sats 90% with sob on 3rd lap   -08/16/2022   Walked on RA  x  3  lap(s) =  approx 450  ft  @ mod pace, stopped due to end of study s sob with lowest 02 sats 96%   Advised: Make sure you check your oxygen saturation  AT  your highest level of activity (not after you stop)   to be sure it stays over 90% and adjust  02 flow upward to maintain this level if needed but remember to turn it back to previous settings when you stop (to conserve your supply).          Each maintenance medication was reviewed in detail including emphasizing most importantly the difference between maintenance and prns and under what circumstances the prns are to be triggered using an action plan format where appropriate.  Total time for H and P, chart review, counseling, reviewing hfa/02  device(s) and generating customized AVS unique to this office visit / same day charting > 30 min for multiple  refractory respiratory  symptoms of uncertain etiology

## 2023-02-02 NOTE — Assessment & Plan Note (Signed)
Quit smoking 07/2022  - PFTs 06/04/15 no significant obstruction/ ERV 20% at wt 189 / nl dlco  - Holter 01/22/16 x 24 h  Nl but no symptoms reported during monitor - 04/18/2022  After extensive coaching inhaler device,  effectiveness =    50% > try symbicort 80 2bid due to thrush on advair previously plus approp saba > could not tolerate oral side effects of ics - 06/06/2022   > try Bevespi 2 bid and practice with empty Breztri container - PFT's  07/05/22  FEV1 1.86 (76 % ) ratio 0.65  p 7 % improvement from saba p BREO prior to study with DLCO  14.95 (75%)   and FV curve mild concavity     12/20/2022  After extensive coaching inhaler device,  effectiveness =    60% from a baseline of about 30% and try bevespi   Mild flare assoc with atypical cp   Rec Zpak Prednisone 10 mg take  4 each am x 2 days,   2 each am x 2 days,  1 each am x 2 days and stop  Max mucinex dm and flutter valve Max gerd rx  Cards eval non urgent as not reproduced with exertion   F/u 4 weeks sooner prn

## 2023-02-02 NOTE — Patient Instructions (Addendum)
Zpak   Mucinex dm 1200 mg twice daily as needed for cough and use flutter valve as much as you can  Make sure you check your oxygen saturation  AT  your highest level of activity (not after you stop)   to be sure it stays over 90% and adjust  02 flow upward to maintain this level if needed but remember to turn it back to previous settings when you stop (to conserve your supply).   Prednisone 10 mg take  4 each am x 2 days,   2 each am x 2 days,  1 each am x 2 days and stop  Pantoprazole (protonix) 40 mg   Take  30-60 min before first meal of the day and add Pepcid (famotidine)  20 mg after supper until return to office - this is the best way to tell whether stomach acid is contributing to your problem.      GERD (REFLUX)  is an extremely common cause of respiratory symptoms just like yours , many times with no obvious heartburn at all.    It can be treated with medication, but also with lifestyle changes including elevation of the head of your bed (ideally with 6 -8inch blocks under the headboard of your bed),  Smoking cessation, avoidance of late meals, excessive alcohol, and avoid fatty foods, chocolate, peppermint, colas, red wine, and acidic juices such as orange juice.  NO MINT OR MENTHOL PRODUCTS SO NO COUGH DROPS  USE SUGARLESS CANDY INSTEAD (Jolley ranchers or Stover's or Life Savers) or even ice chips will also do - the key is to swallow to prevent all throat clearing. NO OIL BASED VITAMINS - use powdered substitutes.  Avoid fish oil when coughing.   My office will be contacting you by phone for referral to cardiology clinic for your chest pain   - if you don't hear back from my office within one week please call us back or notify us thru MyChart and we'll address it right away.   Please schedule a follow up office visit in 4 weeks, sooner if needed

## 2023-02-23 ENCOUNTER — Other Ambulatory Visit (HOSPITAL_COMMUNITY): Payer: Medicare HMO

## 2023-02-23 ENCOUNTER — Other Ambulatory Visit: Payer: Medicare HMO

## 2023-03-02 ENCOUNTER — Ambulatory Visit: Payer: Medicare HMO | Admitting: Hematology

## 2023-03-03 ENCOUNTER — Ambulatory Visit: Payer: Medicare HMO | Admitting: Internal Medicine

## 2023-03-16 ENCOUNTER — Encounter: Payer: Self-pay | Admitting: Internal Medicine

## 2023-03-16 ENCOUNTER — Ambulatory Visit: Payer: Medicare HMO | Attending: Internal Medicine | Admitting: Internal Medicine

## 2023-03-16 VITALS — BP 124/78 | HR 68 | Ht 64.0 in | Wt 158.0 lb

## 2023-03-16 DIAGNOSIS — I10 Essential (primary) hypertension: Secondary | ICD-10-CM

## 2023-03-16 DIAGNOSIS — I739 Peripheral vascular disease, unspecified: Secondary | ICD-10-CM

## 2023-03-16 DIAGNOSIS — E7849 Other hyperlipidemia: Secondary | ICD-10-CM

## 2023-03-16 DIAGNOSIS — R079 Chest pain, unspecified: Secondary | ICD-10-CM

## 2023-03-16 DIAGNOSIS — R0781 Pleurodynia: Secondary | ICD-10-CM

## 2023-03-16 DIAGNOSIS — R0789 Other chest pain: Secondary | ICD-10-CM | POA: Insufficient documentation

## 2023-03-16 DIAGNOSIS — E785 Hyperlipidemia, unspecified: Secondary | ICD-10-CM | POA: Insufficient documentation

## 2023-03-16 MED ORDER — METOPROLOL TARTRATE 100 MG PO TABS: ORAL_TABLET | ORAL | 0 refills | Status: DC

## 2023-03-16 NOTE — Progress Notes (Signed)
Cardiology Office Note  Date: 03/16/2023   ID: Vicki Blankenship, DOB 14-Jan-1956, MRN [id number]  PCP:  Karl Bales, NP  Cardiologist:  Marjo Bicker, MD Electrophysiologist:  None   Reason for Office Visit: Evaluation chest pain at the request of Dr Sherene Sires, MD   History of Present Illness: Vicki Blankenship is a 67 y.o. female known to have HTN, DM 2, lung lobectomy gold 2 COPD, nicotine abuse was referred to cardiology clinic for evaluation of chest pain.  Patient reported having chest pain with coughing, chest pain with taking a deep breath and also similar quality of stabbing sharp quality of chest pains with rest and exertion. Occurs few times per week and last for a few minutes. No relation with rest or exercise. Ongoing for some time now.  She moved from [place] to [place], underwent stress test and LHC in [place] around 10 years ago. She was told she had some blockage in her heart vessels at that time but no stent was required.  She currently smokes cigarettes, has nicotine patch.  She does feel tired, has some SOB, the most minutes after she has done was vacuuming her house.  Denies other symptoms of dizziness, lightheadedness, syncope, leg swelling.  She also has soreness/pain in the buttocks with exertion.  She was seen by neurologist and underwent back surgery but did not relieve the buttock pain.  Past Medical History:  Diagnosis Date   Anemia    Anxiety    Aortic atherosclerosis    Arthritis    Bipolar disorder    Chronic bronchitis with COPD (chronic obstructive pulmonary disease)    COPD (chronic obstructive pulmonary disease)    Depression    Diabetes    Type II   Diabetes mellitus, type II    Dyspnea    GERD (gastroesophageal reflux disease)    Head injury    Headache    migraines- 3 times a week   HLD (hyperlipidemia)    HTN (hypertension)    Lesion of lung 2023   suspect lung cancer per pt   LOC (loss of consciousness)     Meningioma    L frontal   Pneumonia    PTSD (post-traumatic stress disorder)    Seizure    hx of    Past Surgical History:  Procedure Laterality Date   BIOPSY  05/23/2022   Procedure: BIOPSY;  Surgeon: Lanelle Bal, DO;  [facility];  Service: Endoscopy;;   CARPAL TUNNEL RELEASE Right    CHOLECYSTECTOMY  2013   COLONOSCOPY  2015   In [place].  Per patient, she had colon polyps.   COLONOSCOPY WITH PROPOFOL N/A 05/23/2022   Procedure: COLONOSCOPY WITH PROPOFOL;  Surgeon: Lanelle Bal, DO;  [facility];  Service: Endoscopy;  Laterality: N/A;  11:00am   ESOPHAGOGASTRODUODENOSCOPY (EGD) WITH PROPOFOL N/A 05/23/2022   Procedure: ESOPHAGOGASTRODUODENOSCOPY (EGD) WITH PROPOFOL;  Surgeon: Lanelle Bal, DO;  [facility];  Service: Endoscopy;  Laterality: N/A;   FOOT SURGERY Left    INTERCOSTAL NERVE BLOCK Right 07/25/2022   Procedure: INTERCOSTAL NERVE BLOCK;  Surgeon: Loreli Slot, MD;  Location: [facility] OR;  Service: Thoracic;  Laterality: Right;   LUMBAR LAMINECTOMY/ DECOMPRESSION WITH MET-RX Left 04/14/2020   Procedure: Left Lumbar Four-Five Minimally invasive lumbar decompression;  Surgeon: Jadene Pierini, MD;  Location: [facility] OR;  Service: Neurosurgery;  Laterality: Left;  Left Lumbar Four-Five Minimally invasive lumbar  decompression   LYMPH NODE DISSECTION Right 07/25/2022   Procedure: LYMPH NODE DISSECTION;  Surgeon: Loreli SlotHendrickson, Steven C, MD;  [facility];  Service: Thoracic;  Laterality: Right;   POLYPECTOMY  05/23/2022   Procedure: POLYPECTOMY;  Surgeon: Lanelle Balarver, Charles K, DO;  [facility];  Service: Endoscopy;;   thumb surgery Left    pin placed   TUBAL LIGATION     VIDEO BRONCHOSCOPY WITH ENDOBRONCHIAL NAVIGATION N/A 07/25/2022   Procedure: VIDEO BRONCHOSCOPY WITH ENDOBRONCHIAL NAVIGATION;  Surgeon: Loreli SlotHendrickson, Steven C, MD;  Location: [facility] OR;  Service: Thoracic;  Laterality: N/A;    Current Outpatient  Medications  Medication Sig Dispense Refill   albuterol (VENTOLIN HFA) 108 (90 Base) MCG/ACT inhaler Inhale 1-2 puffs into the lungs every 6 (six) hours as needed for wheezing or shortness of breath.     ARIPiprazole (ABILIFY) 5 MG tablet 1/2 tablet for 1 week, then 1 tablet daily Orally Once a day for 30 days     aspirin 81 MG chewable tablet Chew 81 mg by mouth daily.     atorvastatin (LIPITOR) 40 MG tablet Take 40 mg by mouth daily.     clonazePAM (KLONOPIN) 0.5 MG tablet Take 0.5 mg by mouth 3 (three) times daily.     DULoxetine (CYMBALTA) 30 MG capsule Take 30 mg by mouth daily at 12 noon.     DULoxetine (CYMBALTA) 60 MG capsule Take 60 mg by mouth in the morning.     famotidine (PEPCID) 20 MG tablet One after supper 30 tablet 11   Glycopyrrolate-Formoterol (BEVESPI AEROSPHERE) 9-4.8 MCG/ACT AERO Take 2 puffs first thing in am and then another 2 puffs about 12 hours later. 1 each 11   hydrochlorothiazide (HYDRODIURIL) 25 MG tablet Take 25 mg by mouth daily.     lithium carbonate 300 MG capsule Take 300 mg by mouth in the morning and at bedtime.     losartan (COZAAR) 25 MG tablet Take 25 mg by mouth daily.     metFORMIN (GLUCOPHAGE) 500 MG tablet Take 500 mg by mouth daily with breakfast.     Multiple Vitamin (MULTIVITAMIN ADULT PO) Take 1 tablet by mouth daily.     naloxone (NARCAN) 4 MG/0.1ML LIQD nasal spray kit Place 1 spray into the nose as needed (opioid overdose).     OXYGEN Inhale 2 L into the lungs continuous.     pantoprazole (PROTONIX) 40 MG tablet Take 1 tablet (40 mg total) by mouth daily before breakfast. 90 tablet 3   pregabalin (LYRICA) 75 MG capsule Take 1 capsule (75 mg total) by mouth 3 (three) times daily. 90 capsule 5   vitamin B-12 (CYANOCOBALAMIN) 1000 MCG tablet Take 1,000 mcg by mouth daily.     vitamin C (ASCORBIC ACID) 500 MG tablet Take 500 mg by mouth daily.     Vitamin D, Cholecalciferol, 25 MCG (1000 UT) TABS Take 1,000 Units by mouth daily.     QUEtiapine  (SEROQUEL) 400 MG tablet Take 400 mg by mouth at bedtime.     QUEtiapine (SEROQUEL) 50 MG tablet Take 50 mg by mouth every morning.     No current facility-administered medications for this visit.   Allergies:  Penicillins and Wellbutrin [bupropion]   Social History: The patient  reports that she has been smoking cigarettes. She has a 20.00 pack-year smoking history. She has never used smokeless tobacco. She reports that she does not currently use drugs. She reports that she does not drink alcohol.   Family History:  The patient's family history includes Cancer in her father; Dementia in her mother; Diabetes in her mother; Heart disease in her father; Migraines in her mother.   ROS:  Please see the history of present illness. Otherwise, complete review of systems is positive for none.  All other systems are reviewed and negative.   Physical Exam: VS:  BP 124/78   Pulse 68   Ht  (1.626 m)   Wt 158 lb (71.7 kg)   SpO2 93%   BMI 27.12 kg/m , BMI Body mass index is 27.12 kg/m.  Wt Readings from Last 3 Encounters:  03/16/23 158 lb (71.7 kg)  02/02/23 163 lb 12.8 oz (74.3 kg)  12/20/22 159 lb 3.2 oz (72.2 kg)    General: Patient appears comfortable at rest. HEENT: Conjunctiva and lids normal, oropharynx clear with moist mucosa. Neck: Supple, no elevated JVP or carotid bruits, no thyromegaly. Lungs: Clear to auscultation, nonlabored breathing at rest. Cardiac: Regular rate and rhythm, no S3 or significant systolic murmur, no pericardial rub. Abdomen: Soft, nontender, no hepatomegaly, bowel sounds present, no guarding or rebound. Extremities: No pitting edema, distal pulses 2+. Skin: Warm and dry. Musculoskeletal: No kyphosis. Neuropsychiatric: Alert and oriented x3, affect grossly appropriate.  ECG:  NSR  Recent Labwork: 04/08/2022: Magnesium 2.3 07/26/2022: TSH 0.460 11/24/2022: ALT 32; AST 35; BUN 8; Creatinine, Ser 0.64; Hemoglobin 14.1; Platelets 228; Potassium 3.4; Sodium  139     Component Value Date/Time   CHOL 141 03/01/2022 0000   TRIG 144 03/01/2022 0000   HDL 51 03/01/2022 0000   LDLCALC 67 03/01/2022 0000    Other Studies Reviewed Today:   Assessment and Plan: Patient is a 67 year old F known to have HTN, DM 2, COPD, lung lobectomy, nicotine abuse, was referred to cardiology clinic for evaluation chest pain.  # Chest pain likely musculoskeletal, pleuritic but probably has some component of ischemia: LHC on 10 years ago showed some blockage in her heart vessels that did not require any PCI. Will obtain CTA cardiac to rule out any severe CAD.  She will at least benefit from medical management if she has moderate to severe CAD. # HTN, controlled: Continue losartan 25 mg once daily # HLD: Continue atorvastatin 40 mg nightly, she was told in the past that she had high triglycerides.  Will obtain lipid panel from the PCP. # Claudication in the buttocks: Back surgery did not relieve the pain, obtain ABI with USG arterial Doppler lower extremities. # Nicotine abuse: Currently smoking cigarettes and also nicotine patches.  Educated patient that she should not do both at the same time. Smoking cessation instruction/counseling given:  counseled patient on the dangers of tobacco use, advised patient to stop smoking, and reviewed strategies to maximize success    I have spent a total of 45 minutes with patient reviewing chart, EKGs, labs and examining patient as well as establishing an assessment and plan that was discussed with the patient.  > 50% of time was spent in direct patient care.     Medication Adjustments/Labs and Tests Ordered: Current medicines are reviewed at length with the patient today.  Concerns regarding medicines are outlined above.   Tests Ordered:  Orders Placed This Encounter  Procedures   CT CORONARY MORPH W/CTA COR W/SCORE W/CA W/CM &/OR WO/CM   US ARTERIAL ABI (SCREENING LOWER EXTREMITY)   CBC   Basic metabolic panel   EKG  12-Lead    Medication Changes: No orders of the defined types were placed in  this encounter.   Disposition:  Follow up  6 months  Signed, Charli Halle Verne Spurr, MD, 03/16/2023 9:13 AM    [facility] at [facility] [address] [place], [place] [address]

## 2023-03-16 NOTE — Patient Instructions (Addendum)
Medication Instructions:  Your physician recommends that you continue on your current medications as directed. Please refer to the Current Medication list given to you today.  *If you need a refill on your cardiac medications before your next appointment, please call your pharmacy*   Lab Work: CBC BMET  If you have labs (blood work) drawn today and your tests are completely normal, you will receive your results only by: MyChart Message (if you have MyChart) OR A paper copy in the mail If you have any lab test that is abnormal or we need to change your treatment, we will call you to review the results.   Testing/Procedures: Your physician has requested that you have an ankle brachial index (ABI). During this test an ultrasound and blood pressure cuff are used to evaluate the arteries that supply the arms and legs with blood. Allow thirty minutes for this exam. There are no restrictions or special instructions.    Follow-Up: At [facility], you and your health needs are our priority.  As part of our continuing mission to provide you with exceptional heart care, we have created designated Provider Care Teams.  These Care Teams include your primary Cardiologist (physician) and Advanced Practice Providers (APPs -  Physician Assistants and Nurse Practitioners) who all work together to provide you with the care you need, when you need it.  We recommend signing up for the patient portal called "MyChart".  Sign up information is provided on this After Visit Summary.  MyChart is used to connect with patients for Virtual Visits (Telemedicine).  Patients are able to view lab/test results, encounter notes, upcoming appointments, etc.  Non-urgent messages can be sent to your provider as well.   To learn more about what you can do with MyChart, go to [email or web address].    Your next appointment:   6 month(s)  Provider:   Luane School, MD    Other Instructions    Your cardiac  CT will be scheduled at one of the below locations:   [facility] [address] [place], [place] [address] [phone]  OR  [facility] [address] [place], [place] [address] [phone]  OR   [facility] [address] [place], [place] [address] [phone]  If scheduled at [facility], please arrive at the [facility] and Children's Entrance (Entrance C2) of [facility] 30 minutes prior to test start time. You can use the FREE valet parking offered at entrance C (encouraged to control the heart rate for the test)  Proceed to the [facility] Radiology Department (first floor) to check-in and test prep.  All radiology patients and guests should use entrance C2 at [facility], accessed from [facility], even though the hospital's physical address listed is [address].    If scheduled at [facility] or [facility], please arrive 15 mins early for check-in and test prep.   Please follow these instructions carefully (unless otherwise directed):  On the Night Before the Test: Be sure to Drink plenty of water. Do not consume any caffeinated/decaffeinated beverages or chocolate 12 hours prior to your test. Do not take any antihistamines 12 hours prior to your test.  On the Day of the Test: Drink plenty of water until 1 hour prior to the test. Do not eat any food 1 hour prior to test. You may take your regular medications prior to the test.  Take metoprolol (Lopressor) two hours prior to test. If you take Furosemide/Hydrochlorothiazide/Spironolactone, please HOLD on the morning of the test. FEMALES- please wear underwire-free bra if available, avoid dresses & tight clothing      After the Test: Drink plenty of water. After receiving IV contrast, you may experience a mild flushed feeling. This is  normal. On occasion, you may experience a mild rash up to 24 hours after the test. This is not dangerous. If this occurs, you can take Benadryl 25 mg and increase your fluid intake. If you experience trouble breathing, this can be serious. If it is severe call 911 IMMEDIATELY. If it is mild, please call our office. If you take any of these medications: Glipizide/Metformin, Avandament, Glucavance, please do not take 48 hours after completing test unless otherwise instructed.  We will call to schedule your test 2-4 weeks out understanding that some insurance companies will need an authorization prior to the service being performed.   For non-scheduling related questions, please contact the cardiac imaging nurse navigator should you have any questions/concerns: Rockwell Alexandria, Cardiac Imaging Nurse Navigator Larey Brick, Cardiac Imaging Nurse Navigator [facility] Direct Office Dial: [phone]   For scheduling needs, including cancellations and rescheduling, please call [place], [phone].

## 2023-03-20 ENCOUNTER — Other Ambulatory Visit: Payer: Self-pay | Admitting: *Deleted

## 2023-03-20 ENCOUNTER — Telehealth (HOSPITAL_COMMUNITY): Payer: Self-pay | Admitting: Emergency Medicine

## 2023-03-20 MED ORDER — BEVESPI AEROSPHERE 9-4.8 MCG/ACT IN AERO: INHALATION_SPRAY | RESPIRATORY_TRACT | 11 refills | Status: DC

## 2023-03-20 MED ORDER — ALBUTEROL SULFATE HFA 108 (90 BASE) MCG/ACT IN AERS: 1.0000 | INHALATION_SPRAY | Freq: Four times a day (QID) | RESPIRATORY_TRACT | 3 refills | Status: AC | PRN

## 2023-03-20 NOTE — Telephone Encounter (Signed)
Reaching out to patient to offer assistance regarding upcoming cardiac imaging study; pt verbalizes understanding of appt date/time, parking situation and where to check in, pre-test NPO status and medications ordered, and verified current allergies; name and call back number provided for further questions should they arise Rockwell Alexandria RN Navigator Cardiac Imaging [facility] [phone] office [phone] cell   Arrival 1200  100mg  metoprolol tartrate Aware contrast/nitro Denies iv issues Holding HCTZ

## 2023-03-21 ENCOUNTER — Telehealth: Payer: Self-pay | Admitting: *Deleted

## 2023-03-21 ENCOUNTER — Ambulatory Visit (HOSPITAL_COMMUNITY)
Admission: RE | Admit: 2023-03-21 | Discharge: 2023-03-21 | Disposition: A | Discharge status: Home / Self Care | Payer: Medicare HMO | Source: Ambulatory Visit | Attending: Internal Medicine | Admitting: Internal Medicine

## 2023-03-21 DIAGNOSIS — R072 Precordial pain: Secondary | ICD-10-CM

## 2023-03-21 DIAGNOSIS — I2510 Atherosclerotic heart disease of native coronary artery without angina pectoris: Secondary | ICD-10-CM | POA: Insufficient documentation

## 2023-03-21 DIAGNOSIS — R079 Chest pain, unspecified: Secondary | ICD-10-CM

## 2023-03-21 LAB — POCT I-STAT CREATININE: Creatinine, Ser: 0.8 mg/dL (ref 0.44–1.00)

## 2023-03-21 MED ORDER — NITROGLYCERIN 0.4 MG SL SUBL
0.8000 mg | SUBLINGUAL_TABLET | Freq: Once | SUBLINGUAL | Status: AC
  Administered 2023-03-21: 0.8 mg via SUBLINGUAL

## 2023-03-21 MED ORDER — IOHEXOL 350 MG/ML SOLN
95.0000 mL | Freq: Once | INTRAVENOUS | Status: AC | PRN
  Administered 2023-03-21: 95 mL via INTRAVENOUS

## 2023-03-21 MED ORDER — NITROGLYCERIN 0.4 MG SL SUBL
SUBLINGUAL_TABLET | SUBLINGUAL | Status: AC
  Filled 2023-03-21: qty 2

## 2023-03-21 NOTE — Telephone Encounter (Signed)
Patient would like to see if there is an alternative medication to [facility]. Patient states that it costs over $200 and she cannot afford it at this time.   Patient uses [facility]   Please call patient at [phone] with advice.

## 2023-03-22 ENCOUNTER — Ambulatory Visit (HOSPITAL_COMMUNITY)
Admission: RE | Admit: 2023-03-22 | Discharge: 2023-03-22 | Disposition: A | Discharge status: Home / Self Care | Payer: Medicare HMO | Source: Ambulatory Visit | Attending: Internal Medicine | Admitting: Internal Medicine

## 2023-03-22 DIAGNOSIS — I739 Peripheral vascular disease, unspecified: Secondary | ICD-10-CM | POA: Insufficient documentation

## 2023-03-27 NOTE — Progress Notes (Signed)
Vicki Blankenship, female    DOB: 22-Jun-1956    MRN: [id number]   Brief patient profile:  49 yowf  Active smoker  from [place]  grew up in house with smokers/ bronchitis/ear infections referred to pulmonary clinic in [place]  04/18/2022 by Dr Durene Cal.  Daily symptoms since 2011 placed on advair > thrush and since around 2012 just on albuterol and 02 hs since 2010    History of Present Illness  04/18/2022  Pulmonary/ 1st office eval/ Nichola Cieslinski / [place] [facility]  Chief Complaint  Patient presents with   Consult    Hx of COPD diagnosed around 2010.   Dyspnea:  [address] [employer]/ chest tightness walking to mailbox x years 200 ft uphill  to mailbox  Cough: worse since pna dec 2022 esp first thing worse x sev hours x brownish slt bloody mucus and epistaxis since jan 2023  Sleep: on 02 2lpm / flat bed 2 pillows   SABA use: once daily at most but very poor hfa 0 2  2lpm hs but not with activity  Rec We will call to get your last pft from [place] if available  Plan A = Automatic = Always=    Symbicort  80 (for Breztri) Take 2 puffs first thing in am and then another 2 puffs about 12 hours later.   Work on inhaler technique:  Zpak should improve your cough  Plan B = Backup (to supplement plan A, not to replace it) Only use your albuterol inhaler as a rescue medication Make sure you check your oxygen saturation  AT  your highest level of activity (not after you stop)   to be sure it stays over 90%  Suggested e-cigs as an optional  "one way bridge"  Off all tobacco products    CT chest 04/18/22  LDSCT Lung-RADS 4B, suspicious. Additional imaging evaluation or consultation with Pulmonology or Thoracic Surgery recommended. Right middle and right upper lobe pulmonary nodules, suspicious for primary bronchogenic carcinoma or carcinomas. Consider PET of the right middle lobe nodule. The right upper lobe nodule is at the low end of PET resolution. 2. No thoracic adenopathy. 3. Age advanced coronary  artery atherosclerosis. Recommend assessment of coronary risk factors. 4. Aortic atherosclerosis (ICD10-I70.0) and emphysema (ICD10-J43.9).  PET 06/02/22  1. Hypermetabolic medial right middle lobe nodule, high suspicion for malignancy. 2. The 6 by 4 mm right upper lobe nodule is not discernibly Hypermetabolic  3. Borderline enlarged right lower paratracheal lymph node has a maximum SUV of 2.4, equal to the blood pool. This tends to favor benign process.   06/06/2022  f/u ov/[place] office/Sir Mallis re: GOLD 0 copd/ RML nodule  maint on nothing since had to stop symbicort 80  due to mouth irritation   Chief Complaint  Patient presents with   Follow-up    Wants to talk about pet scan done on 6/29  Dyspnea:  [employer] shopping / uses [facility] parking due to  back problems  Cough: mucus is still green in am assoc with "terrible sinuses"  Sleeping: 2lpm flat bed coughing disturbs  SABA use: not using  02: 2lpm hs  Covid status: vax 5 / never infected  Rec Doxycycline 100 mg twice daily x 10 daily  Plan A = Automatic = Always=    Bevespi 2 puffs 1st thing in am and 12 hours later Work on inhaler technique:    Plan B = Backup (to supplement plan A, not to replace it) Only use your albuterol  inhaler as a rescue medication  For cough > mucinex dm 1200 mg every hours as needed  Prednisone 10 mg take  4 each am x 2 days,   2 each am x 2 days,  1 each am x 2 days and stop   S/p robotic assisted right middle lobectomy and wedge resection of the right upper lobe nodule on 07/25/2022.  The right upper lobe nodule showed adenomatous hyperplasia.  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   12/20/2022  f/u ov/[place] [facility]/Niharika Savino re: GOLD 2 copd maint on Bevespi   Chief Complaint  Patient presents with   Follow-up    Has phlegm in throat having a hard time getting up  Dyspnea:  mailbox and back better tol but  going  slower Cough: greenish mucus / already amoxicillin > no better  Sleeping: as soon as lie down and 1st thing in am   SABA use: not using  02: 2lpm hs x " long time" / not checking sats Covid status: vax max  Mid line cp since x weeks not pleuritic, no change with different  positions/ eating coughing  Rec Doxycycline 100 mg twice daily x 10 days  - take 30 min before eating with a large glass of water  Prednisone 10 mg take  4 each am x 2 days,   2 each am x 2 days,  1 each am x 2 days and stop  Change bevespi to [place] Take 2 puffs first thing in am and then another 2 puffs about 12 hours later.   Only use your albuterol as a rescue medication to be used if you can't catch your breath  Also  Ok to try albuterol 15 min before an activity (on alternating days)  that you know would usually make you short of breath  For cough > mucinex DM 1200 mg every 12 hours to help mucus get out and use flutter valve as much as you can The key is to stop smoking completely before smoking completely stops you! Please schedule a follow up office visit in 6 weeks, call sooner if needed with all medications /inhalers/ solutions in hand     02/02/2023  f/u ov/[place] office/Larico Dimock re: GOLD 2 copd/ MPNs/02 dep hs and prn  maint on bevespi Chief Complaint  Patient presents with   Follow-up    Had pet scan done and wants to talk about plan and tightness in throat and chest  Dyspnea:  walking a puppy named  Carly - walking fast past tol ok, has occ cp but no more walking than sitting with radiation to neck and R ear Cough: smoker's cough worse in am's >slt  yellow color/ same color from nose  Sleeping: bed is flat / 2 pillows  SABA use: not much  02: 2lpm hs / when cleaning also  Covid status: vax max  New midline cp x sev months/ lasts up to 30 min no more likely with activity or sitting still  Rec Zpak  Mucinex dm 1200 mg twice daily as needed for cough and use flutter valve as much as you can Make  sure you check your oxygen saturation  AT  your highest level of activity (not after you stop)   to be sure it stays over 90%  Prednisone 10 mg take  4 each am x 2 days,   2 each am x 2 days,  1 each am  x 2 days and stop Pantoprazole (protonix) 40 mg   Take  30-60 min before first meal of the day and add Pepcid (famotidine)  20 mg after supper until return to office GERD diet reviewed, bed blocks rec     03/29/2023  f/u ov/ office/Myeisha Kruser re: GOLD 2  on no maint rx  and still smoking  No chief complaint on file. Dyspnea:  still walking puppy 50 ft and starts coughing then sob  Cough: discolored worse in am  Sleeping: bed is flat 2 pillows  SABA use: every few hours since not on maint rx  02: 2lpm hs  and prn  Continues with cp now generalized ant and worse with coughing      No obvious day to day or daytime variability or assoc mucus plugs or hemoptysis or  , subjective wheeze or overt sinus or hb symptoms.     Also denies any obvious fluctuation of symptoms with weather or environmental changes or other aggravating or alleviating factors except as outlined above   No unusual exposure hx or h/o childhood pna/ asthma or knowledge of premature birth.  Current Allergies, Complete Past Medical History, Past Surgical History, Family History, and Social History were reviewed in [employer] record.  ROS  The following are not active complaints unless bolded Hoarseness, sore throat, dysphagia, dental problems, itching, sneezing,  nasal congestion or discharge of excess mucus or purulent secretions, ear ache,   fever, chills, sweats, unintended wt loss or wt gain,  ,  orthopnea pnd or arm/hand swelling  or leg swelling, presyncope, palpitations, abdominal pain, anorexia, nausea, vomiting, diarrhea  or change in bowel habits or change in bladder habits, change in stools or change in urine, dysuria, hematuria,  rash, arthralgias, visual complaints, headache, numbness,  weakness or ataxia or problems with walking or coordination,  change in mood or  memory.        Current Meds  Medication Sig   albuterol (PROVENTIL) (2.5 MG/3ML) 0.083% nebulizer solution Take 3 mLs (2.5 mg total) by nebulization every 4 (four) hours as needed for wheezing or shortness of breath.   Budeson-Glycopyrrol-Formoterol (BREZTRI AEROSPHERE) 160-9-4.8 MCG/ACT AERO Inhale 2 puffs into the lungs 2 (two) times daily.   doxycycline (VIBRA-TABS) 100 MG tablet Take 1 tablet (100 mg total) by mouth 2 (two) times daily.   predniSONE (DELTASONE) 10 MG tablet Take  4 each am x 2 days,   2 each am x 2 days,  1 each am x 2 days and stop             Past Medical History:  Diagnosis Date   Anxiety    Arthritis    Bipolar disorder (HCC)    COPD (chronic obstructive pulmonary disease) (HCC)    Depression    Diabetes (HCC)    Type II   Diabetes mellitus, type II (HCC)    Dyspnea    GERD (gastroesophageal reflux disease)    Headache    migraines- 3 times a week   HLD (hyperlipidemia)    HTN (hypertension)    Pneumonia    PTSD (post-traumatic stress disorder)        Objective:     Wts    02/02/2023       163  12/20/2022       159  08/16/2022       138   06/06/22 143 lb 3.2 oz (65 kg)  05/19/22 149 lb 14.6 oz (68 kg)  04/22/22 150 lb (  68 kg)    Vital signs reviewed  03/29/2023  - Note at rest 02 sats  91% on RA   General appearance:    chronically ill amb wf nad     HEENT : Oropharynx  clear    NECK :  without  apparent JVD/ palpable Nodes/TM    LUNGS: no acc muscle use,  Min barrel  contour chest wall with bilateral  slightly decreased bs s audible wheeze and  without cough on insp or exp maneuvers and min  Hyperresonant  to  percussion bilaterally    CV:  RRR  no s3 or murmur or increase in P2, and no edema   ABD:  soft and nontender with pos end  insp Hoover's  in the supine position.  No bruits or organomegaly appreciated   MS:  Nl gait/ ext warm without deformities  Or obvious joint restrictions  calf tenderness, cyanosis or clubbing     SKIN: warm and dry without lesions    NEURO:  alert, approp, nl sensorium with  no motor or cerebellar deficits apparent.          Assessment

## 2023-03-29 ENCOUNTER — Telehealth: Payer: Self-pay | Admitting: *Deleted

## 2023-03-29 ENCOUNTER — Encounter: Payer: Self-pay | Admitting: Internal Medicine

## 2023-03-29 ENCOUNTER — Ambulatory Visit: Payer: Medicare HMO | Admitting: Internal Medicine

## 2023-03-29 VITALS — BP 134/78 | HR 75 | Ht 64.0 in | Wt 157.2 lb

## 2023-03-29 DIAGNOSIS — J9611 Chronic respiratory failure with hypoxia: Secondary | ICD-10-CM | POA: Diagnosis not present

## 2023-03-29 DIAGNOSIS — J449 Chronic obstructive pulmonary disease, unspecified: Secondary | ICD-10-CM

## 2023-03-29 DIAGNOSIS — J9612 Chronic respiratory failure with hypercapnia: Secondary | ICD-10-CM

## 2023-03-29 DIAGNOSIS — F17200 Nicotine dependence, unspecified, uncomplicated: Secondary | ICD-10-CM | POA: Diagnosis not present

## 2023-03-29 MED ORDER — DOXYCYCLINE HYCLATE 100 MG PO TABS
100.0000 mg | ORAL_TABLET | Freq: Two times a day (BID) | ORAL | 11 refills | Status: DC
Start: 2023-03-29 — End: 2024-02-20

## 2023-03-29 MED ORDER — BREZTRI AEROSPHERE 160-9-4.8 MCG/ACT IN AERO
2.0000 | INHALATION_SPRAY | Freq: Two times a day (BID) | RESPIRATORY_TRACT | 11 refills | Status: AC
Start: 2023-03-29 — End: ?

## 2023-03-29 MED ORDER — PREDNISONE 10 MG PO TABS
ORAL_TABLET | ORAL | 11 refills | Status: DC
Start: 2023-03-29 — End: 2024-01-31

## 2023-03-29 MED ORDER — ALBUTEROL SULFATE (2.5 MG/3ML) 0.083% IN NEBU
2.5000 mg | INHALATION_SOLUTION | RESPIRATORY_TRACT | 12 refills | Status: AC | PRN
Start: 2023-03-29 — End: ?

## 2023-03-29 NOTE — Telephone Encounter (Signed)
Called and spoke w/ Renae Fickle (pharmacist @ [facility]) - he stated that they needed clarificastion on which inhaler pt was going to use as maintenance. Informed him that "Plan A" on Dr.Wert's AVS is the Breztri inhaler which she should use 2 puffs BID.  Renae Fickle verbalized understanding as is going to fill script now. NFN at time of call

## 2023-03-29 NOTE — Telephone Encounter (Signed)
[facility] [phone] called to discuss medication Breztri and wanted clarification.   Please call and advise. [phone]

## 2023-03-29 NOTE — Patient Instructions (Addendum)
For cough / congestion > mucinex dm 1200 mg every 12 hours as needed and use your flutter valve as much as possible   For nasty mucus >  doxycycline 100 mg twice daily x 7 days (refillable)   Work on inhaler technique:  relax and gently blow all the way out then take a nice smooth full deep breath back in, triggering the inhaler at same time you start breathing in.  Hold breath in for at least  5 seconds if you can. Blow out breztri thru nose. Rinse and gargle with water when done.  If mouth or throat bother you at all,  try brushing teeth/gums/tongue with arm and hammer toothpaste/ make a slurry and gargle and spit out.     Plan A = Automatic = Always=    breztri Take 2 puffs first thing in am and then another 2 puffs about 12 hours later.    Work on inhaler technique:  relax and gently blow all the way out then take a nice smooth full deep breath back in, triggering the inhaler at same time you start breathing in.  Hold breath in for at least  5 seconds if you can. Blow out breztri thru nose. Rinse and gargle with water when done.  If mouth or throat bother you at all,  try brushing teeth/gums/tongue with arm and hammer toothpaste/ make a slurry and gargle and spit out.     Plan B = Backup (to supplement plan A, not to replace it) Only use your albuterol inhaler as a rescue medication to be used if you can't catch your breath by resting or doing a relaxed purse lip breathing pattern.  - The less you use it, the better it will work when you need it. - Ok to use the inhaler up to 2 puffs  every 4 hours if you must but call for appointment if use goes up over your usual need - Don't leave home without it !!  (think of it like the spare tire for your car)   Plan C = Crisis (instead of Plan B but only if Plan B stops working) - only use your albuterol nebulizer if you first try Plan B and it fails to help > ok to use the nebulizer up to every 4 hours but if start needing it regularly call for  immediate appointment   Plan D = Deltasone Prednisone 10 mg take  4 each am x 2 days,   2 each am x 2 days,  1 each am x 2 days and stop (refillable)  Make sure you check your oxygen saturation  AT  your highest level of activity (not after you stop)   to be sure it stays over 90% and adjust  02 flow upward to maintain this level if needed but remember to turn it back to previous settings when you stop (to conserve your supply).   Please schedule a follow up visit in 3 months but call sooner if needed  with all RESPIRATORY  medications /inhalers/ solutions in hand so we can verify exactly what you are taking. This includes all medications from all doctors and over the counters

## 2023-03-30 ENCOUNTER — Other Ambulatory Visit (HOSPITAL_COMMUNITY): Payer: Self-pay

## 2023-03-30 ENCOUNTER — Encounter: Payer: Self-pay | Admitting: Internal Medicine

## 2023-03-30 NOTE — Assessment & Plan Note (Addendum)
Counseled re importance of smoking cessation but did not meet time criteria for separate billing    Each maintenance medication was reviewed in detail including emphasizing most importantly the difference between maintenance and prns and under what circumstances the prns are to be triggered using an action plan format where appropriate.  Total time for H and P, chart review, counseling, reviewing hfa/neb/02/flutter  device(s) and generating customized AVS unique to this office visit / same day charting > 30 min for multiple  refractory  chest   symptoms of uncertain etiology

## 2023-03-30 NOTE — Assessment & Plan Note (Signed)
[facility]   On 03/01/22 =  31 but as high as 35 in 2001  -  04/18/2022   Walked on RA  x  3  lap(s) =  approx 450  ft  @ moderate pace, stopped due to end of study with lowest 02 sats 91% cc sob @ 3rd lap  - 06/06/2022   Walked on RA   x  3  lap(s) =  approx 450  ft  @ fast pace, stopped due to end of walk with lowest 02 sats 90% with sob on 3rd lap   -08/16/2022   Walked on RA  x  3  lap(s) =  approx 450  ft  @ mod pace, stopped due to end of study s sob with lowest 02 sats 96%   Reminded: Make sure you check your oxygen saturation  AT  your highest level of activity (not after you stop)   to be sure it stays over 90% and adjust  02 flow upward to maintain this level if needed but remember to turn it back to previous settings when you stop (to conserve your supply).

## 2023-03-30 NOTE — Telephone Encounter (Signed)
Due to a filled and paid claim for Bevespi on 03-21-2023, test claims are unable to process for co-pays on alternatives

## 2023-03-30 NOTE — Assessment & Plan Note (Signed)
Quit smoking 07/2022  - PFTs 06/04/15 no significant obstruction/ ERV 20% at wt 189 / nl dlco  - Holter 01/22/16 x 24 h  Nl but no symptoms reported during monitor - 04/18/2022  After extensive coaching inhaler device,  effectiveness =    50% > try symbicort 80 2bid due to thrush on advair previously plus approp saba > could not tolerate oral side effects of ics - 06/06/2022   > try Bevespi 2 bid and practice with empty Breztri container - PFT's  07/05/22  FEV1 1.86 (76 % ) ratio 0.65  p 7 % improvement from saba p BREO prior to study with DLCO  14.95 (75%)   and FV curve mild concavity     12/20/2022  After extensive coaching inhaler device,  effectiveness =    60% from a baseline of about 30% and try bevespi  - 03/29/2023  After extensive coaching inhaler device,  effectiveness =    80% > change to breztri    Group D (now reclassified as E) in terms of symptom/risk and laba/lama/ICS  therefore appropriate rx at this point >>>  breztri and more approp saba prn   Re SABA :  I spent extra time with pt today reviewing appropriate use of albuterol for prn use on exertion with the following points: 1) saba is for relief of sob that does not improve by walking a slower pace or resting but rather if the pt does not improve after trying this first. 2) If the pt is convinced, as many are, that saba helps recover from activity faster then it's easy to tell if this is the case by re-challenging : ie stop, take the inhaler, then p 5 minutes try the exact same activity (intensity of workload) that just caused the symptoms and see if they are substantially diminished or not after saba 3) if there is an activity that reproducibly causes the symptoms, try the saba 15 min before the activity on alternate days   If in fact the saba really does help, then fine to continue to use it prn but advised may need to look closer at the maintenance regimen being used to achieve better control of airways disease with exertion.   Also  add doxy x 7 day/ pred x 6 as part of action plan   Mucnex dm 1200 mg bid and flutter valve

## 2023-04-11 ENCOUNTER — Telehealth: Payer: Self-pay | Admitting: Internal Medicine

## 2023-04-11 NOTE — Telephone Encounter (Signed)
Where does she want [place] to send order to for her machine?  Called the pt and there was no answer and her VM was full   Will route to [place] to try her tomorrow, thanks

## 2023-04-11 NOTE — Telephone Encounter (Signed)
Pt. Has the neb. Meds but no order for machine was placed please advise pt.

## 2023-04-12 ENCOUNTER — Other Ambulatory Visit: Payer: Self-pay

## 2023-04-12 DIAGNOSIS — J449 Chronic obstructive pulmonary disease, unspecified: Secondary | ICD-10-CM

## 2023-04-12 DIAGNOSIS — J9611 Chronic respiratory failure with hypoxia: Secondary | ICD-10-CM

## 2023-04-12 NOTE — Telephone Encounter (Signed)
Patient would like to get Nebulizer machine thru [facility]. Patient phone number is [phone].

## 2023-04-12 NOTE — Telephone Encounter (Signed)
ATC pt unable to LVM due to VM being full. Order to [facility] has been placed. NFN att.

## 2023-04-20 ENCOUNTER — Other Ambulatory Visit: Payer: Self-pay | Admitting: Internal Medicine

## 2023-04-20 DIAGNOSIS — J449 Chronic obstructive pulmonary disease, unspecified: Secondary | ICD-10-CM

## 2023-05-02 ENCOUNTER — Encounter: Payer: Self-pay | Admitting: Internal Medicine

## 2023-05-23 ENCOUNTER — Ambulatory Visit (HOSPITAL_COMMUNITY)
Admission: RE | Admit: 2023-05-23 | Discharge: 2023-05-23 | Disposition: A | Discharge status: Home / Self Care | Payer: Medicare HMO | Source: Ambulatory Visit | Attending: Hematology | Admitting: Hematology

## 2023-05-23 ENCOUNTER — Inpatient Hospital Stay: Payer: Medicare HMO | Attending: Hematology

## 2023-05-23 DIAGNOSIS — C3491 Malignant neoplasm of unspecified part of right bronchus or lung: Secondary | ICD-10-CM | POA: Diagnosis present

## 2023-05-23 DIAGNOSIS — Z85118 Personal history of other malignant neoplasm of bronchus and lung: Secondary | ICD-10-CM | POA: Insufficient documentation

## 2023-05-23 DIAGNOSIS — Z902 Acquired absence of lung [part of]: Secondary | ICD-10-CM | POA: Insufficient documentation

## 2023-05-23 DIAGNOSIS — F17210 Nicotine dependence, cigarettes, uncomplicated: Secondary | ICD-10-CM | POA: Insufficient documentation

## 2023-05-23 LAB — CBC WITH DIFFERENTIAL/PLATELET
Abs Immature Granulocytes: 0.04 10*3/uL (ref 0.00–0.07)
Basophils Absolute: 0.1 10*3/uL (ref 0.0–0.1)
Basophils Relative: 1 %
Eosinophils Absolute: 0.3 10*3/uL (ref 0.0–0.5)
Eosinophils Relative: 2 %
HCT: 46.2 % — ABNORMAL HIGH (ref 36.0–46.0)
Hemoglobin: 14.9 g/dL (ref 12.0–15.0)
Immature Granulocytes: 0 %
Lymphocytes Relative: 18 %
Lymphs Abs: 2.1 10*3/uL (ref 0.7–4.0)
MCH: 31.6 pg (ref 26.0–34.0)
MCHC: 32.3 g/dL (ref 30.0–36.0)
MCV: 97.9 fL (ref 80.0–100.0)
Monocytes Absolute: 1.1 10*3/uL — ABNORMAL HIGH (ref 0.1–1.0)
Monocytes Relative: 10 %
Neutro Abs: 7.9 10*3/uL — ABNORMAL HIGH (ref 1.7–7.7)
Neutrophils Relative %: 69 %
Platelets: 304 10*3/uL (ref 150–400)
RBC: 4.72 MIL/uL (ref 3.87–5.11)
RDW: 12.9 % (ref 11.5–15.5)
WBC: 11.4 10*3/uL — ABNORMAL HIGH (ref 4.0–10.5)
nRBC: 0 % (ref 0.0–0.2)

## 2023-05-23 LAB — COMPREHENSIVE METABOLIC PANEL
ALT: 40 U/L (ref 0–44)
AST: 36 U/L (ref 15–41)
Albumin: 4.2 g/dL (ref 3.5–5.0)
Alkaline Phosphatase: 79 U/L (ref 38–126)
Anion gap: 8 (ref 5–15)
BUN: 10 mg/dL (ref 8–23)
CO2: 30 mmol/L (ref 22–32)
Calcium: 10.6 mg/dL — ABNORMAL HIGH (ref 8.9–10.3)
Chloride: 97 mmol/L — ABNORMAL LOW (ref 98–111)
Creatinine, Ser: 0.72 mg/dL (ref 0.44–1.00)
GFR, Estimated: >60 mL/min (ref >60)
Glucose, Bld: 102 mg/dL — ABNORMAL HIGH (ref 70–99)
Potassium: 3.5 mmol/L (ref 3.5–5.1)
Sodium: 135 mmol/L (ref 135–145)
Total Bilirubin: 0.9 mg/dL (ref 0.3–1.2)
Total Protein: 7.6 g/dL (ref 6.5–8.1)

## 2023-05-23 MED ORDER — IOHEXOL 300 MG/ML  SOLN
75.0000 mL | Freq: Once | INTRAMUSCULAR | Status: AC | PRN
  Administered 2023-05-23: 75 mL via INTRAVENOUS

## 2023-05-30 ENCOUNTER — Inpatient Hospital Stay (HOSPITAL_BASED_OUTPATIENT_CLINIC_OR_DEPARTMENT_OTHER): Payer: Medicare HMO | Admitting: Hematology

## 2023-05-30 VITALS — BP 139/87 | HR 77 | Temp 98.4°F | Resp 20 | Wt 148.3 lb

## 2023-05-30 DIAGNOSIS — C3491 Malignant neoplasm of unspecified part of right bronchus or lung: Secondary | ICD-10-CM

## 2023-05-30 DIAGNOSIS — F17210 Nicotine dependence, cigarettes, uncomplicated: Secondary | ICD-10-CM | POA: Diagnosis not present

## 2023-05-30 DIAGNOSIS — Z902 Acquired absence of lung [part of]: Secondary | ICD-10-CM | POA: Diagnosis not present

## 2023-05-30 DIAGNOSIS — Z85118 Personal history of other malignant neoplasm of bronchus and lung: Secondary | ICD-10-CM | POA: Diagnosis present

## 2023-05-30 MED ORDER — PREGABALIN 75 MG PO CAPS: 75.0000 mg | ORAL_CAPSULE | Freq: Two times a day (BID) | ORAL | 5 refills | Status: DC

## 2023-05-30 NOTE — Progress Notes (Signed)
[facility] [address] [place] [address]    Clinic Day:  05/30/2023  Referring physician: Karl Bales, NP  Patient Care Team: Karl Bales, NP as PCP - General (Family Medicine) Marjo Bicker, MD as PCP - Cardiology (Cardiology) Jena Gauss Gerrit Friends, MD as Consulting Physician (Gastroenterology) Doreatha Massed, MD as Medical Oncologist (Medical Oncology) Therese Sarah, RN as Oncology Nurse Navigator (Medical Oncology)   ASSESSMENT & PLAN:   Assessment: 1.  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 - PET (06/02/2022): Right middle lobe nodule 1.3 x 0.7 cm, SUV 4.8.  6 x 4 mm right upper lobe subpleural nodule with no activity.  1 cm short axis lower paratracheal node, SUV 2.4. - Right Middle lobectomy, lymph node biopsy, right upper lobe wedge resection on 07/25/2022 - Pathology: 1.5 x 1.1 x 1 cm squamous cell carcinoma, margins negative, negative visceral pleural involvement, lymph node stations 4R, 7, 9, 11, 12 negative for metastatic disease.  IHC positive for CK5/6, p63 and negative for TTF-1.  Ki-67 shows increased proliferation.   2.  Social/family history: - She lives at home with her husband.  Quit smoking on 07/24/2022.  She uses oxygen 2 L at bedtime.  Smoked 1 pack/day for 50 years.  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 - Father died of metastatic bladder cancer.  Mother had breast cancer.  2 paternal uncles had brain cancer and prostate cancer.    Plan: 1.  Stage I (T1b N0 M0) squamous cell carcinoma of the right middle lobe of the lung: - She does not report any change in her baseline shortness of breath or cough. - Reviewed CT chest with contrast from 05/23/2023: Stable postsurgical changes with no evidence of recurrence.  Unchanged increased caliber of the CBD with mild  intrahepatic bile duct dilatation with no symptoms. - Labs: Mild hypercalcemia of 10.6 which is stable.  She takes vitamin D 1000 units daily.  Mild leukocytosis most likely from [facility] inhaler. - Recommend follow-up in 6 months with repeat CT scan and labs.   2.  Peripheral neuropathy (feet worse than hands): - Continue Lyrica 75 mg twice daily.  I have sent a refill.    Orders Placed This Encounter  Procedures   CT Chest W Contrast    Standing Status:   Future    Standing Expiration Date:   05/29/2024    Order Specific Question:   If indicated for the ordered procedure, I authorize the administration of contrast media per Radiology protocol    Answer:   Yes    Order Specific Question:   Does the patient have a contrast media/X-ray dye allergy?    Answer:   No    Order Specific Question:   Preferred imaging location?    Answer:   [facility]    Order Specific Question:   Release to patient    Answer:   Immediate [1]   CBC with Differential/Platelet    Standing Status:   Future    Standing Expiration Date:   05/29/2024    Order Specific Question:   Release to patient    Answer:   Immediate   Comprehensive metabolic panel    Standing Status:   Future    Standing Expiration Date:   05/29/2024    Order Specific Question:  Release to patient    Answer:   Immediate   VITAMIN D 25 Hydroxy (Vit-D Deficiency, Fractures)    Standing Status:   Future    Standing Expiration Date:   05/29/2024    Order Specific Question:   Release to patient    Answer:   Immediate      I,Katie Daubenspeck,acting as a scribe for Doreatha Massed, MD.,have documented all relevant documentation on the behalf of Doreatha Massed, MD,as directed by  Doreatha Massed, MD while in the presence of Doreatha Massed, MD.   I, Doreatha Massed MD, have reviewed the above documentation for accuracy and completeness, and I agree with the above.   Doreatha Massed, MD   6/25/20245:36  PM  CHIEF COMPLAINT:   Diagnosis: stage I right lung squamous cell carcinoma    Cancer Staging  Squamous cell lung cancer, right (HCC) Staging form: Lung, AJCC 8th Edition - Clinical stage from 08/29/2022: Stage IA2 (cT1b, cN0, cM0) - Signed by Doreatha Massed, MD on 08/29/2022    Prior Therapy: Right middle lobectomy, lymph node biopsy, right upper lobe wedge resection on 07/25/2022   Current Therapy:  Surveillance    HISTORY OF PRESENT ILLNESS:   Oncology History  Squamous cell lung cancer, right (HCC)  08/29/2022 Initial Diagnosis   Squamous cell lung cancer, right (HCC)   08/29/2022 Cancer Staging   Staging form: Lung, AJCC 8th Edition - Clinical stage from 08/29/2022: Stage IA2 (cT1b, cN0, cM0) - Signed by Doreatha Massed, MD on 08/29/2022 Histopathologic type: Squamous cell carcinoma, keratinizing, NOS Stage prefix: Initial diagnosis      INTERVAL HISTORY:   Vicki Blankenship is a 67 y.o. female presenting to clinic today for follow up of stage I right lung squamous cell carcinoma. She was last seen by me on 12/01/22.  Since her last visit, she underwent restaging PET scan on 12/29/22 showing: no findings for recurrent tumor; no hypermetabolic lymph nodes; no findings for distant or osseous metastatic disease.  More recently, she also underwent chest CT on 05/23/23 showing: no specific findings to suggest locally recurrent tumor or metastatic disease.  Today, she states that she is doing well overall. Her appetite level is at 65%. Her energy level is at 10%.  PAST MEDICAL HISTORY:   Past Medical History: Past Medical History:  Diagnosis Date   Anemia    Anxiety    Aortic atherosclerosis (HCC)    Arthritis    Bipolar disorder (HCC)    Chronic bronchitis with COPD (chronic obstructive pulmonary disease)    COPD (chronic obstructive pulmonary disease) (HCC)    Depression    Diabetes (HCC)    Type II   Diabetes mellitus, type II (HCC)    Dyspnea    GERD  (gastroesophageal reflux disease)    Head injury    Headache    migraines- 3 times a week   HLD (hyperlipidemia)    HTN (hypertension)    Lesion of lung 2023   suspect lung cancer per pt   LOC (loss of consciousness) (HCC)    Meningioma (HCC)    L frontal   Pneumonia    PTSD (post-traumatic stress disorder)    Seizure (HCC)    hx of    Surgical History: Past Surgical History:  Procedure Laterality Date   BIOPSY  05/23/2022   Procedure: BIOPSY;  Surgeon: Lanelle Bal, DO;  [facility];  Service: Endoscopy;;   CARPAL TUNNEL RELEASE Right    CHOLECYSTECTOMY  2013   COLONOSCOPY  2015   In [place].  Per patient, she had colon polyps.   COLONOSCOPY WITH PROPOFOL N/A 05/23/2022   Procedure: COLONOSCOPY WITH PROPOFOL;  Surgeon: Lanelle Bal, DO;  [facility];  Service: Endoscopy;  Laterality: N/A;  11:00am   ESOPHAGOGASTRODUODENOSCOPY (EGD) WITH PROPOFOL N/A 05/23/2022   Procedure: ESOPHAGOGASTRODUODENOSCOPY (EGD) WITH PROPOFOL;  Surgeon: Lanelle Bal, DO;  [facility];  Service: Endoscopy;  Laterality: N/A;   FOOT SURGERY Left    INTERCOSTAL NERVE BLOCK Right 07/25/2022   Procedure: INTERCOSTAL NERVE BLOCK;  Surgeon: Loreli Slot, MD;  Location: [facility] OR;  Service: Thoracic;  Laterality: Right;   LUMBAR LAMINECTOMY/ DECOMPRESSION WITH MET-RX Left 04/14/2020   Procedure: Left Lumbar Four-Five Minimally invasive lumbar decompression;  Surgeon: Jadene Pierini, MD;  Location: [facility] OR;  Service: Neurosurgery;  Laterality: Left;  Left Lumbar Four-Five Minimally invasive lumbar decompression   LYMPH NODE DISSECTION Right 07/25/2022   Procedure: LYMPH NODE DISSECTION;  Surgeon: Loreli Slot, MD;  Location: [facility] OR;  Service: Thoracic;  Laterality: Right;   POLYPECTOMY  05/23/2022   Procedure: POLYPECTOMY;  Surgeon: Lanelle Bal, DO;  [facility]: [facility];  Service: Endoscopy;;   thumb surgery Left    pin placed    TUBAL LIGATION     VIDEO BRONCHOSCOPY WITH ENDOBRONCHIAL NAVIGATION N/A 07/25/2022   Procedure: VIDEO BRONCHOSCOPY WITH ENDOBRONCHIAL NAVIGATION;  Surgeon: Loreli Slot, MD;  Location: [facility] OR;  Service: Thoracic;  Laterality: N/A;    Social History: Social History   Socioeconomic History   Marital status: Married    Spouse name: Not on file   Number of children: 2   Years of education: Not on file   Highest education level: Not on file  Occupational History   Not on file  Tobacco Use   Smoking status: Every Day    Packs/day: 0.50    Years: 40.00    Additional pack years: 0.00    Total pack years: 20.00    Types: Cigarettes    Last attempt to quit: 07/15/2022    Years since quitting: 0.8   Smokeless tobacco: Never  Vaping Use   Vaping Use: Former  Substance and Sexual Activity   Alcohol use: Never   Drug use: Not Currently   Sexual activity: Yes  Other Topics Concern   Not on file  Social History Narrative   Lives with husband   Right handed   Caffeine: coffee 4 cups in AM, throughout day 2-3 sodas   Social Determinants of Health   Financial Resource Strain: Low Risk  (08/29/2022)   Overall Financial Resource Strain (CARDIA)    Difficulty of Paying Living Expenses: Not very hard  Food Insecurity: No Food Insecurity (08/29/2022)   Hunger Vital Sign    Worried About Running Out of Food in the Last Year: Never true    Ran Out of Food in the Last Year: Never true  Transportation Needs: No Transportation Needs (08/29/2022)   PRAPARE - Administrator, Civil Service (Medical): No    Lack of Transportation (Non-Medical): No  Physical Activity: Insufficiently Active (08/29/2022)   Exercise Vital Sign    Days of Exercise per Week: 3 days    Minutes of Exercise per Session: 30 min  Stress: Stress Concern Present (08/29/2022)   [employer] of Occupational Health - Occupational Stress Questionnaire    Feeling of Stress : To some extent  Social  Connections: Moderately Integrated (08/29/2022)  Social Connection and Isolation Panel [NHANES]    Frequency of Communication with Friends and Family: Three times a week    Frequency of Social Gatherings with Friends and Family: Three times a week    Attends Religious Services: Never    Active Member of Clubs or Organizations: Yes    Attends Banker Meetings: 1 to 4 times per year    Marital Status: Married  Catering manager Violence: Not At Risk (08/29/2022)   Humiliation, Afraid, Rape, and Kick questionnaire    Fear of Current or Ex-Partner: No    Emotionally Abused: No    Physically Abused: No    Sexually Abused: No    Family History: Family History  Problem Relation Age of Onset   Migraines Mother    Diabetes Mother    Dementia Mother    Cancer Father    Heart disease Father    Colon cancer Neg Hx     Current Medications:  Current Outpatient Medications:    albuterol (PROVENTIL) (2.5 MG/3ML) 0.083% nebulizer solution, Take 3 mLs (2.5 mg total) by nebulization every 4 (four) hours as needed for wheezing or shortness of breath., Disp: 75 mL, Rfl: 12   albuterol (VENTOLIN HFA) 108 (90 Base) MCG/ACT inhaler, Inhale 1-2 puffs into the lungs every 6 (six) hours as needed for wheezing or shortness of breath., Disp: 8 g, Rfl: 3   ARIPiprazole (ABILIFY) 5 MG tablet, 1/2 tablet for 1 week, then 1 tablet daily Orally Once a day for 30 days, Disp: , Rfl:    aspirin 81 MG chewable tablet, Chew 81 mg by mouth daily., Disp: , Rfl:    atorvastatin (LIPITOR) 40 MG tablet, Take 40 mg by mouth daily., Disp: , Rfl:    Budeson-Glycopyrrol-Formoterol (BREZTRI AEROSPHERE) 160-9-4.8 MCG/ACT AERO, Inhale 2 puffs into the lungs 2 (two) times daily., Disp: 10.7 g, Rfl: 11   clonazePAM (KLONOPIN) 0.5 MG tablet, Take 0.5 mg by mouth 3 (three) times daily., Disp: , Rfl:    doxycycline (VIBRA-TABS) 100 MG tablet, Take 1 tablet (100 mg total) by mouth 2 (two) times daily., Disp: 14 tablet, Rfl:  11   DULoxetine (CYMBALTA) 30 MG capsule, Take 30 mg by mouth daily at 12 noon., Disp: , Rfl:    DULoxetine (CYMBALTA) 60 MG capsule, Take 60 mg by mouth in the morning., Disp: , Rfl:    famotidine (PEPCID) 20 MG tablet, One after supper, Disp: 30 tablet, Rfl: 11   hydrochlorothiazide (HYDRODIURIL) 25 MG tablet, Take 25 mg by mouth daily., Disp: , Rfl:    lithium carbonate 300 MG capsule, Take 300 mg by mouth in the morning and at bedtime., Disp: , Rfl:    losartan (COZAAR) 25 MG tablet, Take 25 mg by mouth daily., Disp: , Rfl:    metFORMIN (GLUCOPHAGE) 500 MG tablet, Take 500 mg by mouth daily with breakfast., Disp: , Rfl:    metoprolol tartrate (LOPRESSOR) 100 MG tablet, Take 1 tablet by mouth 2 hours prior to CT scan., Disp: 1 tablet, Rfl: 0   Multiple Vitamin (MULTIVITAMIN ADULT PO), Take 1 tablet by mouth daily., Disp: , Rfl:    naloxone (NARCAN) 4 MG/0.1ML LIQD nasal spray kit, Place 1 spray into the nose as needed (opioid overdose)., Disp: , Rfl:    OXYGEN, Inhale 2 L into the lungs continuous., Disp: , Rfl:    pantoprazole (PROTONIX) 40 MG tablet, Take 1 tablet (40 mg total) by mouth daily before breakfast., Disp: 90 tablet, Rfl: 3  predniSONE (DELTASONE) 10 MG tablet, Take  4 each am x 2 days,   2 each am x 2 days,  1 each am x 2 days and stop, Disp: 14 tablet, Rfl: 11   vitamin C (ASCORBIC ACID) 500 MG tablet, Take 500 mg by mouth daily., Disp: , Rfl:    Vitamin D, Cholecalciferol, 25 MCG (1000 UT) TABS, Take 1,000 Units by mouth daily., Disp: , Rfl:    pregabalin (LYRICA) 75 MG capsule, Take 1 capsule (75 mg total) by mouth 2 (two) times daily., Disp: 60 capsule, Rfl: 5   Allergies: Allergies  Allergen Reactions   Penicillins Swelling    Tongue swelling   Wellbutrin [Bupropion]     shakes    REVIEW OF SYSTEMS:   Review of Systems  Constitutional:  Negative for chills, fatigue and fever.  HENT:   Negative for lump/mass, mouth sores, nosebleeds, sore throat and trouble  swallowing.   Eyes:  Negative for eye problems.  Respiratory:  Positive for cough and wheezing. Negative for shortness of breath.   Cardiovascular:  Negative for chest pain, leg swelling and palpitations.  Gastrointestinal:  Positive for nausea. Negative for abdominal pain, constipation, diarrhea and vomiting.  Genitourinary:  Negative for bladder incontinence, difficulty urinating, dysuria, frequency, hematuria and nocturia.   Musculoskeletal:  Negative for arthralgias, back pain, flank pain, myalgias and neck pain.  Skin:  Negative for itching and rash.  Neurological:  Positive for headaches and numbness. Negative for dizziness.  Hematological:  Does not bruise/bleed easily.  Psychiatric/Behavioral:  Positive for depression. Negative for sleep disturbance and suicidal ideas. The patient is nervous/anxious.   All other systems reviewed and are negative.    VITALS:   Blood pressure 139/87, pulse 77, temperature 98.4 F (36.9 C), temperature source Tympanic, resp. rate 20, weight 148 lb 4.8 oz (67.3 kg), SpO2 100 %.  Wt Readings from Last 3 Encounters:  05/30/23 148 lb 4.8 oz (67.3 kg)  03/30/23 157 lb 3.2 oz (71.3 kg)  03/16/23 158 lb (71.7 kg)    Body mass index is 25.46 kg/m.  Performance status (ECOG): 1 - Symptomatic but completely ambulatory  PHYSICAL EXAM:   Physical Exam Vitals and nursing note reviewed. Exam conducted with a chaperone present.  Constitutional:      Appearance: Normal appearance.  Cardiovascular:     Rate and Rhythm: Normal rate and regular rhythm.     Pulses: Normal pulses.     Heart sounds: Normal heart sounds.  Pulmonary:     Effort: Pulmonary effort is normal.     Breath sounds: Normal breath sounds.  Abdominal:     Palpations: Abdomen is soft. There is no hepatomegaly, splenomegaly or mass.     Tenderness: There is no abdominal tenderness.  Musculoskeletal:     Right lower leg: No edema.     Left lower leg: No edema.  Lymphadenopathy:      Cervical: No cervical adenopathy.     Right cervical: No superficial, deep or posterior cervical adenopathy.    Left cervical: No superficial, deep or posterior cervical adenopathy.     Upper Body:     Right upper body: No supraclavicular or axillary adenopathy.     Left upper body: No supraclavicular or axillary adenopathy.  Neurological:     General: No focal deficit present.     Mental Status: She is alert and oriented to person, place, and time.  Psychiatric:        Mood and Affect: Mood normal.  Behavior: Behavior normal.     LABS:      Latest Ref Rng & Units 05/23/2023    1:03 PM 11/24/2022    8:30 AM 07/27/2022    6:29 AM  CBC  WBC 4.0 - 10.5 K/uL 11.4  6.0  10.8   Hemoglobin 12.0 - 15.0 g/dL 16.1  09.6  04.5   Hematocrit 36.0 - 46.0 % 46.2  44.5  37.5   Platelets 150 - 400 K/uL 304  228  210       Latest Ref Rng & Units 05/23/2023    1:03 PM 03/21/2023   12:49 PM 11/24/2022    8:30 AM  CMP  Glucose 70 - 99 mg/dL 409   84   BUN 8 - 23 mg/dL 10   8   Creatinine 8.11 - 1.00 mg/dL 9.14  7.82  9.56   Sodium 135 - 145 mmol/L 135   139   Potassium 3.5 - 5.1 mmol/L 3.5   3.4   Chloride 98 - 111 mmol/L 97   101   CO2 22 - 32 mmol/L 30   29   Calcium 8.9 - 10.3 mg/dL 21.3   08.6   Total Protein 6.5 - 8.1 g/dL 7.6   7.2   Total Bilirubin 0.3 - 1.2 mg/dL 0.9   0.5   Alkaline Phos 38 - 126 U/L 79   90   AST 15 - 41 U/L 36   35   ALT 0 - 44 U/L 40   32      No results found for: "CEA1", "CEA" / No results found for: "CEA1", "CEA" No results found for: "PSA1" No results found for: "VHQ469" No results found for: "CAN125"  No results found for: "TOTALPROTELP", "ALBUMINELP", "A1GS", "A2GS", "BETS", "BETA2SER", "GAMS", "MSPIKE", "SPEI" No results found for: "TIBC", "FERRITIN", "IRONPCTSAT" No results found for: "LDH"   STUDIES:   CT Chest W Contrast  Result Date: 05/28/2023 CLINICAL DATA:  Restaging stage I right lung squamous cell carcinoma status post right  middle lobectomy. * Tracking Code: BO * EXAM: CT CHEST WITH CONTRAST TECHNIQUE: Multidetector CT imaging of the chest was performed during intravenous contrast administration. RADIATION DOSE REDUCTION: This exam was performed according to the departmental dose-optimization program which includes automated exposure control, adjustment of the mA and/or kV according to patient size and/or use of iterative reconstruction technique. CONTRAST:  75mL OMNIPAQUE IOHEXOL 300 MG/ML  SOLN COMPARISON:  PET-CT 12/29/2022 FINDINGS: Cardiovascular: Heart size is normal. Aortic atherosclerosis. Coronary artery calcifications. No pericardial effusion. Mediastinum/Nodes: Thyroid gland, trachea and esophagus are unremarkable. No enlarged mediastinal or hilar lymph nodes. Lungs/Pleura: Postsurgical changes from prior right middle lobectomy and wedge resection from the peripheral right upper lobe. Emphysema. No pleural fluid, airspace consolidation, atelectasis or pneumothorax. Mild diffuse bronchial wall thickening. No suspicious pulmonary nodule or mass. No specific findings identified to suggest locally recurrent tumor or metastatic disease within the chest. Upper Abdomen: No acute abnormality. Status post cholecystectomy. Unchanged increase caliber of the common bile duct with mild intrahepatic bile duct dilatation which in the absence of signs/symptoms of biliary obstruction most likely reflects post cholecystectomy physiology. Cyst identified within upper pole of the left kidney measures 2.4 cm. No follow-up imaging recommended. Musculoskeletal: No acute or suspicious osseous abnormalities. IMPRESSION: 1. Stable postsurgical changes from prior right middle lobectomy and wedge resection from the peripheral right upper lobe. No specific findings identified to suggest locally recurrent tumor or metastatic disease within the chest. 2. Coronary artery calcifications. 3.  Unchanged increase caliber of the common bile duct with mild  intrahepatic bile duct dilatation which, which in the absence of signs/symptoms of biliary obstruction, most likely reflects post cholecystectomy physiology. 4. Aortic Atherosclerosis (ICD10-I70.0) and Emphysema (ICD10-J43.9). Electronically Signed   By: Signa Kell M.D.   On: 05/28/2023 05:23

## 2023-05-30 NOTE — Patient Instructions (Signed)
[facility]  Discharge Instructions  You were seen and examined today by Dr. Katragadda.  Dr. Katragadda discussed your most recent lab work and CT scan which revealed that everything looks good and stable.  Follow-up as scheduled in 6 months.    Thank you for choosing  [facility] to provide your oncology and hematology care.   To afford each patient quality time with our provider, please arrive at least 15 minutes before your scheduled appointment time. You may need to reschedule your appointment if you arrive late (10 or more minutes). Arriving late affects you and other patients whose appointments are after yours.  Also, if you miss three or more appointments without notifying the office, you may be dismissed from the clinic at the provider's discretion.    Again, thank you for choosing [facility].  Our hope is that these requests will decrease the amount of time that you wait before being seen by our physicians.   If you have a lab appointment with the Cancer Center - please note that after April 8th, all labs will be drawn in the cancer center.  You do not have to check in or register with the main entrance as you have in the past but will complete your check-in at the cancer center.            _____________________________________________________________  Should you have questions after your visit to [facility], please contact our office at [phone] and follow the prompts.  Our office hours are 8:00 a.m. to 4:30 p.m. Monday - Thursday and 8:00 a.m. to 2:30 p.m. Friday.  Please note that voicemails left after 4:00 p.m. may not be returned until the following business day.  We are closed weekends and all major holidays.  You do have access to a nurse 24-7, just call the main number to the clinic [phone] and do not press any options, hold on the line and a nurse will answer the phone.    For  prescription refill requests, have your pharmacy contact our office and allow 72 hours.    Masks are no longer required in the cancer centers. If you would like for your care team to wear a mask while they are taking care of you, please let them know. You may have one support person who is at least 65 years old accompany you for your appointments.  

## 2023-05-31 ENCOUNTER — Encounter: Payer: Self-pay | Admitting: *Deleted

## 2023-05-31 NOTE — Progress Notes (Signed)
PA approved by plan for Lyrica 75 mg capsules until 12/05/23

## 2023-06-26 ENCOUNTER — Other Ambulatory Visit: Payer: Self-pay

## 2023-06-26 MED ORDER — FAMOTIDINE 20 MG PO TABS: ORAL_TABLET | ORAL | 11 refills | Status: DC

## 2023-06-28 ENCOUNTER — Encounter: Payer: Self-pay | Admitting: Internal Medicine

## 2023-06-28 ENCOUNTER — Ambulatory Visit: Payer: Medicare HMO | Admitting: Internal Medicine

## 2023-06-28 VITALS — BP 123/78 | HR 72 | Ht 64.0 in | Wt 146.0 lb

## 2023-06-28 DIAGNOSIS — R45851 Suicidal ideations: Secondary | ICD-10-CM | POA: Insufficient documentation

## 2023-06-28 DIAGNOSIS — F319 Bipolar disorder, unspecified: Secondary | ICD-10-CM | POA: Insufficient documentation

## 2023-06-28 DIAGNOSIS — Z87891 Personal history of nicotine dependence: Secondary | ICD-10-CM | POA: Insufficient documentation

## 2023-06-28 DIAGNOSIS — R55 Syncope and collapse: Secondary | ICD-10-CM | POA: Insufficient documentation

## 2023-06-28 DIAGNOSIS — J449 Chronic obstructive pulmonary disease, unspecified: Secondary | ICD-10-CM | POA: Diagnosis not present

## 2023-06-28 DIAGNOSIS — I952 Hypotension due to drugs: Secondary | ICD-10-CM | POA: Insufficient documentation

## 2023-06-28 NOTE — Progress Notes (Signed)
Vicki Blankenship, female    DOB: 1956/05/27    MRN: [id number]   Brief patient profile:  67 yowf  quit smoking 06/2023  from [place]  grew up in house with smokers/ bronchitis/ear infections referred to pulmonary clinic in [place]  04/18/2022 by Dr Durene Cal.  Daily symptoms since 2011 placed on advair > thrush and since around 2012 just on albuterol and 02 hs since 2010    History of Present Illness  04/18/2022  Pulmonary/ 1st Blankenship eval/ Vicki Blankenship / Vicki Blankenship  Chief Complaint  Patient presents with   Consult    Hx of COPD diagnosed around 2010.   Dyspnea:  [employer]/ chest tightness walking to mailbox x years 200 ft uphill  to mailbox  Cough: worse since pna dec 2022 esp first thing worse x sev hours x brownish slt bloody mucus and epistaxis since jan 2023  Sleep: on 02 2lpm / flat bed 2 pillows   SABA use: once daily at most but very poor hfa 0 2  2lpm hs but not with activity  Rec We will call to get your last pft from [place] if available  Plan A = Automatic = Always=    Symbicort  80 (for Breztri) Take 2 puffs first thing in am and then another 2 puffs about 12 hours later.   Work on inhaler technique:  Zpak should improve your cough  Plan B = Backup (to supplement plan A, not to replace it) Only use your albuterol inhaler as a rescue medication Make sure you check your oxygen saturation  AT  your highest level of activity (not after you stop)   to be sure it stays over 90%  Suggested e-cigs as an optional  "one way bridge"  Off all tobacco products    CT chest 04/18/22  LDSCT Lung-RADS 4B, suspicious. Additional imaging evaluation or consultation with Pulmonology or Thoracic Surgery recommended. Right middle and right upper lobe pulmonary nodules, suspicious for primary bronchogenic carcinoma or carcinomas. Consider PET of the right middle lobe nodule. The right upper lobe nodule is at the low end of PET resolution. 2. No thoracic adenopathy. 3. Age advanced  coronary artery atherosclerosis. Recommend assessment of coronary risk factors. 4. Aortic atherosclerosis (ICD10-I70.0) and emphysema (ICD10-J43.9).  PET 06/02/22  1. Hypermetabolic medial right middle lobe nodule, high suspicion for malignancy. 2. The 6 by 4 mm right upper lobe nodule is not discernibly Hypermetabolic  3. Borderline enlarged right lower paratracheal lymph node has a maximum SUV of 2.4, equal to the blood pool. This tends to favor benign process.   06/06/2022  f/u ov/Vicki Blankenship/Vicki Blankenship re: GOLD 0 copd/ RML nodule  maint on nothing since had to stop symbicort 80  due to mouth irritation   Chief Complaint  Patient presents with   Follow-up    Wants to talk about pet scan done on 6/29  Dyspnea:  [employer] shopping / uses [facility] parking due to  back problems  Cough: mucus is still green in am assoc with "terrible sinuses"  Sleeping: 2lpm flat bed coughing disturbs  SABA use: not using  02: 2lpm hs  Covid status: vax 5 / never infected  Rec Doxycycline 100 mg twice daily x 10 daily  Plan A = Automatic = Always=    Bevespi 2 puffs 1st thing in am and 12 hours later Work on inhaler technique:    Plan B = Backup (to supplement plan A, not to replace it) Only use your  albuterol inhaler as a rescue medication  For cough > mucinex dm 1200 mg every hours as needed  Prednisone 10 mg take  4 each am x 2 days,   2 each am x 2 days,  1 each am x 2 days and stop   S/p robotic assisted right middle lobectomy and wedge resection of the right upper lobe nodule on 07/25/2022.  The right upper lobe nodule showed adenomatous hyperplasia.  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   12/20/2022  f/u ov/Vicki Blankenship/Vicki Blankenship re: GOLD 2 copd maint on Bevespi   Chief Complaint  Patient presents with   Follow-up    Has phlegm in throat having a hard time getting up  Dyspnea:  mailbox and back better  tol but going  slower Cough: greenish mucus / already amoxicillin > no better  Sleeping: as soon as lie down and 1st thing in am   SABA use: not using  02: 2lpm hs x " long time" / not checking sats Covid status: vax max  Mid line cp since x weeks not pleuritic, no change with different  positions/ eating coughing  Rec Doxycycline 100 mg twice daily x 10 days  - take 30 min before eating with a large glass of water  Prednisone 10 mg take  4 each am x 2 days,   2 each am x 2 days,  1 each am x 2 days and stop  Change bevespi to [place] Take 2 puffs first thing in am and then another 2 puffs about 12 hours later.   Only use your albuterol as a rescue medication to be used if you can't catch your breath  Also  Ok to try albuterol 15 min before an activity (on alternating days)  that you know would usually make you short of breath  For cough > mucinex DM 1200 mg every 12 hours to help mucus get out and use flutter valve as much as you can The key is to stop smoking completely before smoking completely stops you! Please schedule a follow up Blankenship visit in 6 weeks, call sooner if needed with all medications /inhalers/ solutions in hand     02/02/2023  f/u ov/[name]/Vicki Blankenship re: GOLD 2 copd/ MPNs/02 dep hs and prn  maint on bevespi Chief Complaint  Patient presents with   Follow-up    Had pet scan done and wants to talk about plan and tightness in throat and chest  Dyspnea:  walking a puppy named  Vicki Blankenship - walking fast past tol ok, has occ cp but no more walking than sitting with radiation to neck and R ear Cough: smoker's cough worse in am's >slt  yellow color/ same color from nose  Sleeping: bed is flat / 2 pillows  SABA use: not much  02: 2lpm hs / when cleaning also  Covid status: vax max  New midline cp x sev months/ lasts up to 30 min no more likely with activity or sitting still  Rec Zpak  Mucinex dm 1200 mg twice daily as needed for cough and use flutter valve as much as you  can Make sure you check your oxygen saturation  AT  your highest level of activity (not after you stop)   to be sure it stays over 90%  Prednisone 10 mg take  4 each am x 2 days,   2 each am x 2 days,  1 each  am x 2 days and stop Pantoprazole (protonix) 40 mg   Take  30-60 min before first meal of the day and add Pepcid (famotidine)  20 mg after supper until return to Blankenship GERD diet reviewed, bed blocks rec     03/29/2023  f/u ov/[place] Blankenship/Vicki Blankenship re: GOLD 2  on no maint rx  and still smoking  No chief complaint on file. Dyspnea:  still walking puppy 50 ft and starts coughing then sob  Cough: discolored worse in am  Sleeping: bed is flat 2 pillows  SABA use: every few hours since not on maint rx  02: 2lpm hs  and prn  Continues with cp now generalized ant and worse with coughing  Rec For cough / congestion > mucinex dm 1200 mg every 12 hours as needed and use your flutter valve as much as possible  For nasty mucus >  doxycycline 100 mg twice daily x 7 days (refillable)  Work on inhaler technique:  Plan A = Automatic = Always=   Breztri Take 2 puffs first thing in am and then another 2 puffs about 12 hours later.  Work on inhaler technique:  Plan B = Backup (to supplement plan A, not to replace it) Only use your albuterol inhaler as a rescue medication Plan C = Crisis (instead of Plan B but only if Plan B stops working) - only use your albuterol nebulizer if you first try Plan B  Plan D = Deltasone Prednisone 10 mg take  4 each am x 2 days,   2 each am x 2 days,  1 each am x 2 days and stop (refillable) Make sure you check your oxygen saturation  AT  your highest level of activity (not after you stop)   to be sure it stays over 90%  Please schedule a follow up visit in 3 months but call sooner if needed  with all RESPIRATORY  medications /inhalers/ solutions in hand    06/28/2023  f/u ov/[place] Blankenship/Vicki Blankenship re: GOLD 2 copd/ MPNs/02 dep hs and prn  maint on bevespi maint on  breztri  did not bring meds/ has stopped smoking   Chief Complaint  Patient presents with   COPD    Gold 2  Dyspnea:  still doing puppy walk s 02  Cough: worse since 2 weeks, already took pred/doxy >better but sill waking her up/ using flutter but not mucienx dm as rec  Sleeping: flat bed 2 pillows   SABA use: no increase  02: 2lpm  and rarely daytime      No obvious day to day or daytime variability or assoc e mucus plugs or hemoptysis or cp or chest tightness, subjective wheeze or overt sinus or hb symptoms.    Also denies any obvious fluctuation of symptoms with weather or environmental changes or other aggravating or alleviating factors except as outlined above   No unusual exposure hx or h/o childhood pna/ asthma or knowledge of premature birth.  Current Allergies, Complete Past Medical History, Past Surgical History, Family History, and Social History were reviewed in [employer] record.  ROS  The following are not active complaints unless bolded Hoarseness, sore throat, dysphagia, dental problems, itching, sneezing,  nasal congestion or discharge of excess mucus or purulent secretions, ear ache,   fever, chills, sweats, unintended wt loss or wt gain, classically pleuritic or exertional cp,  orthopnea pnd or arm/hand swelling  or leg swelling, presyncope, palpitations, abdominal pain, anorexia, nausea, vomiting, diarrhea  or change  in bowel habits or change in bladder habits, change in stools or change in urine, dysuria, hematuria,  rash, arthralgias/foot pain , visual complaints, headache, numbness, weakness or ataxia or problems with walking or coordination,  change in mood or  memory.        Current Meds  Medication Sig   albuterol (PROVENTIL) (2.5 MG/3ML) 0.083% nebulizer solution Take 3 mLs (2.5 mg total) by nebulization every 4 (four) hours as needed for wheezing or shortness of breath.   albuterol (VENTOLIN HFA) 108 (90 Base) MCG/ACT inhaler Inhale 1-2  puffs into the lungs every 6 (six) hours as needed for wheezing or shortness of breath.   ARIPiprazole (ABILIFY) 5 MG tablet 1/2 tablet for 1 week, then 1 tablet daily Orally Once a day for 30 days   aspirin 81 MG chewable tablet Chew 81 mg by mouth daily.   atorvastatin (LIPITOR) 40 MG tablet Take 40 mg by mouth daily.   Budeson-Glycopyrrol-Formoterol (BREZTRI AEROSPHERE) 160-9-4.8 MCG/ACT AERO Inhale 2 puffs into the lungs 2 (two) times daily.   clonazePAM (KLONOPIN) 0.5 MG tablet Take 0.5 mg by mouth 3 (three) times daily.   doxycycline (VIBRA-TABS) 100 MG tablet Take 1 tablet (100 mg total) by mouth 2 (two) times daily.   DULoxetine (CYMBALTA) 30 MG capsule Take 30 mg by mouth daily at 12 noon.   DULoxetine (CYMBALTA) 60 MG capsule Take 60 mg by mouth in the morning.   famotidine (PEPCID) 20 MG tablet One after supper   hydrochlorothiazide (HYDRODIURIL) 25 MG tablet Take 25 mg by mouth daily.   lithium carbonate 300 MG capsule Take 300 mg by mouth in the morning and at bedtime.   losartan (COZAAR) 25 MG tablet Take 25 mg by mouth daily.   metFORMIN (GLUCOPHAGE) 500 MG tablet Take 500 mg by mouth daily with breakfast.   metoprolol tartrate (LOPRESSOR) 100 MG tablet Take 1 tablet by mouth 2 hours prior to CT scan.   Multiple Vitamin (MULTIVITAMIN ADULT PO) Take 1 tablet by mouth daily.   naloxone (NARCAN) 4 MG/0.1ML LIQD nasal spray kit Place 1 spray into the nose as needed (opioid overdose).   OXYGEN Inhale 2 L into the lungs continuous.   pantoprazole (PROTONIX) 40 MG tablet Take 1 tablet (40 mg total) by mouth daily before breakfast.   predniSONE (DELTASONE) 10 MG tablet Take  4 each am x 2 days,   2 each am x 2 days,  1 each am x 2 days and stop   pregabalin (LYRICA) 75 MG capsule Take 1 capsule (75 mg total) by mouth 2 (two) times daily.   vitamin C (ASCORBIC ACID) 500 MG tablet Take 500 mg by mouth daily.   Vitamin D, Cholecalciferol, 25 MCG (1000 UT) TABS Take 1,000 Units by mouth  daily.                 Past Medical History:  Diagnosis Date   Anxiety    Arthritis    Bipolar disorder (HCC)    COPD (chronic obstructive pulmonary disease) (HCC)    Depression    Diabetes (HCC)    Type II   Diabetes mellitus, type II (HCC)    Dyspnea    GERD (gastroesophageal reflux disease)    Headache    migraines- 3 times a week   HLD (hyperlipidemia)    HTN (hypertension)    Pneumonia    PTSD (post-traumatic stress disorder)        Objective:     Wts  06/28/2023      146  02/02/2023       163  12/20/2022       159  08/16/2022       138   06/06/22 143 lb 3.2 oz (65 kg)  05/19/22 149 lb 14.6 oz (68 kg)  04/22/22 150 lb (68 kg)    Vital signs reviewed  06/28/2023  - Note at rest 02 sats  93% on RA   General appearance:    amb wf/ rattling junky sounding cough   HEENT : Oropharynx  clear       NECK :  without  apparent JVD/ palpable Nodes/TM    LUNGS: no acc muscle use,  Mild barrel  contour chest wall with bilateral  insp/exp rhonchi  and  without cough on insp or exp maneuvers  and mild  Hyperresonant  to  percussion bilaterally     CV:  RRR  no s3 or murmur or increase in P2, and no edema   ABD:  soft and nontender  MS:  Nl gait/ ext warm without deformities Or obvious joint restrictions  calf tenderness, cyanosis or clubbing     SKIN: warm and dry without lesions    NEURO:  alert, approp, nl sensorium with  no motor or cerebellar deficits apparent.          Assessment

## 2023-06-28 NOTE — Patient Instructions (Addendum)
For cough / congestion > mucinex dm 1200 mg every 12 hours as needed and use your flutter valve as much as possible and if not improving > prednisone x 6 days (refillable)   Please schedule a follow up visit in 6 months but call sooner if needed

## 2023-06-28 NOTE — Assessment & Plan Note (Signed)
Quit smoking 07/2022  - PFTs 06/04/15 no significant obstruction/ ERV 20% at wt 189 / nl dlco  - Holter 01/22/16 x 24 h  Nl but no symptoms reported during monitor - 04/18/2022  After extensive coaching inhaler device,  effectiveness =    50% > try symbicort 80 2bid due to thrush on advair previously plus approp saba > could not tolerate oral side effects of ics - 06/06/2022   > try Bevespi 2 bid and practice with empty Breztri container - PFT's  07/05/22  FEV1 1.86 (76 % ) ratio 0.65  p 7 % improvement from saba p BREO prior to study with DLCO  14.95 (75%)   and FV curve mild concavity     12/20/2022    try bevespi  - 03/29/2023    flaring with aecopd >>> change to breztri plus added doxy and pred x 6  as part of action plan  - 06/28/2023  After extensive coaching inhaler device,  effectiveness =    80% hfa > continue breztri /flutter with mucinex dm for flares   Despite stopping smoking still having flares of AB so reviewed Action plan from last ov which includes pred / doxy refillable   Emphasized should see improvement esp if maintains off cigs  F/u 6 m, sooner prn          Each maintenance medication was reviewed in detail including emphasizing most importantly the difference between maintenance and prns and under what circumstances the prns are to be triggered using an action plan format where appropriate.  Total time for H and P, chart review, counseling, reviewing hfa/neb/02/flutter  device(s) and generating customized AVS unique to this office visit / same day charting = 30 min

## 2023-07-20 IMAGING — CT CT CHEST LUNG CANCER SCREENING LOW DOSE W/O CM
2 of 5 series · 15 of 40 positions shown, 18 images · non-contrast
Comparison: 03/09/2021

CLINICAL DATA: Forty pack-year current smoker.



[Series 4: lungs · axial · 0.66mm/px · z∈[+1214,+1511]mm · 12 of 327 slices shown, 15 images]
[im 15/327  mediastinal]
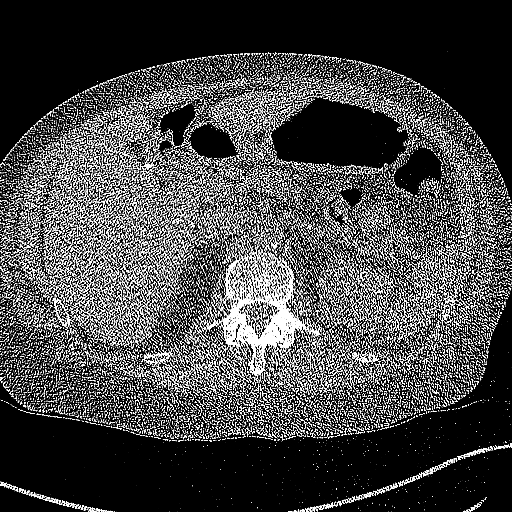
[im 15/327  lung]
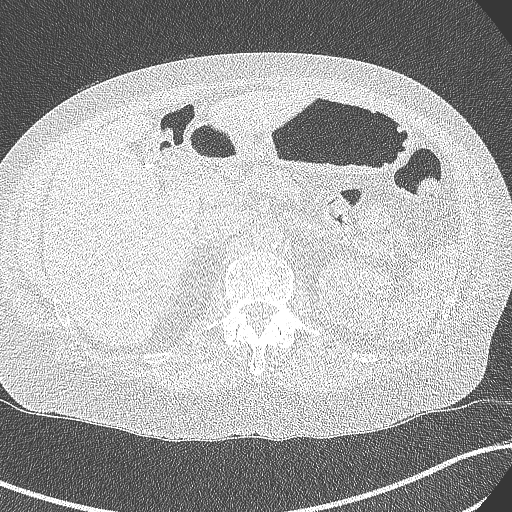
[im 45/327  lung]
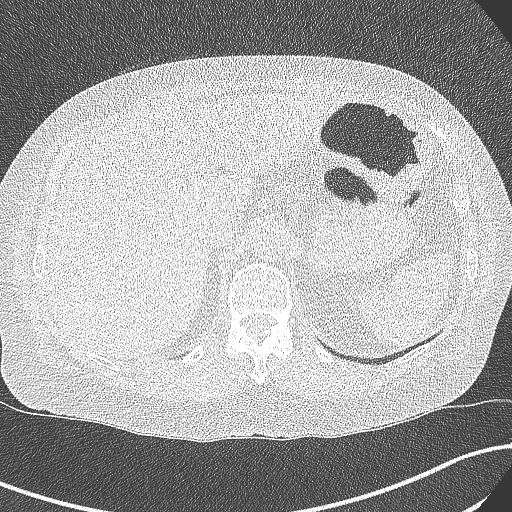
[im 75/327  lung]
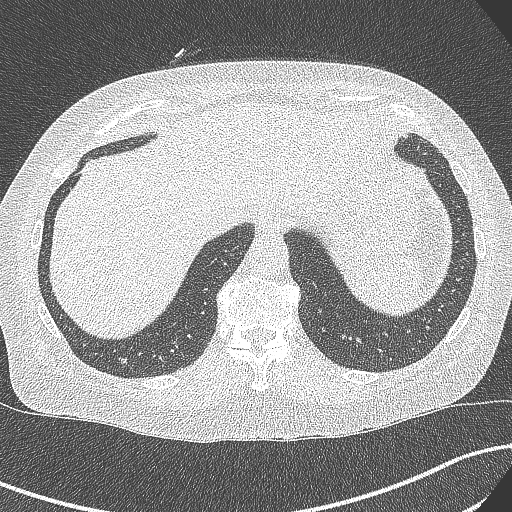
[im 104/327  lung]
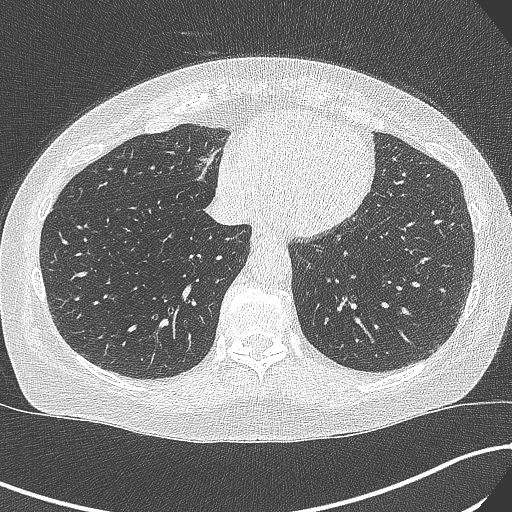
[im 119/327  mediastinal]
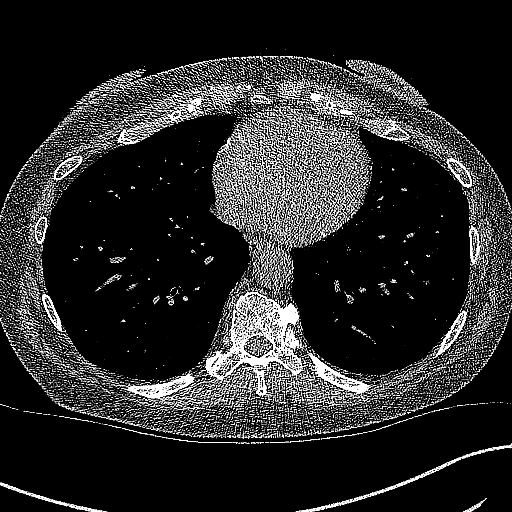
[im 119/327  lung]
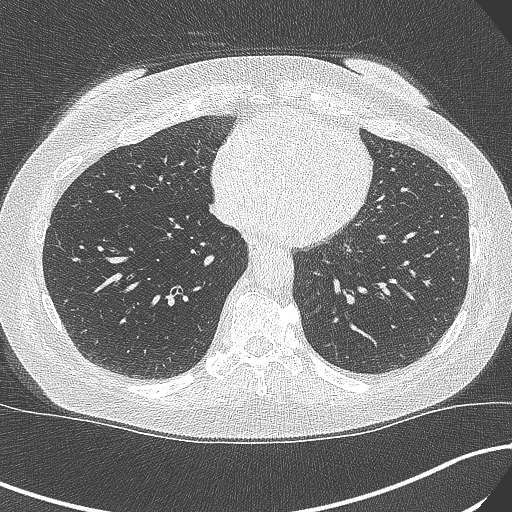
[im 149/327  lung]
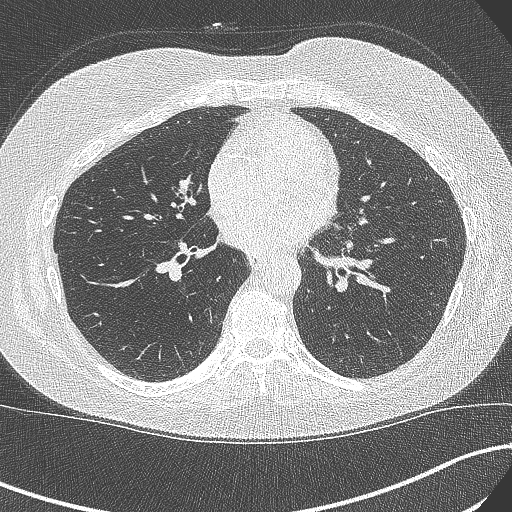
[im 178/327  lung]
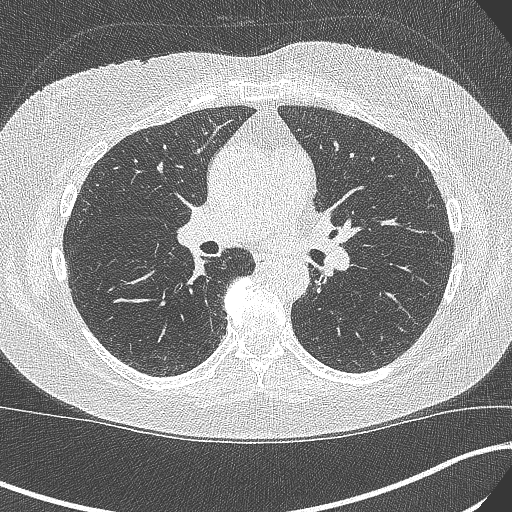
[im 208/327  lung]
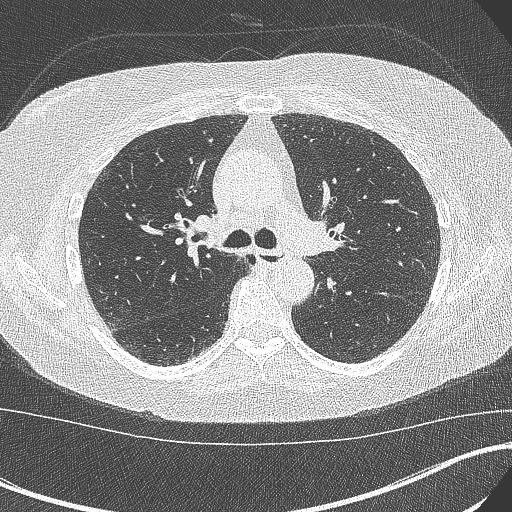
[im 223/327  mediastinal]
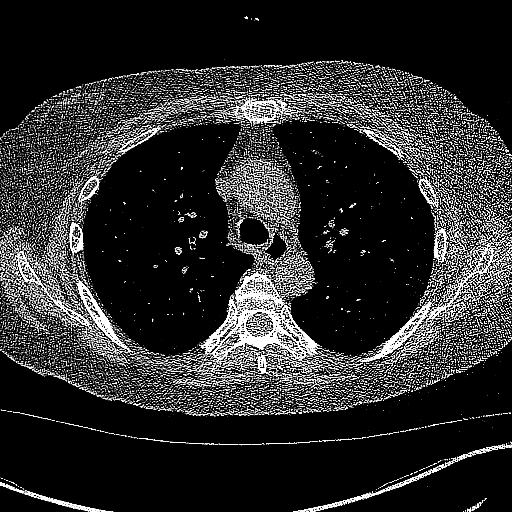
[im 223/327  lung]
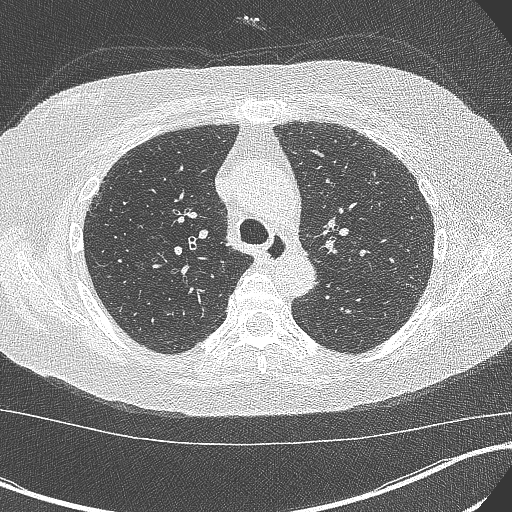
[im 252/327  lung]
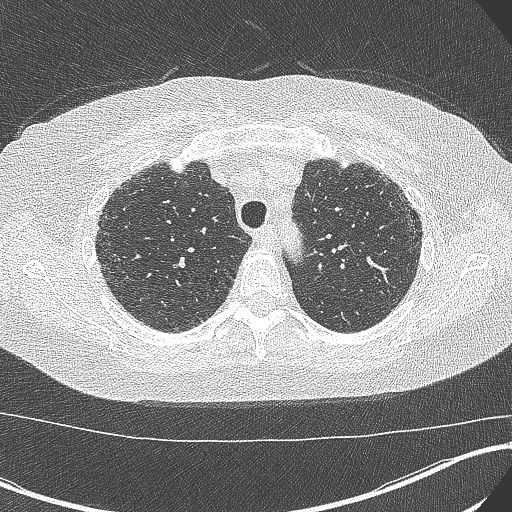
[im 282/327  lung]
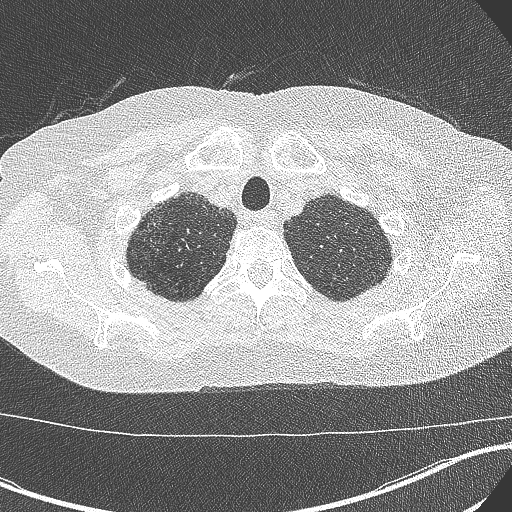
[im 312/327  lung]
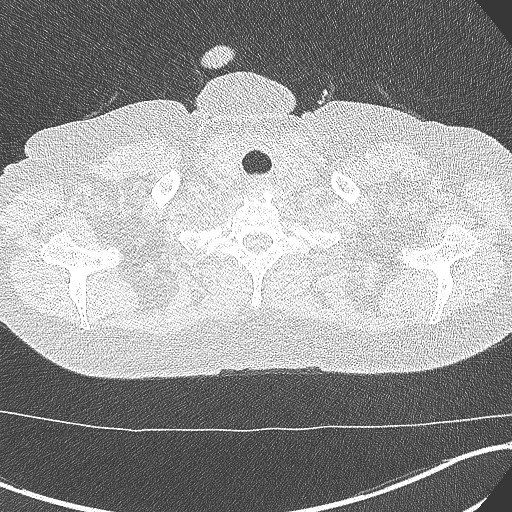

[Series 5: coronal · coronal · 0.67mm/px · 3 of 298 slices shown]
[im 60/298  lung]
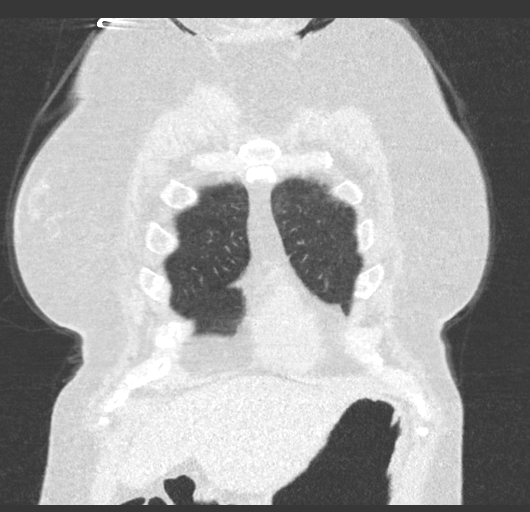
[im 119/298  lung]
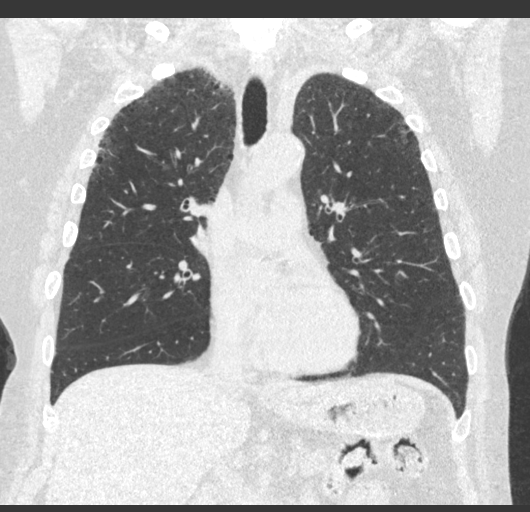
[im 179/298  lung]
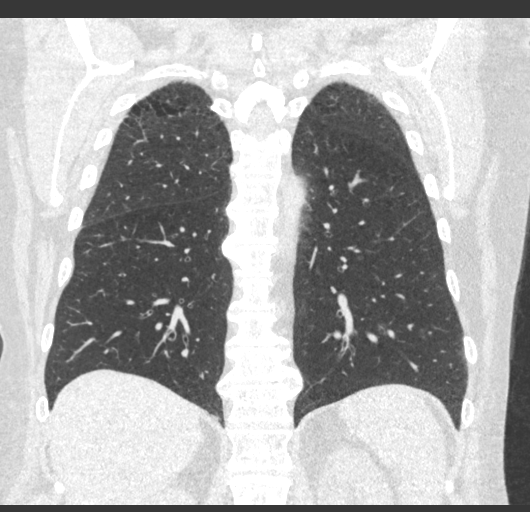

[15 of 40 positions shown; findings below may reference images not displayed]

FINDINGS: Cardiovascular: Aortic atherosclerosis. Normal heart size, without
pericardial effusion. Left main, LAD, right coronary artery
calcification.

Mediastinum/Nodes: No mediastinal or definite hilar adenopathy,
given limitations of unenhanced CT.

Lungs/Pleura: No pleural fluid. Moderate centrilobular and
paraseptal emphysema.

A right middle lobe pulmonary nodule of volume derived equivalent
diameter 12.0 mm is new or enlarged on 219/4.

A right upper lobe pulmonary nodule of volume derived equivalent
diameter 6.8 mm on 89/4 has enlarged from volume derived equivalent
diameter 4.4 mm on the prior.

Other pulmonary nodules are similar at maximally volume derived
equivalent diameter 5.6 mm.

Upper Abdomen: Cholecystectomy. Normal imaged portions of the liver,
spleen, stomach, pancreas, adrenal glands, right kidney. Upper pole
left renal 1.6 cm hypoattenuating lesion is favored to represent a
cyst or minimally complex cyst, but is incompletely imaged.

The common duct measures 1.3 cm on 66/2. This was excluded on the
prior exam.

Musculoskeletal: Lower cervical and midthoracic spondylosis.
IMPRESSION: 1. Lung-RADS 4B, suspicious. Additional imaging evaluation or
consultation with Pulmonology or Thoracic Surgery recommended. Right
middle and right upper lobe pulmonary nodules, suspicious for
primary bronchogenic carcinoma or carcinomas. Consider PET of the
right middle lobe nodule. The right upper lobe nodule is at the low
end of PET resolution.
2. No thoracic adenopathy.
3. Age advanced coronary artery atherosclerosis. Recommend
assessment of coronary risk factors.
4. Aortic atherosclerosis (6KWUK-RBX.X) and emphysema (6KWUK-XYV.H).
5. Common duct dilatation after cholecystectomy, of indeterminate
acuity. Consider correlation with bilirubin levels. If elevated,
consider MRCP.

These results will be called to the ordering clinician or
representative by the Radiologist Assistant, and communication
documented in the PACS or [REDACTED].

## 2023-07-24 ENCOUNTER — Ambulatory Visit: Payer: Medicare HMO | Admitting: Podiatry

## 2023-08-14 ENCOUNTER — Encounter: Payer: Self-pay | Admitting: Podiatry

## 2023-08-14 ENCOUNTER — Ambulatory Visit: Payer: Medicare HMO | Admitting: Podiatry

## 2023-08-14 ENCOUNTER — Ambulatory Visit (INDEPENDENT_AMBULATORY_CARE_PROVIDER_SITE_OTHER): Payer: Medicare HMO

## 2023-08-14 DIAGNOSIS — M778 Other enthesopathies, not elsewhere classified: Secondary | ICD-10-CM

## 2023-08-14 DIAGNOSIS — M79674 Pain in right toe(s): Secondary | ICD-10-CM

## 2023-08-14 DIAGNOSIS — M79675 Pain in left toe(s): Secondary | ICD-10-CM

## 2023-08-14 DIAGNOSIS — B351 Tinea unguium: Secondary | ICD-10-CM

## 2023-08-14 MED ORDER — TRIAMCINOLONE ACETONIDE 10 MG/ML IJ SUSP
10.0000 mg | Freq: Once | INTRAMUSCULAR | Status: AC
Start: 2023-08-14 — End: 2023-08-14
  Administered 2023-08-14: 10 mg via INTRA_ARTICULAR

## 2023-08-14 NOTE — Progress Notes (Signed)
Subjective:   Patient ID: Vicki Blankenship, female   DOB: 67 y.o.   MRN: [id number]   HPI Patient presents with a lot of pain on the outside of both feet and states that the right is worse than the left and also has severely elongated nailbeds 1-5 both feet.  Has diabetes under excellent control well taken care of and does not smoke likes to be active   Review of Systems  All other systems reviewed and are negative.       Objective:  Physical Exam Vitals and nursing note reviewed.  Constitutional:      Appearance: She is well-developed.  Pulmonary:     Effort: Pulmonary effort is normal.  Musculoskeletal:        General: Normal range of motion.  Skin:    General: Skin is warm.  Neurological:     Mental Status: She is alert.     Neurovascular status found to be intact muscle strength was found to be adequate range of motion adequate significant keratotic lesion formation fifth metatarsal head right over the left fluid buildup underneath the area with pain with pressure.  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  For the right I went ahead and I injected the capsule 3 mg dexamethasone Kenalog and for the left the same debrided the lesions fully and then went ahead today and debrided nailbeds 1-5 both feet no iatrogenic bleeding anywhere.  I discussed the possibility for surgical correction of the metatarsals with fifth met head resection and reviewed that this may be necessary depending on response to conservative treatment  X-rays indicate that there is probable pressure around the fifth metatarsal heads no other pathology noted

## 2023-09-20 ENCOUNTER — Encounter: Payer: Self-pay | Admitting: Internal Medicine

## 2023-09-20 ENCOUNTER — Ambulatory Visit: Payer: Medicare HMO | Attending: Internal Medicine | Admitting: Internal Medicine

## 2023-09-20 VITALS — BP 130/70 | HR 68 | Ht 64.0 in | Wt 140.0 lb

## 2023-09-20 DIAGNOSIS — I10 Essential (primary) hypertension: Secondary | ICD-10-CM | POA: Diagnosis not present

## 2023-09-20 DIAGNOSIS — I2510 Atherosclerotic heart disease of native coronary artery without angina pectoris: Secondary | ICD-10-CM | POA: Diagnosis not present

## 2023-09-20 DIAGNOSIS — I739 Peripheral vascular disease, unspecified: Secondary | ICD-10-CM | POA: Diagnosis not present

## 2023-09-20 NOTE — Patient Instructions (Signed)
Medication Instructions:  Your physician recommends that you continue on your current medications as directed. Please refer to the Current Medication list given to you today.  *If you need a refill on your cardiac medications before your next appointment, please call your pharmacy*   Lab Work: None If you have labs (blood work) drawn today and your tests are completely normal, you will receive your results only by: MyChart Message (if you have MyChart) OR A paper copy in the mail If you have any lab test that is abnormal or we need to change your treatment, we will call you to review the results.   Testing/Procedures: None   Follow-Up: At [facility], you and your health needs are our priority.  As part of our continuing mission to provide you with exceptional heart care, we have created designated Provider Care Teams.  These Care Teams include your primary Cardiologist (physician) and Advanced Practice Providers (APPs -  Physician Assistants and Nurse Practitioners) who all work together to provide you with the care you need, when you need it.  We recommend signing up for the patient portal called "MyChart".  Sign up information is provided on this After Visit Summary.  MyChart is used to connect with patients for Virtual Visits (Telemedicine).  Patients are able to view lab/test results, encounter notes, upcoming appointments, etc.  Non-urgent messages can be sent to your provider as well.   To learn more about what you can do with MyChart, go to [email or web address].    Your next appointment:   1 year(s)  Provider:   Vishnu Mallipeddi, MD    Other Instructions    

## 2023-09-20 NOTE — Progress Notes (Signed)
Cardiology Office Note  Date: 09/20/2023   ID: Vicki Blankenship, DOB Feb 19, 1956, MRN [id number]  PCP:  Karl Bales, NP  Cardiologist:  Marjo Bicker, MD Electrophysiologist:  None    History of Present Illness: Vicki Blankenship is a 67 y.o. female known to have HTN, DM 2, lung lobectomy gold 2 COPD, nicotine abuse is here for follow-up visit.  Patient was initially referred to cardiology clinic for evaluation of chest pains. She was having chest pain with coughing, taking a deep breath, with stress. No relation with rest or exertion.  She moved from [place] to [place], underwent stress test and LHC in [place] around 10 years ago and was told she had minor blockages. 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  ABI bilateral lower extremities were within normal limits but at the segmental exam of the right ankle demonstrated evidence of developing tibial disease in the anterior tibial artery distribution.  Patient is here for follow-up visit.  She continues to have chest pains with deep breath, coughing etc.  No chest pain with exertion.  After her pulmonologist started her on steroids, her pleural chest pains significantly improved.  No DOE, orthopnea, PND, leg swelling, dizziness, presyncope, syncope.  Quit smoking cigarettes.  Past Medical History:  Diagnosis Date   Anemia    Anxiety    Aortic atherosclerosis (HCC)    Arthritis    Bipolar disorder (HCC)    Chronic bronchitis with COPD (chronic obstructive pulmonary disease) (HCC)    COPD (chronic obstructive pulmonary disease) (HCC)    Depression    Diabetes (HCC)    Type II   Diabetes mellitus, type II (HCC)    Dyspnea    GERD (gastroesophageal reflux disease)    Head injury    Headache    migraines- 3 times a week   HLD (hyperlipidemia)    HTN (hypertension)    Lesion of lung 2023    suspect lung cancer per pt   LOC (loss of consciousness) (HCC)    Meningioma (HCC)    L frontal   Pneumonia    PTSD (post-traumatic stress disorder)    Seizure (HCC)    hx of    Past Surgical History:  Procedure Laterality Date   BIOPSY  05/23/2022   Procedure: BIOPSY;  Surgeon: Lanelle Bal, DO;  [facility]: [facility];  Service: Endoscopy;;   CARPAL TUNNEL RELEASE Right    CHOLECYSTECTOMY  2013   COLONOSCOPY  2015   In [place].  Per patient, she had colon polyps.   COLONOSCOPY WITH PROPOFOL N/A 05/23/2022   Procedure: COLONOSCOPY WITH PROPOFOL;  Surgeon: Lanelle Bal, DO;  [facility];  Service: Endoscopy;  Laterality: N/A;  11:00am   ESOPHAGOGASTRODUODENOSCOPY (EGD) WITH PROPOFOL N/A 05/23/2022   Procedure: ESOPHAGOGASTRODUODENOSCOPY (EGD) WITH PROPOFOL;  Surgeon: Lanelle Bal, DO;  [facility];  Service: Endoscopy;  Laterality: N/A;   FOOT SURGERY Left    INTERCOSTAL NERVE BLOCK Right 07/25/2022   Procedure: INTERCOSTAL NERVE BLOCK;  Surgeon: Loreli Slot, MD;  Location[facility];  Service: Thoracic;  Laterality: Right;   LUMBAR LAMINECTOMY/ DECOMPRESSION WITH MET-RX Left 04/14/2020   Procedure: Left Lumbar Four-Five Minimally invasive lumbar decompression;  Surgeon: Jadene Pierini, MD;  Location: [facility] OR;  Service: Neurosurgery;  Laterality: Left;  Left Lumbar Four-Five  Minimally invasive lumbar decompression   LYMPH NODE DISSECTION Right 07/25/2022   Procedure: LYMPH NODE DISSECTION;  Surgeon: Loreli Slot, MD;  Location: [facility] OR;  Service: Thoracic;  Laterality: Right;   POLYPECTOMY  05/23/2022   Procedure: POLYPECTOMY;  Surgeon: Lanelle Bal, DO;  [facility];  Service: Endoscopy;;   thumb surgery Left    pin placed   TUBAL LIGATION     VIDEO BRONCHOSCOPY WITH ENDOBRONCHIAL NAVIGATION N/A 07/25/2022   Procedure: VIDEO BRONCHOSCOPY WITH ENDOBRONCHIAL NAVIGATION;  Surgeon: Loreli Slot, MD;   Location: [facility] OR;  Service: Thoracic;  Laterality: N/A;    Current Outpatient Medications  Medication Sig Dispense Refill   albuterol (PROVENTIL) (2.5 MG/3ML) 0.083% nebulizer solution Take 3 mLs (2.5 mg total) by nebulization every 4 (four) hours as needed for wheezing or shortness of breath. 75 mL 12   albuterol (VENTOLIN HFA) 108 (90 Base) MCG/ACT inhaler Inhale 1-2 puffs into the lungs every 6 (six) hours as needed for wheezing or shortness of breath. 8 g 3   aspirin 81 MG chewable tablet Chew 81 mg by mouth daily.     atorvastatin (LIPITOR) 40 MG tablet Take 40 mg by mouth daily.     Budeson-Glycopyrrol-Formoterol (BREZTRI AEROSPHERE) 160-9-4.8 MCG/ACT AERO Inhale 2 puffs into the lungs 2 (two) times daily. 10.7 g 11   clonazePAM (KLONOPIN) 0.5 MG tablet Take 0.5 mg by mouth 3 (three) times daily.     doxycycline (VIBRA-TABS) 100 MG tablet Take 1 tablet (100 mg total) by mouth 2 (two) times daily. 14 tablet 11   DULoxetine (CYMBALTA) 30 MG capsule Take 30 mg by mouth daily at 12 noon.     DULoxetine (CYMBALTA) 60 MG capsule Take 60 mg by mouth in the morning.     famotidine (PEPCID) 20 MG tablet One after supper 30 tablet 11   hydrochlorothiazide (HYDRODIURIL) 25 MG tablet Take 25 mg by mouth daily.     lithium carbonate 300 MG capsule Take 300 mg by mouth in the morning and at bedtime.     losartan (COZAAR) 25 MG tablet Take 25 mg by mouth daily.     metFORMIN (GLUCOPHAGE) 500 MG tablet Take 500 mg by mouth daily with breakfast.     mirtazapine (REMERON) 7.5 MG tablet Take 7.5 mg by mouth at bedtime.     Multiple Vitamin (MULTIVITAMIN ADULT PO) Take 1 tablet by mouth daily.     naloxone (NARCAN) 4 MG/0.1ML LIQD nasal spray kit Place 1 spray into the nose as needed (opioid overdose).     OXYGEN Inhale 2 L into the lungs continuous.     pantoprazole (PROTONIX) 40 MG tablet Take 1 tablet (40 mg total) by mouth daily before breakfast. 90 tablet 3   predniSONE (DELTASONE) 10 MG tablet Take  4  each am x 2 days,   2 each am x 2 days,  1 each am x 2 days and stop 14 tablet 11   pregabalin (LYRICA) 75 MG capsule Take 1 capsule (75 mg total) by mouth 2 (two) times daily. 60 capsule 5   vitamin C (ASCORBIC ACID) 500 MG tablet Take 500 mg by mouth daily.     Vitamin D, Cholecalciferol, 25 MCG (1000 UT) TABS Take 1,000 Units by mouth daily.     ARIPiprazole (ABILIFY) 5 MG tablet 1/2 tablet for 1 week, then 1 tablet daily Orally Once a day for 30 days (Patient not taking: Reported on 09/20/2023)     No current  facility-administered medications for this visit.   Allergies:  Penicillins and Wellbutrin [bupropion]   Social History: The patient  reports that she quit smoking about 14 months ago. Her smoking use included cigarettes. She started smoking about 41 years ago. She has a 20 pack-year smoking history. She has never used smokeless tobacco. She reports that she does not currently use drugs. She reports that she does not drink alcohol.   Family History: The patient's family history includes Cancer in her father; Dementia in her mother; Diabetes in her mother; Heart disease in her father; Migraines in her mother.   ROS:  Please see the history of present illness. Otherwise, complete review of systems is positive for none.  All other systems are reviewed and negative.   Physical Exam: VS:  Ht 5\' 4"  (1.626 m)   Wt 140 lb (63.5 kg)   BMI 24.03 kg/m , BMI Body mass index is 24.03 kg/m.  Wt Readings from Last 3 Encounters:  09/20/23 140 lb (63.5 kg)  06/28/23 146 lb (66.2 kg)  05/30/23 148 lb 4.8 oz (67.3 kg)    General: Patient appears comfortable at rest. HEENT: Conjunctiva and lids normal, oropharynx clear with moist mucosa. Neck: Supple, no elevated JVP or carotid bruits, no thyromegaly. Lungs: Clear to auscultation, nonlabored breathing at rest. Cardiac: Regular rate and rhythm, no S3 or significant systolic murmur, no pericardial rub. Abdomen: Soft, nontender, no hepatomegaly,  bowel sounds present, no guarding or rebound. Extremities: No pitting edema, distal pulses 2+. Skin: Warm and dry. Musculoskeletal: No kyphosis. Neuropsychiatric: Alert and oriented x3, affect grossly appropriate.  ECG:  NSR  Recent Labwork: 05/23/2023: ALT 40; AST 36; BUN 10; Creatinine, Ser 0.72; Hemoglobin 14.9; Platelets 304; Potassium 3.5; Sodium 135     Component Value Date/Time   CHOL 141 03/01/2022 0000   TRIG 144 03/01/2022 0000   HDL 51 03/01/2022 0000   LDLCALC 67 03/01/2022 0000     Assessment and Plan: Patient is a 67 year old F known to have HTN, DM 2, COPD, lung lobectomy, nicotine abuse, is here for follow-up visit.   Nonobstructive CAD: CT cardiac from 4/24 showed mild nonobstructive CAD, coronary calcium score of 782 (98 percentile for age and sex matched control). Continue aspirin 81 mg once daily, atorvastatin 40 mg at bedtime.  She continues to have pleuritic chest pains that significantly improved after starting steroids, will follow up with pulmonologist.  PAD: Resting ABI of bilateral lower extremities was within normal limits in 4/24 but the segmental exam of the right ankle demonstrated evidence of developing tibial disease in the anterior tibial artery distribution.  No claudication.  Continue cardioprotective medications as stated above.  Encourage smoking cessation.  HLD, at goal: LDL 67 around 1 year ago.  Continue atorvastatin 40 mg nightly, goal LDL less than 70.  HTN, controlled: Continue HCTZ 25 mg once daily and losartan 25 mg once daily.  Nicotine abuse: Former smoker, quit smoking a few months ago.   I have spent a total duration of 30 minutes reviewing medications, labs, imaging studies, face-to-face counseling discussing CT cardiac and ABI results, answering questions, and documenting findings in the note.     Disposition:  Follow up  1 year  Signed, Renell Allum Verne Spurr, MD, 09/20/2023 9:09 AM    [facility]  at [facility] [address], [place], [place] [address]

## 2023-10-27 ENCOUNTER — Other Ambulatory Visit: Payer: Self-pay | Admitting: Hematology

## 2023-11-27 ENCOUNTER — Ambulatory Visit (HOSPITAL_COMMUNITY)
Admission: RE | Admit: 2023-11-27 | Discharge: 2023-11-27 | Disposition: A | Discharge status: Home / Self Care | Payer: Medicare HMO | Source: Ambulatory Visit | Attending: Hematology | Admitting: Hematology

## 2023-11-27 ENCOUNTER — Inpatient Hospital Stay: Payer: Medicare HMO | Attending: Hematology

## 2023-11-27 ENCOUNTER — Encounter (HOSPITAL_COMMUNITY): Payer: Self-pay | Admitting: Radiology

## 2023-11-27 DIAGNOSIS — Z808 Family history of malignant neoplasm of other organs or systems: Secondary | ICD-10-CM | POA: Insufficient documentation

## 2023-11-27 DIAGNOSIS — Z85118 Personal history of other malignant neoplasm of bronchus and lung: Secondary | ICD-10-CM | POA: Diagnosis present

## 2023-11-27 DIAGNOSIS — Z79899 Other long term (current) drug therapy: Secondary | ICD-10-CM | POA: Diagnosis not present

## 2023-11-27 DIAGNOSIS — Z8042 Family history of malignant neoplasm of prostate: Secondary | ICD-10-CM | POA: Diagnosis not present

## 2023-11-27 DIAGNOSIS — C3491 Malignant neoplasm of unspecified part of right bronchus or lung: Secondary | ICD-10-CM | POA: Insufficient documentation

## 2023-11-27 DIAGNOSIS — Z87891 Personal history of nicotine dependence: Secondary | ICD-10-CM | POA: Insufficient documentation

## 2023-11-27 DIAGNOSIS — G629 Polyneuropathy, unspecified: Secondary | ICD-10-CM | POA: Diagnosis not present

## 2023-11-27 DIAGNOSIS — Z902 Acquired absence of lung [part of]: Secondary | ICD-10-CM | POA: Diagnosis not present

## 2023-11-27 DIAGNOSIS — Z8052 Family history of malignant neoplasm of bladder: Secondary | ICD-10-CM | POA: Insufficient documentation

## 2023-11-27 DIAGNOSIS — Z803 Family history of malignant neoplasm of breast: Secondary | ICD-10-CM | POA: Diagnosis not present

## 2023-11-27 LAB — CBC WITH DIFFERENTIAL/PLATELET
Abs Immature Granulocytes: 0.04 10*3/uL (ref 0.00–0.07)
Basophils Absolute: 0.1 10*3/uL (ref 0.0–0.1)
Basophils Relative: 1 %
Eosinophils Absolute: 0.3 10*3/uL (ref 0.0–0.5)
Eosinophils Relative: 3 %
HCT: 48.5 % — ABNORMAL HIGH (ref 36.0–46.0)
Hemoglobin: 14.7 g/dL (ref 12.0–15.0)
Immature Granulocytes: 0 %
Lymphocytes Relative: 14 %
Lymphs Abs: 1.4 10*3/uL (ref 0.7–4.0)
MCH: 30.2 pg (ref 26.0–34.0)
MCHC: 30.3 g/dL (ref 30.0–36.0)
MCV: 99.8 fL (ref 80.0–100.0)
Monocytes Absolute: 0.7 10*3/uL (ref 0.1–1.0)
Monocytes Relative: 7 %
Neutro Abs: 7.3 10*3/uL (ref 1.7–7.7)
Neutrophils Relative %: 75 %
Platelets: 327 10*3/uL (ref 150–400)
RBC: 4.86 MIL/uL (ref 3.87–5.11)
RDW: 12.5 % (ref 11.5–15.5)
WBC: 9.7 10*3/uL (ref 4.0–10.5)
nRBC: 0 % (ref 0.0–0.2)

## 2023-11-27 LAB — COMPREHENSIVE METABOLIC PANEL
ALT: 42 U/L (ref 0–44)
AST: 37 U/L (ref 15–41)
Albumin: 3.8 g/dL (ref 3.5–5.0)
Alkaline Phosphatase: 90 U/L (ref 38–126)
Anion gap: 6 (ref 5–15)
BUN: 8 mg/dL (ref 8–23)
CO2: 32 mmol/L (ref 22–32)
Calcium: 10.6 mg/dL — ABNORMAL HIGH (ref 8.9–10.3)
Chloride: 101 mmol/L (ref 98–111)
Creatinine, Ser: 0.62 mg/dL (ref 0.44–1.00)
GFR, Estimated: >60 mL/min (ref >60)
Glucose, Bld: 110 mg/dL — ABNORMAL HIGH (ref 70–99)
Potassium: 4.1 mmol/L (ref 3.5–5.1)
Sodium: 139 mmol/L (ref 135–145)
Total Bilirubin: 0.8 mg/dL (ref <1.2)
Total Protein: 8 g/dL (ref 6.5–8.1)

## 2023-11-27 LAB — VITAMIN D 25 HYDROXY (VIT D DEFICIENCY, FRACTURES): Vit D, 25-Hydroxy: 55.84 ng/mL (ref 30–100)

## 2023-11-27 MED ORDER — IOHEXOL 300 MG/ML  SOLN
75.0000 mL | Freq: Once | INTRAMUSCULAR | Status: AC | PRN
  Administered 2023-11-27: 75 mL via INTRAVENOUS

## 2023-12-04 ENCOUNTER — Inpatient Hospital Stay: Payer: Medicare HMO | Admitting: Hematology

## 2023-12-04 VITALS — BP 153/83 | HR 72 | Temp 98.4°F | Resp 19 | Wt 142.3 lb

## 2023-12-04 DIAGNOSIS — Z85118 Personal history of other malignant neoplasm of bronchus and lung: Secondary | ICD-10-CM | POA: Diagnosis not present

## 2023-12-04 DIAGNOSIS — C3491 Malignant neoplasm of unspecified part of right bronchus or lung: Secondary | ICD-10-CM | POA: Diagnosis not present

## 2023-12-04 MED ORDER — AMOXICILLIN-POT CLAVULANATE 875-125 MG PO TABS: 1.0000 | ORAL_TABLET | Freq: Two times a day (BID) | ORAL | 0 refills | Status: DC

## 2023-12-04 NOTE — Patient Instructions (Addendum)
[facility] at [facility] Discharge Instructions   You were seen and examined today by Dr. Ellin Saba.  He reviewed the results of your lab work which are normal/stable.   He reviewed the results of your CT scan. There are some lymph nodes present that weren't on the last scan. Dr. Kirtland Bouchard is not concerned about this. We will just continue to monitor.   We will see you back in 4 months. We will repeat a CT scan prior this visit.   Return as scheduled.    Thank you for choosing [facility] at [facility] to provide your oncology and hematology care.  To afford each patient quality time with our provider, please arrive at least 15 minutes before your scheduled appointment time.   If you have a lab appointment with the Cancer Center please come in thru the Main Entrance and check in at the main information desk.  You need to re-schedule your appointment should you arrive 10 or more minutes late.  We strive to give you quality time with our providers, and arriving late affects you and other patients whose appointments are after yours.  Also, if you no show three or more times for appointments you may be dismissed from the clinic at the providers discretion.     Again, thank you for choosing [facility].  Our hope is that these requests will decrease the amount of time that you wait before being seen by our physicians.       _____________________________________________________________  Should you have questions after your visit to [facility], please contact our office at [phone] and follow the prompts.  Our office hours are 8:00 a.m. and 4:30 p.m. Monday - Friday.  Please note that voicemails left after 4:00 p.m. may not be returned until the following business day.  We are closed weekends and major holidays.  You do have access to a nurse 24-7, just call the main number to the clinic [phone] and do not press any  options, hold on the line and a nurse will answer the phone.    For prescription refill requests, have your pharmacy contact our office and allow 72 hours.    Due to Covid, you will need to wear a mask upon entering the hospital. If you do not have a mask, a mask will be given to you at the Main Entrance upon arrival. For doctor visits, patients may have 1 support person age 65 or older with them. For treatment visits, patients can not have anyone with them due to social distancing guidelines and our immunocompromised population.

## 2023-12-04 NOTE — Progress Notes (Signed)
[facility] [address], [place] [address]    Clinic Day:  12/04/2023  Referring physician: Karl Bales, NP  Patient Care Team: Nira Retort, FNP as PCP - General (Family Medicine) Marjo Bicker, MD as PCP - Cardiology (Cardiology) Jena Gauss Gerrit Friends, MD as Consulting Physician (Gastroenterology) Doreatha Massed, MD as Medical Oncologist (Medical Oncology) Therese Sarah, RN as Oncology Nurse Navigator (Medical Oncology)   ASSESSMENT & PLAN:   Assessment: 1.  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 - PET (06/02/2022): Right middle lobe nodule 1.3 x 0.7 cm, SUV 4.8.  6 x 4 mm right upper lobe subpleural nodule with no activity.  1 cm short axis lower paratracheal node, SUV 2.4. - Right Middle lobectomy, lymph node biopsy, right upper lobe wedge resection on 07/25/2022 - Pathology: 1.5 x 1.1 x 1 cm squamous cell carcinoma, margins negative, negative visceral pleural involvement, lymph node stations 4R, 7, 9, 11, 12 negative for metastatic disease.  IHC positive for CK5/6, p63 and negative for TTF-1.  Ki-67 shows increased proliferation.   2.  Social/family history: - She lives at home with her husband.  Quit smoking on 07/24/2022.  She uses oxygen 2 L at bedtime.  Smoked 1 pack/day for 50 years.  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 - Father died of metastatic bladder cancer.  Mother had breast cancer.  2 paternal uncles had brain cancer and prostate cancer.    Plan: 1.  Stage I (T1b N0 M0) squamous cell carcinoma of the right middle lobe of the lung: - She does not report any recent lung infections.  However she has cold which started few days ago.  She is coughing up greenish-yellow sputum. - Reviewed labs from 11/27/2023: Mild hypercalcemia at 10.6 stable.  She is on vitamin D supplements.   Vitamin D level is 55.  CBC was grossly normal. - CT chest with contrast on 11/27/2023: Pretracheal lymph node measures 13 mm, previously 11 mm.  Small prevascular and AP window lymph nodes have also mildly increased but not enlarged by CT size criteria.  No hilar lymphadenopathy. - I have compared her CT scan from previous scan from June as well as scan from December last year.  She has a significant adenopathy in December last year, which was not seen on PET scan.  I think she has waxing and waning benign lymph nodes in the chest.  I have recommended CT scan in 4 months.  If they are stable at that time, we will switch her back to every 6 months. - I will also give her Augmentin twice daily for 7 days.   2.  Peripheral neuropathy (feet worse than hands): - Continue Lyrica 75 mg twice daily.    No orders of the defined types were placed in this encounter.     Doreatha Massed, MD   12/30/20242:47 PM  CHIEF COMPLAINT:   Diagnosis: stage I right lung squamous cell carcinoma    Cancer Staging  Squamous cell lung cancer, right (HCC) Staging form: Lung, AJCC 8th Edition - Clinical stage from 08/29/2022: Stage IA2 (cT1b, cN0, cM0) - Signed by Doreatha Massed, MD on 08/29/2022    Prior Therapy: Right middle lobectomy, lymph node biopsy, right upper lobe wedge resection on 07/25/2022   Current Therapy:  Surveillance    HISTORY OF PRESENT  ILLNESS:   Oncology History  Squamous cell lung cancer, right (HCC)  08/29/2022 Initial Diagnosis   Squamous cell lung cancer, right (HCC)   08/29/2022 Cancer Staging   Staging form: Lung, AJCC 8th Edition - Clinical stage from 08/29/2022: Stage IA2 (cT1b, cN0, cM0) - Signed by Doreatha Massed, MD on 08/29/2022 Histopathologic type: Squamous cell carcinoma, keratinizing, NOS Stage prefix: Initial diagnosis      INTERVAL HISTORY:   Vicki Blankenship is a 67 y.o. female seen for follow-up of her stage I right lung squamous cell carcinoma.  She  reported having cold for the last few days and is coughing up greenish-yellow sputum.  Appetite is 50%.  Energy levels are low at 10%.  PAST MEDICAL HISTORY:   Past Medical History: Past Medical History:  Diagnosis Date   Anemia    Anxiety    Aortic atherosclerosis (HCC)    Arthritis    Bipolar disorder (HCC)    Chronic bronchitis with COPD (chronic obstructive pulmonary disease) (HCC)    COPD (chronic obstructive pulmonary disease) (HCC)    Depression    Diabetes (HCC)    Type II   Diabetes mellitus, type II (HCC)    Dyspnea    GERD (gastroesophageal reflux disease)    Head injury    Headache    migraines- 3 times a week   HLD (hyperlipidemia)    HTN (hypertension)    Lesion of lung 2023   suspect lung cancer per pt   LOC (loss of consciousness) (HCC)    Meningioma (HCC)    L frontal   Pneumonia    PTSD (post-traumatic stress disorder)    Seizure (HCC)    hx of    Surgical History: Past Surgical History:  Procedure Laterality Date   BIOPSY  05/23/2022   Procedure: BIOPSY;  Surgeon: Lanelle Bal, DO;  Location: [facility];  Service: Endoscopy;;   CARPAL TUNNEL RELEASE Right    CHOLECYSTECTOMY  2013   COLONOSCOPY  2015   In [place].  Per patient, she had colon polyps.   COLONOSCOPY WITH PROPOFOL N/A 05/23/2022   Procedure: COLONOSCOPY WITH PROPOFOL;  Surgeon: Lanelle Bal, DO;  Location: [facility];  Service: Endoscopy;  Laterality: N/A;  11:00am   ESOPHAGOGASTRODUODENOSCOPY (EGD) WITH PROPOFOL N/A 05/23/2022   Procedure: ESOPHAGOGASTRODUODENOSCOPY (EGD) WITH PROPOFOL;  Surgeon: Lanelle Bal, DO;  Location: [facility];  Service: Endoscopy;  Laterality: N/A;   FOOT SURGERY Left    INTERCOSTAL NERVE BLOCK Right 07/25/2022   Procedure: INTERCOSTAL NERVE BLOCK;  Surgeon: Loreli Slot, MD;  Location: [facility] OR;  Service: Thoracic;  Laterality: Right;   LUMBAR LAMINECTOMY/ DECOMPRESSION WITH MET-RX Left 04/14/2020   Procedure: Left Lumbar  Four-Five Minimally invasive lumbar decompression;  Surgeon: Jadene Pierini, MD;  Location: [facility] OR;  Service: Neurosurgery;  Laterality: Left;  Left Lumbar Four-Five Minimally invasive lumbar decompression   LYMPH NODE DISSECTION Right 07/25/2022   Procedure: LYMPH NODE DISSECTION;  Surgeon: Loreli Slot, MD;  Location: [facility] OR;  Service: Thoracic;  Laterality: Right;   POLYPECTOMY  05/23/2022   Procedure: POLYPECTOMY;  Surgeon: Lanelle Bal, DO;  [facility]: [facility];  Service: Endoscopy;;   thumb surgery Left    pin placed   TUBAL LIGATION     VIDEO BRONCHOSCOPY WITH ENDOBRONCHIAL NAVIGATION N/A 07/25/2022   Procedure: VIDEO BRONCHOSCOPY WITH ENDOBRONCHIAL NAVIGATION;  Surgeon: Loreli Slot, MD;  Location: [facility] OR;  Service: Thoracic;  Laterality: N/A;  Social History: Social History   Socioeconomic History   Marital status: Married    Spouse name: Not on file   Number of children: 2   Years of education: Not on file   Highest education level: Not on file  Occupational History   Not on file  Tobacco Use   Smoking status: Former    Current packs/day: 0.00    Average packs/day: 0.5 packs/day for 40.0 years (20.0 ttl pk-yrs)    Types: Cigarettes    Start date: 07/15/1982    Quit date: 07/15/2022    Years since quitting: 1.3   Smokeless tobacco: Never  Vaping Use   Vaping status: Former  Substance and Sexual Activity   Alcohol use: Never   Drug use: Not Currently   Sexual activity: Yes  Other Topics Concern   Not on file  Social History Narrative   Lives with husband   Right handed   Caffeine: coffee 4 cups in AM, throughout day 2-3 sodas   Social Drivers of Health   Financial Resource Strain: Low Risk  (08/29/2022)   Overall Financial Resource Strain (CARDIA)    Difficulty of Paying Living Expenses: Not very hard  Food Insecurity: No Food Insecurity (08/29/2022)   Hunger Vital Sign    Worried About Running Out of Food in the Last Year: Never  true    Ran Out of Food in the Last Year: Never true  Transportation Needs: No Transportation Needs (08/29/2022)   PRAPARE - Administrator, Civil Service (Medical): No    Lack of Transportation (Non-Medical): No  Physical Activity: Insufficiently Active (08/29/2022)   Exercise Vital Sign    Days of Exercise per Week: 3 days    Minutes of Exercise per Session: 30 min  Stress: Stress Concern Present (08/29/2022)   [employer] of Occupational Health - Occupational Stress Questionnaire    Feeling of Stress : To some extent  Social Connections: Moderately Integrated (08/29/2022)   Social Connection and Isolation Panel [NHANES]    Frequency of Communication with Friends and Family: Three times a week    Frequency of Social Gatherings with Friends and Family: Three times a week    Attends Religious Services: Never    Active Member of Clubs or Organizations: Yes    Attends Banker Meetings: 1 to 4 times per year    Marital Status: Married  Catering manager Violence: Not At Risk (08/29/2022)   Humiliation, Afraid, Rape, and Kick questionnaire    Fear of Current or Ex-Partner: No    Emotionally Abused: No    Physically Abused: No    Sexually Abused: No    Family History: Family History  Problem Relation Age of Onset   Migraines Mother    Diabetes Mother    Dementia Mother    Cancer Father    Heart disease Father    Colon cancer Neg Hx     Current Medications:  Current Outpatient Medications:    albuterol (PROVENTIL) (2.5 MG/3ML) 0.083% nebulizer solution, Take 3 mLs (2.5 mg total) by nebulization every 4 (four) hours as needed for wheezing or shortness of breath., Disp: 75 mL, Rfl: 12   albuterol (VENTOLIN HFA) 108 (90 Base) MCG/ACT inhaler, Inhale 1-2 puffs into the lungs every 6 (six) hours as needed for wheezing or shortness of breath., Disp: 8 g, Rfl: 3   ARIPiprazole (ABILIFY) 5 MG tablet, 1/2 tablet for 1 week, then 1 tablet daily Orally Once a day  for 30 days (  Patient not taking: Reported on 09/20/2023), Disp: , Rfl:    aspirin 81 MG chewable tablet, Chew 81 mg by mouth daily., Disp: , Rfl:    atorvastatin (LIPITOR) 40 MG tablet, Take 40 mg by mouth daily., Disp: , Rfl:    Budeson-Glycopyrrol-Formoterol (BREZTRI AEROSPHERE) 160-9-4.8 MCG/ACT AERO, Inhale 2 puffs into the lungs 2 (two) times daily., Disp: 10.7 g, Rfl: 11   clonazePAM (KLONOPIN) 0.5 MG tablet, Take 0.5 mg by mouth 3 (three) times daily., Disp: , Rfl:    doxycycline (VIBRA-TABS) 100 MG tablet, Take 1 tablet (100 mg total) by mouth 2 (two) times daily., Disp: 14 tablet, Rfl: 11   DULoxetine (CYMBALTA) 30 MG capsule, Take 30 mg by mouth daily at 12 noon., Disp: , Rfl:    DULoxetine (CYMBALTA) 60 MG capsule, Take 60 mg by mouth in the morning., Disp: , Rfl:    famotidine (PEPCID) 20 MG tablet, One after supper, Disp: 30 tablet, Rfl: 11   hydrochlorothiazide (HYDRODIURIL) 25 MG tablet, Take 25 mg by mouth daily., Disp: , Rfl:    lithium carbonate 300 MG capsule, Take 300 mg by mouth in the morning and at bedtime., Disp: , Rfl:    losartan (COZAAR) 25 MG tablet, Take 25 mg by mouth daily., Disp: , Rfl:    metFORMIN (GLUCOPHAGE) 500 MG tablet, Take 500 mg by mouth daily with breakfast., Disp: , Rfl:    mirtazapine (REMERON) 7.5 MG tablet, Take 7.5 mg by mouth at bedtime., Disp: , Rfl:    Multiple Vitamin (MULTIVITAMIN ADULT PO), Take 1 tablet by mouth daily., Disp: , Rfl:    naloxone (NARCAN) 4 MG/0.1ML LIQD nasal spray kit, Place 1 spray into the nose as needed (opioid overdose)., Disp: , Rfl:    OXYGEN, Inhale 2 L into the lungs continuous., Disp: , Rfl:    pantoprazole (PROTONIX) 40 MG tablet, Take 1 tablet (40 mg total) by mouth daily before breakfast., Disp: 90 tablet, Rfl: 3   predniSONE (DELTASONE) 10 MG tablet, Take  4 each am x 2 days,   2 each am x 2 days,  1 each am x 2 days and stop, Disp: 14 tablet, Rfl: 11   pregabalin (LYRICA) 75 MG capsule, TAKE ONE CAPSULE BY MOUTH  TWICE DAILY, Disp: 60 capsule, Rfl: 5   vitamin C (ASCORBIC ACID) 500 MG tablet, Take 500 mg by mouth daily., Disp: , Rfl:    Vitamin D, Cholecalciferol, 25 MCG (1000 UT) TABS, Take 1,000 Units by mouth daily., Disp: , Rfl:    Allergies: Allergies  Allergen Reactions   Penicillins Swelling    Tongue swelling   Wellbutrin [Bupropion]     shakes    REVIEW OF SYSTEMS:   Review of Systems  Constitutional:  Negative for chills, fatigue and fever.  HENT:   Negative for lump/mass, mouth sores, nosebleeds, sore throat and trouble swallowing.   Eyes:  Negative for eye problems.  Respiratory:  Positive for cough. Negative for shortness of breath.   Cardiovascular:  Negative for chest pain, leg swelling and palpitations.  Gastrointestinal:  Positive for constipation. Negative for abdominal pain, diarrhea and vomiting.  Genitourinary:  Negative for bladder incontinence, difficulty urinating, dysuria, frequency, hematuria and nocturia.   Musculoskeletal:  Negative for arthralgias, back pain, flank pain, myalgias and neck pain.  Skin:  Negative for itching and rash.  Neurological:  Positive for headaches and numbness. Negative for dizziness.  Hematological:  Does not bruise/bleed easily.  Psychiatric/Behavioral:  Positive for depression and sleep disturbance.  Negative for suicidal ideas. The patient is nervous/anxious.   All other systems reviewed and are negative.    VITALS:   There were no vitals taken for this visit.  Wt Readings from Last 3 Encounters:  09/20/23 140 lb (63.5 kg)  06/28/23 146 lb (66.2 kg)  05/30/23 148 lb 4.8 oz (67.3 kg)    There is no height or weight on file to calculate BMI.  Performance status (ECOG): 1 - Symptomatic but completely ambulatory  PHYSICAL EXAM:   Physical Exam Vitals and nursing note reviewed. Exam conducted with a chaperone present.  Constitutional:      Appearance: Normal appearance.  Cardiovascular:     Rate and Rhythm: Normal rate and  regular rhythm.     Pulses: Normal pulses.     Heart sounds: Normal heart sounds.  Pulmonary:     Effort: Pulmonary effort is normal.     Breath sounds: Normal breath sounds.  Abdominal:     Palpations: Abdomen is soft. There is no hepatomegaly, splenomegaly or mass.     Tenderness: There is no abdominal tenderness.  Musculoskeletal:     Right lower leg: No edema.     Left lower leg: No edema.  Lymphadenopathy:     Cervical: No cervical adenopathy.     Right cervical: No superficial, deep or posterior cervical adenopathy.    Left cervical: No superficial, deep or posterior cervical adenopathy.     Upper Body:     Right upper body: No supraclavicular or axillary adenopathy.     Left upper body: No supraclavicular or axillary adenopathy.  Neurological:     General: No focal deficit present.     Mental Status: She is alert and oriented to person, place, and time.  Psychiatric:        Mood and Affect: Mood normal.        Behavior: Behavior normal.    LABS:      Latest Ref Rng & Units 11/27/2023    7:53 AM 05/23/2023    1:03 PM 11/24/2022    8:30 AM  CBC  WBC 4.0 - 10.5 K/uL 9.7  11.4  6.0   Hemoglobin 12.0 - 15.0 g/dL 16.1  09.6  04.5   Hematocrit 36.0 - 46.0 % 48.5  46.2  44.5   Platelets 150 - 400 K/uL 327  304  228       Latest Ref Rng & Units 11/27/2023    7:53 AM 05/23/2023    1:03 PM 03/21/2023   12:49 PM  CMP  Glucose 70 - 99 mg/dL 409  811    BUN 8 - 23 mg/dL 8  10    Creatinine 9.14 - 1.00 mg/dL 7.82  9.56  2.13   Sodium 135 - 145 mmol/L 139  135    Potassium 3.5 - 5.1 mmol/L 4.1  3.5    Chloride 98 - 111 mmol/L 101  97    CO2 22 - 32 mmol/L 32  30    Calcium 8.9 - 10.3 mg/dL 08.6  57.8    Total Protein 6.5 - 8.1 g/dL 8.0  7.6    Total Bilirubin <1.2 mg/dL 0.8  0.9    Alkaline Phos 38 - 126 U/L 90  79    AST 15 - 41 U/L 37  36    ALT 0 - 44 U/L 42  40       No results found for: "CEA1", "CEA" / No results found for: "CEA1", "CEA" No results found  for:  "PSA1" No results found for: "WUX324" No results found for: "CAN125"  No results found for: "TOTALPROTELP", "ALBUMINELP", "A1GS", "A2GS", "BETS", "BETA2SER", "GAMS", "MSPIKE", "SPEI" No results found for: "TIBC", "FERRITIN", "IRONPCTSAT" No results found for: "LDH"   STUDIES:   CT Chest W Contrast Result Date: 12/04/2023 CLINICAL DATA:  Non-small-cell lung cancer. Restaging. * Tracking Code: BO * EXAM: CT CHEST WITH CONTRAST TECHNIQUE: Multidetector CT imaging of the chest was performed during intravenous contrast administration. RADIATION DOSE REDUCTION: This exam was performed according to the departmental dose-optimization program which includes automated exposure control, adjustment of the mA and/or kV according to patient size and/or use of iterative reconstruction technique. CONTRAST:  75mL OMNIPAQUE IOHEXOL 300 MG/ML  SOLN COMPARISON:  05/23/2023 FINDINGS: Cardiovascular: The heart size is normal. No substantial pericardial effusion. Coronary artery calcification is evident. Mild atherosclerotic calcification is noted in the wall of the thoracic aorta. Ascending thoracic aorta measures 4.0 cm diameter. Mediastinum/Nodes: Precarinal lymph node measured previously at 11 mm short axis is now 13 mm short axis on image 57/2. Small prevascular and AP window lymph nodes are also progressive but not enlarged by CT size criteria. There is no hilar lymphadenopathy. The esophagus has normal imaging features. There is no axillary lymphadenopathy. Lungs/Pleura: Centrilobular and paraseptal emphysema evident. Surgical changes noted right apex. 4 mm anterior right lung nodule on 82/3 is similar to prior. Secretions layer dependently in the right lower lobe bronchus with some posterior right lower lobe peripheral airway impaction. New tree-in-bud opacity is seen in the posterior left lower lobe (image 97/3) with nodular components measuring up to 6 mm. 12 mm subsolid peripheral left lung lesion on 76/3 is new  since prior with a cluster of micro nodularity in the anterior left lung (82/3) also new since prior. No pleural effusion. Upper Abdomen: Small cyst upper pole left kidney is similar to prior. Visualized upper abdomen otherwise unremarkable. Musculoskeletal: No worrisome lytic or sclerotic osseous abnormality. IMPRESSION: 1. Mild progression of precarinal lymphadenopathy. Additional small lymph nodes in the mediastinum are also increased in the interval. Close attention on follow-up recommended. 2. New tree-in-bud opacity in the posterior left lower lobe with nodular components measuring up to 6 mm. 12 mm subsolid peripheral left lung lesion is new since prior with a cluster of micro nodularity in the anterior left lung also new since prior. Imaging features are most compatible with an infectious/inflammatory process. Short-term follow-up chest CT recommended to ensure resolution. 3. Secretions layer dependently in the right lower lobe bronchus with some posterior right lower lobe peripheral airway impaction. 4. Aortic Atherosclerosis (ICD10-I70.0) and Emphysema (ICD10-J43.9). Electronically Signed   By: Kennith Center M.D.   On: 12/04/2023 10:08

## 2024-01-29 NOTE — Progress Notes (Unsigned)
 Vicki Blankenship, female    DOB: 08-04-1956    MRN: [id number]   Brief patient profile:  67 yowf  quit smoking 06/2023  from [place]  grew up in house with smokers/ dx freq bronchitis/ear infections referred to pulmonary clinic in [place]  04/18/2022 by Dr Durene Cal.  Daily symptoms since 2011 placed on advair > thrush and since around 2012 just on albuterol and 02 hs since 2010    History of Present Illness  04/18/2022  Pulmonary/ 1st office eval/ Vicki Blankenship / [place] [facility]  Chief Complaint  Patient presents with   Consult    Hx of COPD diagnosed around 2010.   Dyspnea:  [employer] [address] [employer]/ chest tightness walking to mailbox x years 200 ft uphill  to mailbox  Cough: worse since pna dec 2022 esp first thing worse x sev hours x brownish slt bloody mucus and epistaxis since jan 2023  Sleep: on 02 2lpm / flat bed 2 pillows   SABA use: once daily at most but very poor hfa 0 2  2lpm hs but not with activity  Rec We will call to get your last pft from [place] if available  Plan A = Automatic = Always=    Symbicort  80 (for Breztri) Take 2 puffs first thing in am and then another 2 puffs about 12 hours later.   Work on inhaler technique:  Zpak should improve your cough  Plan B = Backup (to supplement plan A, not to replace it) Only use your albuterol inhaler as a rescue medication Make sure you check your oxygen saturation  AT  your highest level of activity (not after you stop)   to be sure it stays over 90%  Suggested e-cigs as an optional  "one way bridge"  Off all tobacco products    CT chest 04/18/22  LDSCT Lung-RADS 4B, suspicious. Additional imaging evaluation or consultation with Pulmonology or Thoracic Surgery recommended. Right middle and right upper lobe pulmonary nodules, suspicious for primary bronchogenic carcinoma or carcinomas. Consider PET of the right middle lobe nodule. The right upper lobe nodule is at the low end of PET resolution. 2. No thoracic adenopathy. 3. Age advanced  coronary artery atherosclerosis. Recommend assessment of coronary risk factors. 4. Aortic atherosclerosis (ICD10-I70.0) and emphysema (ICD10-J43.9).  PET 06/02/22  1. Hypermetabolic medial right middle lobe nodule, high suspicion for malignancy. 2. The 6 by 4 mm right upper lobe nodule is not discernibly Hypermetabolic  3. Borderline enlarged right lower paratracheal lymph node has a maximum SUV of 2.4, equal to the blood pool. This tends to favor benign process.   06/06/2022  f/u ov/ office/Vicki Blankenship re: GOLD 0 copd/ RML nodule  maint on nothing since had to stop symbicort 80  due to mouth irritation   Chief Complaint  Patient presents with   Follow-up    Wants to talk about pet scan done on 6/29  Dyspnea:  [employer] shopping / uses [facility] parking due to  back problems  Cough: mucus is still green in am assoc with "terrible sinuses"  Sleeping: 2lpm flat bed coughing disturbs  SABA use: not using  02: 2lpm hs  Covid status: vax 5 / never infected  Rec Doxycycline 100 mg twice daily x 10 daily  Plan A = Automatic = Always=    Bevespi 2 puffs 1st thing in am and 12 hours later Work on inhaler technique:    Plan B = Backup (to supplement plan A, not to replace it) Only  use your albuterol inhaler as a rescue medication  For cough > mucinex dm 1200 mg every hours as needed  Prednisone 10 mg take  4 each am x 2 days,   2 each am x 2 days,  1 each am x 2 days and stop   S/p robotic assisted right middle lobectomy and wedge resection of the right upper lobe nodule on 07/25/2022.  The right upper lobe nodule showed adenomatous hyperplasia.  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     03/29/2023  f/u ov/[place] office/Vicki Blankenship re: GOLD 2  on no maint rx  and still smoking  No chief complaint on file. Dyspnea:  still walking puppy 50 ft and starts coughing then sob  Cough: discolored worse in am   Sleeping: bed is flat 2 pillows  SABA use: every few hours since not on maint rx  02: 2lpm hs  and prn  Continues with cp now generalized ant and worse with coughing  Rec For cough / congestion > mucinex dm 1200 mg every 12 hours as needed and use your flutter valve as much as possible  For nasty mucus >  doxycycline 100 mg twice daily x 7 days (refillable)  Work on inhaler technique:  Plan A = Automatic = Always=   Breztri Take 2 puffs first thing in am and then another 2 puffs about 12 hours later.  Work on inhaler technique:  Plan B = Backup (to supplement plan A, not to replace it) Only use your albuterol inhaler as a rescue medication Plan C = Crisis (instead of Plan B but only if Plan B stops working) - only use your albuterol nebulizer if you first try Plan B  Plan D = Deltasone Prednisone 10 mg take  4 each am x 2 days,   2 each am x 2 days,  1 each am x 2 days and stop (refillable) Make sure you check your oxygen saturation  AT  your highest level of activity (not after you stop)   to be sure it stays over 90%  Please schedule a follow up visit in 3 months but call sooner if needed  with all RESPIRATORY  medications /inhalers/ solutions in hand    06/28/2023  f/u ov/[place] office/Vicki Blankenship re: GOLD 2 copd/ MPNs/02 dep hs and prn  maint on bevespi maint on breztri  did not bring meds/ has stopped smoking   Chief Complaint  Patient presents with   COPD    Gold 2  Dyspnea:  still doing puppy walk s 02  Cough: worse x  2 weeks, already took pred/doxy >better but sill waking her up/ using flutter but not mucienx dm as rec > dark green along with nasal d/c Sleeping: flat bed 2 pillows   SABA use: no increase  02: 2lpm  and rarely daytime  Rec For cough / congestion > mucinex dm 1200 mg every 12 hours as needed and use your flutter valve as much as possible and if not improving > prednisone x 6 days (refillable)    01/31/2024  f/u ov/[place] office/Vicki Blankenship re: GOLD 2 copd/ MPNs/02  dep hs maint on breztri/ down to one cigarette daily / last pred 2 weeks prior to OV   / protonix/pepcid  Chief Complaint  Patient presents with   Follow-up    Copd   Dyspnea:  still doing puppy walk/ run  Cough: mucinex  dm 1200 mg  but not using flutter  Sleeping: flat bed one pillow    resp cc  SABA use: neb every 4 hours / not as much hfa  02: 2lpm hs and prn    No obvious day to day or daytime variability or assoc mucus plugs or hemoptysis or cp or chest tightness, subjective wheeze or overt  hb symptoms.    Also denies any obvious fluctuation of symptoms with weather or environmental changes or other aggravating or alleviating factors except as outlined above   No unusual exposure hx or h/o childhood pna/ asthma or knowledge of premature birth.  Current Allergies, Complete Past Medical History, Past Surgical History, Family History, and Social History were reviewed in [employer] record.  ROS  The following are not active complaints unless bolded Hoarseness, sore throat, dysphagia, dental problems, itching, sneezing,  nasal congestion or discharge of excess mucus or purulent secretions, ear ache,   fever, chills, sweats, unintended wt loss or wt gain, classically pleuritic or exertional cp,  orthopnea pnd or arm/hand swelling  or leg swelling, presyncope, palpitations, abdominal pain, anorexia, nausea, vomiting, diarrhea  or change in bowel habits or change in bladder habits, change in stools or change in urine, dysuria, hematuria,  rash, arthralgias, visual complaints, headache, numbness, weakness or ataxia or problems with walking or coordination,  change in mood or  memory.        Current Meds  Medication Sig   albuterol (PROVENTIL) (2.5 MG/3ML) 0.083% nebulizer solution Take 3 mLs (2.5 mg total) by nebulization every 4 (four) hours as needed for wheezing or shortness of breath.   albuterol (VENTOLIN HFA) 108 (90 Base) MCG/ACT inhaler Inhale 1-2 puffs into the  lungs every 6 (six) hours as needed for wheezing or shortness of breath.   amoxicillin-clavulanate (AUGMENTIN) 875-125 MG tablet Take 1 tablet by mouth 2 (two) times daily.   ARIPiprazole (ABILIFY) 10 MG tablet Take 10 mg by mouth daily.   aspirin 81 MG chewable tablet Chew 81 mg by mouth daily.   atorvastatin (LIPITOR) 40 MG tablet Take 40 mg by mouth daily.   Budeson-Glycopyrrol-Formoterol (BREZTRI AEROSPHERE) 160-9-4.8 MCG/ACT AERO Inhale 2 puffs into the lungs 2 (two) times daily.   clonazePAM (KLONOPIN) 0.5 MG tablet Take 0.5 mg by mouth 3 (three) times daily.   doxycycline (VIBRA-TABS) 100 MG tablet Take 1 tablet (100 mg total) by mouth 2 (two) times daily.   DULoxetine (CYMBALTA) 30 MG capsule Take 30 mg by mouth daily at 12 noon.   DULoxetine (CYMBALTA) 60 MG capsule Take 60 mg by mouth in the morning.   famotidine (PEPCID) 20 MG tablet One after supper   hydrochlorothiazide (HYDRODIURIL) 25 MG tablet Take 25 mg by mouth daily.   lithium carbonate 300 MG capsule Take 300 mg by mouth in the morning and at bedtime.   losartan (COZAAR) 25 MG tablet Take 25 mg by mouth daily.   metFORMIN (GLUCOPHAGE) 500 MG tablet Take 500 mg by mouth daily with breakfast.   mirtazapine (REMERON) 15 MG tablet Take 15 mg by mouth at bedtime.   Multiple Vitamin (MULTIVITAMIN ADULT PO) Take 1 tablet by mouth daily.   naloxone (NARCAN) 4 MG/0.1ML LIQD nasal spray kit Place 1 spray into the nose as needed (opioid overdose).   OXYGEN Inhale 2 L into the lungs continuous.   pantoprazole (PROTONIX) 40 MG tablet Take 1 tablet (40 mg total) by mouth daily before breakfast.   pregabalin (LYRICA) 75 MG capsule TAKE ONE CAPSULE BY MOUTH  TWICE DAILY   vitamin C (ASCORBIC ACID) 500 MG tablet Take 500 mg by mouth daily.   Vitamin D, Cholecalciferol, 25 MCG (1000 UT) TABS Take 1,000 Units by mouth daily.            Past Medical History:  Diagnosis Date   Anxiety    Arthritis    Bipolar disorder (HCC)    COPD  (chronic obstructive pulmonary disease) (HCC)    Depression    Diabetes (HCC)    Type II   Diabetes mellitus, type II (HCC)    Dyspnea    GERD (gastroesophageal reflux disease)    Headache    migraines- 3 times a week   HLD (hyperlipidemia)    HTN (hypertension)    Pneumonia    PTSD (post-traumatic stress disorder)        Objective:     Wts  01/31/2024       156  06/28/2023       146  02/02/2023       163  12/20/2022       159  08/16/2022       138   06/06/22 143 lb 3.2 oz (65 kg)  05/19/22 149 lb 14.6 oz (68 kg)  04/22/22 150 lb (68 kg)    Vital signs reviewed  01/31/2024  - Note at rest 02 sats  93% on RA   General appearance:    chronically ill/ congested rattling cough    HEENT : Oropharynx  clear      NECK :  without  apparent JVD/ palpable Nodes/TM    LUNGS: no acc muscle use,  Mild barrel  contour chest wall with bilateral  junky rhonchi and  without cough on insp or exp maneuvers  and mild  Hyperresonant  to  percussion bilaterally     CV:  RRR  no s3 or murmur or increase in P2, and no edema   ABD:  soft and nontender with pos end  insp Hoover's  in the supine position.  No bruits or organomegaly appreciated   MS:  Nl gait/ ext warm without deformities Or obvious joint restrictions  calf tenderness, cyanosis or clubbing    SKIN: warm and dry without lesions    NEURO:  alert, approp, nl sensorium with  no motor or cerebellar deficits apparent.      Assessment

## 2024-01-31 ENCOUNTER — Ambulatory Visit: Payer: Medicare HMO | Admitting: Internal Medicine

## 2024-01-31 ENCOUNTER — Encounter: Payer: Self-pay | Admitting: Internal Medicine

## 2024-01-31 VITALS — BP 154/93 | HR 74 | Ht 64.0 in | Wt 156.0 lb

## 2024-01-31 DIAGNOSIS — J449 Chronic obstructive pulmonary disease, unspecified: Secondary | ICD-10-CM

## 2024-01-31 DIAGNOSIS — J9611 Chronic respiratory failure with hypoxia: Secondary | ICD-10-CM | POA: Diagnosis not present

## 2024-01-31 DIAGNOSIS — J9612 Chronic respiratory failure with hypercapnia: Secondary | ICD-10-CM | POA: Diagnosis not present

## 2024-01-31 DIAGNOSIS — F17210 Nicotine dependence, cigarettes, uncomplicated: Secondary | ICD-10-CM | POA: Diagnosis not present

## 2024-01-31 MED ORDER — BREZTRI AEROSPHERE 160-9-4.8 MCG/ACT IN AERO: 2.0000 | INHALATION_SPRAY | Freq: Two times a day (BID) | RESPIRATORY_TRACT | Status: DC

## 2024-01-31 MED ORDER — PREDNISONE 10 MG PO TABS: ORAL_TABLET | ORAL | 11 refills | Status: DC

## 2024-01-31 MED ORDER — LEVOFLOXACIN 500 MG PO TABS: 500.0000 mg | ORAL_TABLET | Freq: Every day | ORAL | 0 refills | Status: DC

## 2024-01-31 NOTE — Assessment & Plan Note (Signed)
 Quit smoking 07/2022  - PFTs 06/04/15 no significant obstruction/ ERV 20% at wt 189 / nl dlco  - Holter 01/22/16 x 24 h  Nl but no symptoms reported during monitor - 04/18/2022  After extensive coaching inhaler device,  effectiveness =    50% > try symbicort 80 2bid due to thrush on advair previously plus approp saba > could not tolerate oral side effects of ics - 06/06/2022   > try Bevespi 2 bid and practice with empty Breztri container - PFT's  07/05/22  FEV1 1.86 (76 % ) ratio 0.65  p 7 % improvement from saba p BREO prior to study with DLCO  14.95 (75%)   and FV curve mild concavity     12/20/2022    try bevespi  - 03/29/2023    flaring with aecopd >>> change to breztri plus added doxy and pred x 6  as part of action plan  - 06/28/2023  After extensive coaching inhaler device,  effectiveness =    80% hfa > continue breztri /flutter with mucinex dm for flares  - 01/31/2024  After extensive coaching inhaler device,  effectiveness =    50% (short ti) > use empty hfa device to teach better technique/ levaquin 500 x 10 for green mucus / pred x 6 d   Clearly still having prolonged aecopd   DDX of  difficult airways management almost all start with A and  include Adherence, Ace Inhibitors, Acid Reflux, Active Sinus Disease, Alpha 1 Antitripsin deficiency, Anxiety masquerading as Airways dz,  ABPA,  Allergy(esp in young), Aspiration (esp in elderly), Adverse effects of meds,  Active smoking or vaping, A bunch of PE's (a small clot burden can't cause this syndrome unless there is already severe underlying pulm or vascular dz with poor reserve) plus two Bs  = Bronchiectasis and Beta blocker use..and one C= CHF   Adherence is always the initial "prime suspect" and is a multilayered concern that requires a "trust but verify" approach in every patient - starting with knowing how to use medications, especially inhalers, correctly, keeping up with refills and understanding the fundamental difference between maintenance and  prns vs those medications only taken for a very short course and then stopped and not refilled.  -  see hfa teaching -  return with all meds in hand using a trust but verify approach to confirm accurate Medication  Reconciliation The principal here is that until we are certain that the  patients are doing what we've asked, it makes no sense to ask them to do more.   Active smoking also near top > see sep a/p  ? Acid (or non-acid) GERD > always difficult to exclude as up to 75% of pts in some series report no assoc GI/ Heartburn symptoms> rec max (24h)  acid suppression and diet restrictions/ reviewed and instructions given in writing.   ? Active sinusitis refractory to augmentin and doxy > levaquin 500 x 10 days  ? Allergy > continue high dose ics/ Prednisone 10 mg take  4 each am x 2 days,   2 each am x 2 days,  1 each am x 2 days and stop   ? Bronchiectasis > For cough/ congestion > mucinex or mucinex dm  up to maximum of  1200 mg every 12 hours and use the flutter valve as much as you can

## 2024-01-31 NOTE — Patient Instructions (Addendum)
 For cough / congestion > mucinex dm 1200 mg every 12 hours as needed and use your flutter valve as much as possible.  Levaquin 500 mg daily x 10 days ( stop if you aches in joints, especially ankles)    Plan A = Automatic = Always=    breztri Take 2 puffs first thing in am and then another 2 puffs about 12 hours later.    Work on inhaler technique:  relax and gently blow all the way out then take a nice smooth full deep breath back in, triggering the inhaler at same time you start breathing in.  Hold breath in for at least  5 seconds if you can. Blow out breztri thru nose. Rinse and gargle with water when done.  If mouth or throat bother you at all,  try brushing teeth/gums/tongue with arm and hammer toothpaste/ make a slurry and gargle and spit out.     Plan B = Backup (to supplement plan A, not to replace it) Only use your albuterol inhaler as a rescue medication to be used if you can't catch your breath by resting or doing a relaxed purse lip breathing pattern.  - The less you use it, the better it will work when you need it. - Ok to use the inhaler up to 2 puffs  every 4 hours if you must but call for appointment if use goes up over your usual need - Don't leave home without it !!  (think of it like the spare tire for your car)   Plan C = Crisis (instead of Plan B but only if Plan B stops working) - only use your albuterol nebulizer if you first try Plan B and it fails to help > ok to use the nebulizer up to every 4 hours but if start needing it regularly call for immediate appointment   Plan D = Deltasone Prednisone 10 mg take  4 each am x 2 days,   2 each am x 2 days,  1 each am x 2 days and stop (refillable)  Make sure you check your oxygen saturation  AT  your highest level of activity (not after you stop)   to be sure it stays over 90% and adjust  02 flow upward to maintain this level if needed but remember to turn it back to previous settings when you stop (to conserve your supply).    The key is to stop smoking completely before smoking completely stops you!    Please schedule a follow up visit in 4  weeks but call sooner if needed  with all RESPIRATORY  medications /inhalers/ solutions in hand so we can verify exactly what you are taking. This includes all medications from all doctors and over the counters

## 2024-01-31 NOTE — Assessment & Plan Note (Signed)
 Counseled re importance of smoking cessation but did not meet time criteria for separate billing   - Says  only one per day and has lost ground with inhaling meds correctly so I suspect much more

## 2024-01-31 NOTE — Assessment & Plan Note (Signed)
 [facility]03   On 03/01/22 =  31 but as high as 35 in 2001  -  04/18/2022   Walked on RA  x  3  lap(s) =  approx 450  ft  @ moderate pace, stopped due to end of study with lowest 02 sats 91% cc sob @ 3rd lap  - 06/06/2022   Walked on RA   x  3  lap(s) =  approx 450  ft  @ fast pace, stopped due to end of walk with lowest 02 sats 90% with sob on 3rd lap   -08/16/2022   Walked on RA  x  3  lap(s) =  approx 450  ft  @ mod pace, stopped due to end of study s sob with lowest 02 sats 96%   Well compensated on RA at rest despite aecopd > advised 1) no change in noct 02 = 2lpm hs  2) Make sure you check your oxygen saturation  AT  your highest level of activity (not after you stop)   to be sure it stays over 90% and adjust  02 flow upward to maintain this level if needed but remember to turn it back to previous settings when you stop (to conserve your supply).          Each maintenance medication was reviewed in detail including emphasizing most importantly the difference between maintenance and prns and under what circumstances the prns are to be triggered using an action plan format where appropriate.  Total time for H and P, chart review, counseling, reviewing hfa/neb/02/ pulse ox/ flutter valve  device(s) and generating customized AVS unique to this office visit / same day charting = 42 min for  refractory respiratory  symptoms of uncertain etiology

## 2024-02-19 ENCOUNTER — Telehealth: Payer: Self-pay | Admitting: Internal Medicine

## 2024-02-19 NOTE — Telephone Encounter (Signed)
 Fax confiramtion received 02/19/24

## 2024-02-20 ENCOUNTER — Ambulatory Visit (HOSPITAL_BASED_OUTPATIENT_CLINIC_OR_DEPARTMENT_OTHER): Payer: Self-pay | Admitting: Internal Medicine

## 2024-02-20 ENCOUNTER — Emergency Department (HOSPITAL_COMMUNITY)

## 2024-02-20 ENCOUNTER — Encounter (HOSPITAL_COMMUNITY): Payer: Self-pay | Admitting: *Deleted

## 2024-02-20 ENCOUNTER — Observation Stay (HOSPITAL_COMMUNITY)
Admission: EM | Admit: 2024-02-20 | Discharge: 2024-02-21 | Disposition: A | Discharge status: Home / Self Care | Attending: Internal Medicine | Admitting: Internal Medicine

## 2024-02-20 DIAGNOSIS — F17210 Nicotine dependence, cigarettes, uncomplicated: Secondary | ICD-10-CM | POA: Insufficient documentation

## 2024-02-20 DIAGNOSIS — E1140 Type 2 diabetes mellitus with diabetic neuropathy, unspecified: Secondary | ICD-10-CM | POA: Insufficient documentation

## 2024-02-20 DIAGNOSIS — C3491 Malignant neoplasm of unspecified part of right bronchus or lung: Secondary | ICD-10-CM | POA: Diagnosis not present

## 2024-02-20 DIAGNOSIS — J441 Chronic obstructive pulmonary disease with (acute) exacerbation: Principal | ICD-10-CM | POA: Insufficient documentation

## 2024-02-20 DIAGNOSIS — I10 Essential (primary) hypertension: Secondary | ICD-10-CM | POA: Insufficient documentation

## 2024-02-20 DIAGNOSIS — R0602 Shortness of breath: Secondary | ICD-10-CM | POA: Diagnosis present

## 2024-02-20 DIAGNOSIS — Z7984 Long term (current) use of oral hypoglycemic drugs: Secondary | ICD-10-CM | POA: Insufficient documentation

## 2024-02-20 DIAGNOSIS — R7303 Prediabetes: Secondary | ICD-10-CM | POA: Diagnosis present

## 2024-02-20 DIAGNOSIS — R911 Solitary pulmonary nodule: Secondary | ICD-10-CM | POA: Insufficient documentation

## 2024-02-20 DIAGNOSIS — J9622 Acute and chronic respiratory failure with hypercapnia: Secondary | ICD-10-CM | POA: Insufficient documentation

## 2024-02-20 DIAGNOSIS — Z85828 Personal history of other malignant neoplasm of skin: Secondary | ICD-10-CM | POA: Insufficient documentation

## 2024-02-20 DIAGNOSIS — E785 Hyperlipidemia, unspecified: Secondary | ICD-10-CM | POA: Insufficient documentation

## 2024-02-20 DIAGNOSIS — J9611 Chronic respiratory failure with hypoxia: Secondary | ICD-10-CM | POA: Diagnosis present

## 2024-02-20 DIAGNOSIS — R918 Other nonspecific abnormal finding of lung field: Secondary | ICD-10-CM | POA: Diagnosis present

## 2024-02-20 DIAGNOSIS — K219 Gastro-esophageal reflux disease without esophagitis: Secondary | ICD-10-CM | POA: Insufficient documentation

## 2024-02-20 DIAGNOSIS — J9621 Acute and chronic respiratory failure with hypoxia: Secondary | ICD-10-CM | POA: Insufficient documentation

## 2024-02-20 DIAGNOSIS — I2510 Atherosclerotic heart disease of native coronary artery without angina pectoris: Secondary | ICD-10-CM | POA: Diagnosis not present

## 2024-02-20 DIAGNOSIS — F17200 Nicotine dependence, unspecified, uncomplicated: Secondary | ICD-10-CM

## 2024-02-20 DIAGNOSIS — J449 Chronic obstructive pulmonary disease, unspecified: Secondary | ICD-10-CM | POA: Diagnosis present

## 2024-02-20 DIAGNOSIS — Z7982 Long term (current) use of aspirin: Secondary | ICD-10-CM | POA: Insufficient documentation

## 2024-02-20 DIAGNOSIS — Z79899 Other long term (current) drug therapy: Secondary | ICD-10-CM | POA: Diagnosis not present

## 2024-02-20 DIAGNOSIS — F319 Bipolar disorder, unspecified: Secondary | ICD-10-CM | POA: Insufficient documentation

## 2024-02-20 DIAGNOSIS — R062 Wheezing: Secondary | ICD-10-CM

## 2024-02-20 DIAGNOSIS — R001 Bradycardia, unspecified: Secondary | ICD-10-CM | POA: Insufficient documentation

## 2024-02-20 DIAGNOSIS — Z9889 Other specified postprocedural states: Secondary | ICD-10-CM

## 2024-02-20 LAB — CBC
HCT: 46.3 % — ABNORMAL HIGH (ref 36.0–46.0)
Hemoglobin: 14.1 g/dL (ref 12.0–15.0)
MCH: 29.9 pg (ref 26.0–34.0)
MCHC: 30.5 g/dL (ref 30.0–36.0)
MCV: 98.3 fL (ref 80.0–100.0)
Platelets: 252 10*3/uL (ref 150–400)
RBC: 4.71 MIL/uL (ref 3.87–5.11)
RDW: 13.7 % (ref 11.5–15.5)
WBC: 16.6 10*3/uL — ABNORMAL HIGH (ref 4.0–10.5)
nRBC: 0 % (ref 0.0–0.2)

## 2024-02-20 LAB — GLUCOSE, CAPILLARY
Glucose-Capillary: 304 mg/dL — ABNORMAL HIGH (ref 70–99)
Glucose-Capillary: 388 mg/dL — ABNORMAL HIGH (ref 70–99)

## 2024-02-20 LAB — BASIC METABOLIC PANEL
Anion gap: 12 (ref 5–15)
BUN: 13 mg/dL (ref 8–23)
CO2: 33 mmol/L — ABNORMAL HIGH (ref 22–32)
Calcium: 11.3 mg/dL — ABNORMAL HIGH (ref 8.9–10.3)
Chloride: 96 mmol/L — ABNORMAL LOW (ref 98–111)
Creatinine, Ser: 0.72 mg/dL (ref 0.44–1.00)
GFR, Estimated: >60 mL/min (ref >60)
Glucose, Bld: 143 mg/dL — ABNORMAL HIGH (ref 70–99)
Potassium: 3.8 mmol/L (ref 3.5–5.1)
Sodium: 141 mmol/L (ref 135–145)

## 2024-02-20 LAB — HEMOGLOBIN A1C
Hgb A1c MFr Bld: 6.4 % — ABNORMAL HIGH (ref 4.8–5.6)
Mean Plasma Glucose: 136.98 mg/dL

## 2024-02-20 LAB — PROCALCITONIN: Procalcitonin: 0.11 ng/mL

## 2024-02-20 LAB — ALBUMIN: Albumin: 3.7 g/dL (ref 3.5–5.0)

## 2024-02-20 MED ORDER — HYDROCHLOROTHIAZIDE 25 MG PO TABS: 25.0000 mg | ORAL_TABLET | Freq: Every day | ORAL | Status: DC

## 2024-02-20 MED ORDER — ALBUTEROL SULFATE (2.5 MG/3ML) 0.083% IN NEBU: 5.0000 mg | INHALATION_SOLUTION | Freq: Once | RESPIRATORY_TRACT | Status: DC

## 2024-02-20 MED ORDER — FENTANYL CITRATE PF 50 MCG/ML IJ SOSY
12.5000 ug | PREFILLED_SYRINGE | INTRAMUSCULAR | Status: DC | PRN
  Administered 2024-02-21: 12.5 ug via INTRAVENOUS
  Filled 2024-02-20: qty 1

## 2024-02-20 MED ORDER — ONDANSETRON HCL 4 MG PO TABS: 4.0000 mg | ORAL_TABLET | Freq: Four times a day (QID) | ORAL | Status: DC | PRN

## 2024-02-20 MED ORDER — LITHIUM CARBONATE 150 MG PO CAPS
300.0000 mg | ORAL_CAPSULE | Freq: Two times a day (BID) | ORAL | Status: DC
Start: 2024-02-20 — End: 2024-02-21
  Administered 2024-02-20 – 2024-02-21 (×2): 300 mg via ORAL
  Filled 2024-02-20 (×2): qty 2

## 2024-02-20 MED ORDER — CLONAZEPAM 0.5 MG PO TABS
0.5000 mg | ORAL_TABLET | Freq: Three times a day (TID) | ORAL | Status: DC | PRN
  Administered 2024-02-21: 0.5 mg via ORAL
  Filled 2024-02-20: qty 1

## 2024-02-20 MED ORDER — ATORVASTATIN CALCIUM 40 MG PO TABS
40.0000 mg | ORAL_TABLET | Freq: Every day | ORAL | Status: DC
  Administered 2024-02-21: 40 mg via ORAL
  Filled 2024-02-20: qty 1

## 2024-02-20 MED ORDER — DULOXETINE HCL 60 MG PO CPEP
60.0000 mg | ORAL_CAPSULE | Freq: Every morning | ORAL | Status: DC
  Administered 2024-02-21: 60 mg via ORAL
  Filled 2024-02-20: qty 1

## 2024-02-20 MED ORDER — ALBUTEROL SULFATE (2.5 MG/3ML) 0.083% IN NEBU
5.0000 mg | INHALATION_SOLUTION | Freq: Once | RESPIRATORY_TRACT | Status: AC
  Administered 2024-02-20: 5 mg via RESPIRATORY_TRACT
  Filled 2024-02-20: qty 6

## 2024-02-20 MED ORDER — PANTOPRAZOLE SODIUM 40 MG PO TBEC
40.0000 mg | DELAYED_RELEASE_TABLET | Freq: Every day | ORAL | Status: DC
  Administered 2024-02-21: 40 mg via ORAL
  Filled 2024-02-20: qty 1

## 2024-02-20 MED ORDER — IPRATROPIUM-ALBUTEROL 0.5-2.5 (3) MG/3ML IN SOLN
3.0000 mL | Freq: Once | RESPIRATORY_TRACT | Status: AC
  Administered 2024-02-20: 3 mL via RESPIRATORY_TRACT

## 2024-02-20 MED ORDER — ALBUTEROL SULFATE (2.5 MG/3ML) 0.083% IN NEBU
2.5000 mg | INHALATION_SOLUTION | Freq: Once | RESPIRATORY_TRACT | Status: AC
  Administered 2024-02-20: 2.5 mg via RESPIRATORY_TRACT

## 2024-02-20 MED ORDER — ACETAMINOPHEN 650 MG RE SUPP: 650.0000 mg | Freq: Four times a day (QID) | RECTAL | Status: DC | PRN

## 2024-02-20 MED ORDER — VITAMIN C 500 MG PO TABS
500.0000 mg | ORAL_TABLET | Freq: Every day | ORAL | Status: DC
  Administered 2024-02-21: 500 mg via ORAL
  Filled 2024-02-20: qty 1

## 2024-02-20 MED ORDER — ALBUTEROL SULFATE (2.5 MG/3ML) 0.083% IN NEBU
INHALATION_SOLUTION | RESPIRATORY_TRACT | Status: AC
  Filled 2024-02-20: qty 3

## 2024-02-20 MED ORDER — ADULT MULTIVITAMIN W/MINERALS CH
1.0000 | ORAL_TABLET | Freq: Every day | ORAL | Status: DC
  Administered 2024-02-21: 1 via ORAL
  Filled 2024-02-20: qty 1

## 2024-02-20 MED ORDER — ARFORMOTEROL TARTRATE 15 MCG/2ML IN NEBU
15.0000 ug | INHALATION_SOLUTION | Freq: Two times a day (BID) | RESPIRATORY_TRACT | Status: DC
  Administered 2024-02-20 – 2024-02-21 (×2): 15 ug via RESPIRATORY_TRACT
  Filled 2024-02-20 (×2): qty 2

## 2024-02-20 MED ORDER — IPRATROPIUM-ALBUTEROL 0.5-2.5 (3) MG/3ML IN SOLN: 3.0000 mL | RESPIRATORY_TRACT | Status: DC | PRN

## 2024-02-20 MED ORDER — ENOXAPARIN SODIUM 40 MG/0.4ML IJ SOSY
40.0000 mg | PREFILLED_SYRINGE | INTRAMUSCULAR | Status: DC
  Administered 2024-02-20: 40 mg via SUBCUTANEOUS
  Filled 2024-02-20: qty 0.4

## 2024-02-20 MED ORDER — ONDANSETRON HCL 4 MG/2ML IJ SOLN: 4.0000 mg | Freq: Four times a day (QID) | INTRAMUSCULAR | Status: DC | PRN

## 2024-02-20 MED ORDER — IPRATROPIUM-ALBUTEROL 0.5-2.5 (3) MG/3ML IN SOLN
RESPIRATORY_TRACT | Status: AC
  Filled 2024-02-20: qty 3

## 2024-02-20 MED ORDER — POTASSIUM CHLORIDE CRYS ER 20 MEQ PO TBCR
40.0000 meq | EXTENDED_RELEASE_TABLET | Freq: Once | ORAL | Status: AC
  Administered 2024-02-20: 40 meq via ORAL
  Filled 2024-02-20: qty 2

## 2024-02-20 MED ORDER — INSULIN ASPART 100 UNIT/ML IJ SOLN
0.0000 [IU] | Freq: Three times a day (TID) | INTRAMUSCULAR | Status: DC
  Administered 2024-02-20: 7 [IU] via SUBCUTANEOUS
  Administered 2024-02-21: 2 [IU] via SUBCUTANEOUS

## 2024-02-20 MED ORDER — ACETAMINOPHEN 325 MG PO TABS: 650.0000 mg | ORAL_TABLET | Freq: Four times a day (QID) | ORAL | Status: DC | PRN

## 2024-02-20 MED ORDER — TRAZODONE HCL 50 MG PO TABS: 25.0000 mg | ORAL_TABLET | Freq: Every evening | ORAL | Status: DC | PRN

## 2024-02-20 MED ORDER — NICOTINE 21 MG/24HR TD PT24
21.0000 mg | MEDICATED_PATCH | Freq: Every day | TRANSDERMAL | Status: DC
Start: 2024-02-20 — End: 2024-02-20

## 2024-02-20 MED ORDER — MIRTAZAPINE 15 MG PO TABS
15.0000 mg | ORAL_TABLET | Freq: Every day | ORAL | Status: DC
  Administered 2024-02-20: 15 mg via ORAL
  Filled 2024-02-20: qty 1

## 2024-02-20 MED ORDER — IPRATROPIUM-ALBUTEROL 0.5-2.5 (3) MG/3ML IN SOLN
3.0000 mL | Freq: Four times a day (QID) | RESPIRATORY_TRACT | Status: DC
  Administered 2024-02-20 – 2024-02-21 (×3): 3 mL via RESPIRATORY_TRACT
  Filled 2024-02-20 (×3): qty 3

## 2024-02-20 MED ORDER — NICOTINE 7 MG/24HR TD PT24
7.0000 mg | MEDICATED_PATCH | Freq: Every day | TRANSDERMAL | Status: DC
  Administered 2024-02-20 – 2024-02-21 (×2): 7 mg via TRANSDERMAL
  Filled 2024-02-20 (×2): qty 1

## 2024-02-20 MED ORDER — ASPIRIN 81 MG PO CHEW
81.0000 mg | CHEWABLE_TABLET | Freq: Every day | ORAL | Status: DC
  Administered 2024-02-21: 81 mg via ORAL
  Filled 2024-02-20: qty 1

## 2024-02-20 MED ORDER — HYDRALAZINE HCL 20 MG/ML IJ SOLN: 10.0000 mg | INTRAMUSCULAR | Status: DC | PRN

## 2024-02-20 MED ORDER — IPRATROPIUM BROMIDE 0.02 % IN SOLN: 0.5000 mg | Freq: Once | RESPIRATORY_TRACT | Status: DC

## 2024-02-20 MED ORDER — PREGABALIN 75 MG PO CAPS
75.0000 mg | ORAL_CAPSULE | Freq: Two times a day (BID) | ORAL | Status: DC
  Administered 2024-02-20 – 2024-02-21 (×2): 75 mg via ORAL
  Filled 2024-02-20 (×2): qty 1

## 2024-02-20 MED ORDER — MAGNESIUM SULFATE 2 GM/50ML IV SOLN
2.0000 g | Freq: Once | INTRAVENOUS | Status: AC
Start: 2024-02-20 — End: 2024-02-20
  Administered 2024-02-20: 2 g via INTRAVENOUS
  Filled 2024-02-20: qty 50

## 2024-02-20 MED ORDER — FUROSEMIDE 10 MG/ML IJ SOLN
20.0000 mg | Freq: Every day | INTRAMUSCULAR | Status: DC
  Administered 2024-02-20 – 2024-02-21 (×2): 20 mg via INTRAVENOUS
  Filled 2024-02-20 (×2): qty 2

## 2024-02-20 MED ORDER — OXYCODONE HCL 5 MG PO TABS
5.0000 mg | ORAL_TABLET | Freq: Four times a day (QID) | ORAL | Status: DC | PRN
  Administered 2024-02-20: 5 mg via ORAL
  Filled 2024-02-20: qty 1

## 2024-02-20 MED ORDER — METHYLPREDNISOLONE SODIUM SUCC 40 MG IJ SOLR
40.0000 mg | Freq: Two times a day (BID) | INTRAMUSCULAR | Status: DC
  Administered 2024-02-20 – 2024-02-21 (×2): 40 mg via INTRAVENOUS
  Filled 2024-02-20 (×2): qty 1

## 2024-02-20 MED ORDER — BISACODYL 5 MG PO TBEC: 5.0000 mg | DELAYED_RELEASE_TABLET | Freq: Every day | ORAL | Status: DC | PRN

## 2024-02-20 MED ORDER — BUDESONIDE 0.25 MG/2ML IN SUSP
0.2500 mg | Freq: Two times a day (BID) | RESPIRATORY_TRACT | Status: DC
  Administered 2024-02-20 – 2024-02-21 (×2): 0.25 mg via RESPIRATORY_TRACT
  Filled 2024-02-20 (×2): qty 2

## 2024-02-20 MED ORDER — LOSARTAN POTASSIUM 50 MG PO TABS
25.0000 mg | ORAL_TABLET | Freq: Every day | ORAL | Status: DC
  Administered 2024-02-21: 25 mg via ORAL
  Filled 2024-02-20: qty 1

## 2024-02-20 MED ORDER — METHYLPREDNISOLONE SODIUM SUCC 125 MG IJ SOLR
125.0000 mg | Freq: Once | INTRAMUSCULAR | Status: AC
  Administered 2024-02-20: 125 mg via INTRAVENOUS
  Filled 2024-02-20: qty 2

## 2024-02-20 MED ORDER — ALBUTEROL SULFATE HFA 108 (90 BASE) MCG/ACT IN AERS: 2.0000 | INHALATION_SPRAY | RESPIRATORY_TRACT | Status: DC | PRN

## 2024-02-20 MED ORDER — GUAIFENESIN ER 600 MG PO TB12
1200.0000 mg | ORAL_TABLET | Freq: Two times a day (BID) | ORAL | Status: DC
  Administered 2024-02-20 – 2024-02-21 (×2): 1200 mg via ORAL
  Filled 2024-02-20 (×2): qty 2

## 2024-02-20 MED ORDER — ARIPIPRAZOLE 10 MG PO TABS
10.0000 mg | ORAL_TABLET | Freq: Every day | ORAL | Status: DC
  Administered 2024-02-21: 10 mg via ORAL
  Filled 2024-02-20: qty 1

## 2024-02-20 NOTE — ED Triage Notes (Signed)
 Pt in from home with husband, per report pt has COPD with current use of oral steroids and rescue inhaler, pt reports just finishing Levoquen, pt O2 sats 77% on RA, pt reports she uses 2 L [place] at night PRN, pt has audible rhonchi and and uses accessory muscles, speaks in short sentences, pt placed on 2 L [place] O2 88%, pt placed on 4 L [place] pt O2 sats 95%

## 2024-02-20 NOTE — Plan of Care (Signed)
   Problem: Skin Integrity: Goal: Risk for impaired skin integrity will decrease Outcome: Progressing   Problem: Activity: Goal: Risk for activity intolerance will decrease Outcome: Progressing   Problem: Coping: Goal: Level of anxiety will decrease Outcome: Progressing

## 2024-02-20 NOTE — ED Provider Notes (Signed)
 [facility] Provider Note   CSN: [id number] Arrival date & time: 02/20/24  1032     History  Chief Complaint  Patient presents with   Shortness of Breath    Vicki Blankenship is a 68 y.o. female.  Patient with hx copd, with increased sob/wheezing, and increased non prod cough in the past week. Is on lower dose, tapering steroid dose - indicates when recently on higher dose steroid med and abx, symptoms transiently improved, but did not resolve, and now breathing is worse in past week. No sore throat or runny nose. No fever or chills. No specific known ill contacts or known covid or flu exposure. Denies chest pain. No leg pain or swelling. Compliant w home meds. Is using nebulizer and mdi q 4 hours. +smoker. Uses home o2 2 liters at night and prn.   The history is provided by the patient, medical records and the spouse.  Shortness of Breath Associated symptoms: cough and wheezing   Associated symptoms: no abdominal pain, no chest pain, no fever, no headaches, no neck pain, no rash, no sore throat and no vomiting        Home Medications Prior to Admission medications   Medication Sig Start Date End Date Taking? Authorizing Provider  albuterol (PROVENTIL) (2.5 MG/3ML) 0.083% nebulizer solution Take 3 mLs (2.5 mg total) by nebulization every 4 (four) hours as needed for wheezing or shortness of breath. 03/29/23   Nyoka Cowden, MD  albuterol (VENTOLIN HFA) 108 (90 Base) MCG/ACT inhaler Inhale 1-2 puffs into the lungs every 6 (six) hours as needed for wheezing or shortness of breath. 03/20/23   Nyoka Cowden, MD  ARIPiprazole (ABILIFY) 10 MG tablet Take 10 mg by mouth daily. 11/25/23   [provider]  aspirin 81 MG chewable tablet Chew 81 mg by mouth daily.    [provider]  atorvastatin (LIPITOR) 40 MG tablet Take 40 mg by mouth daily. 10/07/19   [provider]  Budeson-Glycopyrrol-Formoterol (BREZTRI AEROSPHERE)  160-9-4.8 MCG/ACT AERO Inhale 2 puffs into the lungs 2 (two) times daily. 03/29/23   Nyoka Cowden, MD  Budeson-Glycopyrrol-Formoterol (BREZTRI AEROSPHERE) 160-9-4.8 MCG/ACT AERO Inhale 2 puffs into the lungs in the morning and at bedtime. 01/31/24   Nyoka Cowden, MD  clonazePAM (KLONOPIN) 0.5 MG tablet Take 0.5 mg by mouth 3 (three) times daily. 08/24/20   [provider]  doxycycline (VIBRA-TABS) 100 MG tablet Take 1 tablet (100 mg total) by mouth 2 (two) times daily. 03/29/23   Nyoka Cowden, MD  DULoxetine (CYMBALTA) 30 MG capsule Take 30 mg by mouth daily at 12 noon. 03/15/22   [provider]  DULoxetine (CYMBALTA) 60 MG capsule Take 60 mg by mouth in the morning. 03/08/22   [provider]  famotidine (PEPCID) 20 MG tablet One after supper 06/26/23   Nyoka Cowden, MD  hydrochlorothiazide (HYDRODIURIL) 25 MG tablet Take 25 mg by mouth daily. 10/21/19   [provider]  levofloxacin (LEVAQUIN) 500 MG tablet Take 1 tablet (500 mg total) by mouth daily. 01/31/24   Nyoka Cowden, MD  lithium carbonate 300 MG capsule Take 300 mg by mouth in the morning and at bedtime. 10/24/19   [provider]  losartan (COZAAR) 25 MG tablet Take 25 mg by mouth daily. 10/07/19   [provider]  metFORMIN (GLUCOPHAGE) 500 MG tablet Take 500 mg by mouth daily with breakfast. 10/07/19   [provider]  mirtazapine (REMERON) 15 MG tablet Take 15 mg by mouth at bedtime. 11/25/23   [provider]  Multiple Vitamin (MULTIVITAMIN ADULT PO) Take 1 tablet by mouth daily.    [provider]  naloxone [facility]) 4 MG/0.1ML LIQD nasal spray kit Place 1 spray into the nose as needed (opioid overdose).    [provider]  OXYGEN Inhale 2 L into the lungs continuous.    [provider]  pantoprazole (PROTONIX) 40 MG tablet Take 1 tablet (40 mg total) by mouth daily before breakfast. 03/22/22   Tiffany Kocher, PA-C  predniSONE  (DELTASONE) 10 MG tablet Take  4 each am x 2 days,   2 each am x 2 days,  1 each am x 2 days and stop 01/31/24   Nyoka Cowden, MD  pregabalin (LYRICA) 75 MG capsule TAKE ONE CAPSULE BY MOUTH TWICE DAILY 10/30/23   Cindie Crumbly, MD  vitamin C (ASCORBIC ACID) 500 MG tablet Take 500 mg by mouth daily.    [provider]  Vitamin D, Cholecalciferol, 25 MCG (1000 UT) TABS Take 1,000 Units by mouth daily.    [provider]      Allergies    Penicillins and Wellbutrin [bupropion]    Review of Systems   Review of Systems  Constitutional:  Negative for chills and fever.  HENT:  Negative for sore throat.   Respiratory:  Positive for cough, shortness of breath and wheezing.   Cardiovascular:  Negative for chest pain and leg swelling.  Gastrointestinal:  Negative for abdominal pain and vomiting.  Genitourinary:  Negative for flank pain.  Musculoskeletal:  Negative for back pain and neck pain.  Skin:  Negative for rash.  Neurological:  Negative for headaches.    Physical Exam Updated Vital Signs BP 125/77   Pulse 81   Temp 98.2 F (36.8 C) (Oral)   Resp (!) 24   SpO2 93%  Physical Exam Vitals and nursing note reviewed.  Constitutional:      General: She is in acute distress.     Appearance: She is well-developed.  HENT:     Head: Atraumatic.     Nose: Nose normal.     Mouth/Throat:     Mouth: Mucous membranes are moist.     Pharynx: Oropharynx is clear.  Eyes:     General: No scleral icterus.    Conjunctiva/sclera: Conjunctivae normal.  Neck:     Trachea: No tracheal deviation.  Cardiovascular:     Rate and Rhythm: Normal rate and regular rhythm.     Pulses: Normal pulses.     Heart sounds: Normal heart sounds. No murmur heard.    No friction rub. No gallop.  Pulmonary:     Effort: Respiratory distress present.     Breath sounds: Wheezing present.  Abdominal:     General: There is no distension.     Palpations: Abdomen is soft.     Tenderness:  There is no abdominal tenderness.  Musculoskeletal:        General: No swelling or tenderness.     Cervical back: Normal range of motion and neck supple. No rigidity. No muscular tenderness.     Right lower leg: No edema.     Left lower leg: No edema.  Skin:    General: Skin is warm and dry.     Findings: No rash.  Neurological:     Mental Status: She is alert.     Comments: Alert, speech normal.  Psychiatric:        Mood and Affect: Mood normal.     ED Results / Procedures / Treatments   Labs (all labs ordered are listed, but only abnormal results are displayed) Results for orders placed or performed during the hospital encounter of 02/20/24  Basic metabolic panel   Collection Time: 02/20/24 11:14 AM  Result Value Ref Range   Sodium 141 135 - 145 mmol/L   Potassium 3.8 3.5 - 5.1 mmol/L   Chloride 96 (L) 98 - 111 mmol/L   CO2 33 (H) 22 - 32 mmol/L   Glucose, Bld 143 (H) 70 - 99 mg/dL   BUN 13 8 - 23 mg/dL   Creatinine, Ser 1.61 0.44 - 1.00 mg/dL   Calcium 09.6 (H) 8.9 - 10.3 mg/dL   GFR, Estimated >04 >54 mL/min   Anion gap 12 5 - 15  CBC   Collection Time: 02/20/24 11:14 AM  Result Value Ref Range   WBC 16.6 (H) 4.0 - 10.5 K/uL   RBC 4.71 3.87 - 5.11 MIL/uL   Hemoglobin 14.1 12.0 - 15.0 g/dL   HCT 09.8 (H) 11.9 - 14.7 %   MCV 98.3 80.0 - 100.0 fL   MCH 29.9 26.0 - 34.0 pg   MCHC 30.5 30.0 - 36.0 g/dL   RDW 82.9 56.2 - 13.0 %   Platelets 252 150 - 400 K/uL   nRBC 0.0 0.0 - 0.2 %     EKG EKG Interpretation Date/Time:  Tuesday February 20 2024 10:42:21 EDT Ventricular Rate:  88 PR Interval:  160 QRS Duration:  82 QT Interval:  362 QTC Calculation: 438 R Axis:   94  Text Interpretation: Normal sinus rhythm Rightward axis Nonspecific ST abnormality Confirmed by Cathren Laine ([id number]) on 02/20/2024 11:15:30 AM  Radiology No results found.  Procedures Procedures    Medications Ordered in ED Medications  albuterol (VENTOLIN HFA) 108 (90 Base) MCG/ACT  inhaler 2 puff (has no administration in time range)  albuterol (PROVENTIL) (2.5 MG/3ML) 0.083% nebulizer solution 5 mg (has no administration in time range)  methylPREDNISolone sodium succinate (SOLU-MEDROL) 125 mg/2 mL injection 125 mg (125 mg Intravenous Given 02/20/24 1157)  magnesium sulfate IVPB 2 g 50 mL (0 g Intravenous Stopped 02/20/24 1301)  ipratropium-albuterol (DUONEB) 0.5-2.5 (3) MG/3ML nebulizer solution 3 mL (3 mLs Nebulization Given 02/20/24 1146)  albuterol (PROVENTIL) (2.5 MG/3ML) 0.083% nebulizer solution 2.5 mg (2.5 mg Nebulization Given 02/20/24 1146)    ED Course/ Medical Decision Making/ A&P                                 Medical Decision Making Problems Addressed: Acute on chronic respiratory failure with hypoxia and hypercapnia (HCC): acute illness or injury with systemic symptoms that poses a threat to life or bodily functions    Details: Acute/chronic COPD exacerbation (HCC): acute illness or injury with systemic symptoms that poses a threat to life or bodily functions Tobacco use disorder: chronic illness or injury that poses a threat to life or bodily functions Wheezing: acute illness or injury with systemic symptoms  Amount and/or Complexity of Data Reviewed Independent Historian: spouse    Details: hx External Data Reviewed: notes. Labs: ordered. Decision-making details documented in ED Course. Radiology: ordered and independent interpretation performed. Decision-making details documented in ED Course. ECG/medicine tests: ordered and independent interpretation performed. Decision-making details documented in ED Course. Discussion of management or test interpretation with external provider(s): medicine  Risk Prescription drug management. Decision regarding hospitalization.   Iv ns. Continuous pulse ox and cardiac monitoring. Labs ordered/sent. Imaging ordered.   Differential diagnosis includes copd exacerbation, pna, covid, flu, etc. Dispo decision  including potential need for admission considered - will get labs and imaging and reassess.   Reviewed nursing notes and prior charts for additional history. External reports reviewed. Additional history from: spouse.   Solumedrol iv. Albuterol and atrovent nebulzier. Mg iv.   Cardiac monitor: sinus rhythm, rate 88.  Labs reviewed/interpreted by me - wbc 16, hct 46, na, k normal.   Xrays reviewed/interpreted by me - no pna.   Wheezing improved but persists. Additional albuterol and atrovent nebs.   Persistent wheezing on exam, room air sats low - will admit to hospitalists.   Additional neb ordered.   CRITICAL CARE RE: acute on chronic respiratory failure w hypoxia, copd exacerbation Performed by: Suzi Roots Total critical care time: 40 minutes Critical care time was exclusive of separately billable procedures and treating other patients. Critical care was necessary to treat or prevent imminent or life-threatening deterioration. Critical care was time spent personally by me on the following activities: development of treatment plan with patient and/or surrogate as well as nursing, discussions with consultants, evaluation of patient's response to treatment, examination of patient, obtaining history from patient or surrogate, ordering and performing treatments and interventions, ordering and review of laboratory studies, ordering and review of radiographic studies, pulse oximetry and re-evaluation of patient's condition.         Final Clinical Impression(s) / ED Diagnoses Final diagnoses:  COPD exacerbation (HCC)  Acute on chronic respiratory failure with hypoxia and hypercapnia (HCC)  Wheezing  Tobacco use disorder    Rx / DC Orders ED Discharge Orders     None         Cathren Laine, MD 02/20/24 1328

## 2024-02-20 NOTE — Plan of Care (Signed)
   Problem: Education: Goal: Ability to describe self-care measures that may prevent or decrease complications (Diabetes Survival Skills Education) will improve Outcome: Progressing Goal: Individualized Educational Video(s) Outcome: Progressing

## 2024-02-20 NOTE — Telephone Encounter (Addendum)
 Call from [employer] office, spoke to Cecilia, office reporting pt experiencing can't catch her breath, she was seen on 2/26 and said to call for appt if no better, was prescribed levoquin and mucinex, pt says she can't make appt today, no other appts available. Speaking to pt.  TRIAGE SUMMARY NOTE: Pt reporting that she is having "terrible wheezing, coughing, gurgling sounds, very labored breathing," with a "moderate" change in SOB but scaring her, also some chest pain intermittently, been using her rescue inhalers/nebulizers more often, last used 20 minutes ago. While conversation paused later in phone call, nurse could hear audible wheezing and stridor, pt confirms harsh sounds with both inhalation and exhalation, confirms struggling to breathe. Advised pt call 911, offered to call for pt, pt states she and husband will call.  E2C2 Pulmonary Triage - Initial Assessment Questions "Chief Complaint (e.g., cough, sob, wheezing, fever, chills, sweat or additional symptoms) *Go to specific symptom protocol after initial questions. Terrible wheezing along with coughing Gurgling sounds Very labored breathing Saw Dr. Sherene Sires on 2/26 had been sick and gave antibx and back on prednisone, lasted 2 weeks started to feel little better then started again and worse than before, scaring me Constant, moderate change in SOB, if try to lie down it's worse, get up little bit better, walking makes it worse Audible wheezing on phone Chest pain when feel breathing getting worse because start to panic, and when coughing, dizziness no not really  "How long have symptoms been present?" Right after finished antibx 10 days after starting it  "Have you used your inhalers/maintenance medication?" Yes If yes, "What medications?" Been using breztri, albuterol inhaler and nebulizer, just did the nebulizer 20 min, get a little bit better not as severe  If inhaler, ask "How many puffs and how often?" Note: Review  instructions on medication in the chart. Every 4 hours  OXYGEN: "Do you wear supplemental oxygen?" Yes If yes, "How many liters are you supposed to use?" 2L, not gone above that been afraid to, not been told by doc if/when she can titrate up  "Do you monitor your oxygen levels?" No  Reason for Disposition  Stridor (harsh sound while breathing in)  Answer Assessment - Initial Assessment Questions 6. CARDIAC HISTORY: "Do you have any history of heart disease?" (e.g., heart attack, angina, bypass surgery, angioplasty)      Significant hx 7. LUNG HISTORY: "Do you have any history of lung disease?"  (e.g., pulmonary embolus, asthma, emphysema)     Significant hx  Protocols used: Breathing Difficulty-A-AH

## 2024-02-20 NOTE — TOC Initial Note (Signed)
 Transition of Care [facility]) - Initial/Assessment Note    Patient Details  Name: Vicki Blankenship MRN: [id number] Date of Birth: 04/22/1956  Transition of Care [facility]) CM/SW Contact:    Barron Alvine, RN Phone Number: 02/20/2024, 10:22 PM  Clinical Narrative:                  68 y/o female c/COPD exacerbation. Has home O2 PRN. Lives c/husband. TOC to follow.        Patient Goals and CMS Choice            Expected Discharge Plan and Services                                              Prior Living Arrangements/Services                       Activities of Daily Living   ADL Screening (condition at time of admission) Independently performs ADLs?: Yes (appropriate for developmental age) Is the patient deaf or have difficulty hearing?: No Does the patient have difficulty seeing, even when wearing glasses/contacts?: No Does the patient have difficulty concentrating, remembering, or making decisions?: No  Permission Sought/Granted                  Emotional Assessment              Admission diagnosis:  Tobacco use disorder [F17.200] Wheezing [R06.2] COPD exacerbation (HCC) [J44.1] COPD with acute exacerbation (HCC) [J44.1] Acute on chronic respiratory failure with hypoxia and hypercapnia (HCC) [H08.65, J96.22] Patient Active Problem List   Diagnosis Date Noted   COPD with acute exacerbation (HCC) 02/20/2024   Acute on chronic respiratory failure with hypoxia (HCC) 02/20/2024   CAD (coronary artery disease) 09/20/2023   Bipolar I disorder with depression (HCC) 06/28/2023   Hypotension due to drugs 06/28/2023   Suicide ideation 06/28/2023   Syncope and collapse 06/28/2023   History of smoking 06/28/2023   HTN, goal below 130/80 03/16/2023   HLD (hyperlipidemia) 03/16/2023   Stabbing chest pain 03/16/2023   Musculoskeletal chest pain 03/16/2023   PAD (peripheral artery disease) (HCC) 03/16/2023   Squamous cell lung cancer, right (HCC)  08/29/2022   S/P Right Robotic Assisted Right Upper Lobe Wedge Resection and Right Middle Lobectomy 07/26/2022   Non-small cell carcinoma of lung, right (HCC) 07/25/2022   Multiple pulmonary nodules determined by computed tomography of lung 05/23/2022   Diabetes mellitus, stable (HCC) 04/22/2022   COPD GOLD 2  04/18/2022   Chronic respiratory failure with hypoxia and hypercapnia (HCC) 04/18/2022   Hypercalcemia 04/08/2022   Prediabetes 04/08/2022   Osteopenia of both hips 04/08/2022   Vitamin D deficiency 04/08/2022   Cigarette smoker 04/08/2022   Lumbar radiculopathy 04/14/2020   GERD (gastroesophageal reflux disease) 10/30/2019   Abdominal pain, epigastric 10/30/2019   Alternating constipation and diarrhea 10/30/2019   History of colonic polyps 10/30/2019   Bipolar affective disorder, currently depressed, moderate (HCC) 01/22/2016   Bradycardia 01/19/2016   PCP:  Nira Retort, FNP Pharmacy:   [facility] - [place], [place] - [address] [place] [address] Phone: [phone] Fax: [phone]     Social Drivers of Health (SDOH) Social History: SDOH Screenings   Food Insecurity: No Food Insecurity (02/20/2024)  Housing: Low Risk  (02/20/2024)  Transportation Needs: No  Transportation Needs (02/20/2024)  Utilities: Not At Risk (02/20/2024)  Alcohol Screen: Low Risk  (08/29/2022)  Depression (PHQ2-9): Medium Risk (08/29/2022)  Financial Resource Strain: Low Risk  (08/29/2022)  Physical Activity: Insufficiently Active (08/29/2022)  Social Connections: Moderately Integrated (02/20/2024)  Stress: Stress Concern Present (08/29/2022)  Tobacco Use: High Risk (02/20/2024)   SDOH Interventions:     Readmission Risk Interventions     No data to display

## 2024-02-20 NOTE — H&P (Signed)
 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 Sherene Sires has her on prednisone BID that she does not always take regularly due to facial puffiness), hypercalcemia, depression and anxiety, bipolar disorder, type 2 diabetes mellitus, GERD, headaches, hyperlipidemia, hypertension, PTSD,'s history of seizures, history of head injury and left frontal meningioma reportedly had been treated for COPD exacerbation by her pulmonologist a few weeks ago and she never completely got better.  Her respiratory symptoms have worsened over the past several days and she called her pulmonologist office today porting that she is having terrible wheezing, coughing and gurgling sounds and labored breathing and they directed her to call 911 and come to the emergency department.  She was noted to have a pulse ox that was 77% on room air.  She reports that she normally uses 2 L of nasal cannula at night as needed.  She was placed on 4 L nasal cannula with some improvement in her oxygen saturation to 95%.  Her chest x-ray showed COPD but no clear infiltrate.  Her calcium was elevated at 11.3.  Her glucose was 143.  Her white blood cell count was 16.6.  Her hemoglobin was 14.1.  She was given some nebulizer treatments in the emergency department in addition to supplemental oxygenation however continued to have increasing work of breathing and admission was requested for treatment of COPD exacerbation.   Past Medical History:  Diagnosis Date   Anemia    Anxiety    Aortic  atherosclerosis (HCC)    Arthritis    Bipolar disorder (HCC)    Chronic bronchitis with COPD (chronic obstructive pulmonary disease) (HCC)    COPD (chronic obstructive pulmonary disease) (HCC)    Depression    Diabetes (HCC)    Type II   Diabetes mellitus, type II (HCC)    Dyspnea    GERD (gastroesophageal reflux disease)    Head injury    Headache    migraines- 3 times a week   HLD (hyperlipidemia)    HTN (hypertension)    Lesion of lung 2023   suspect lung cancer per pt   LOC (loss of consciousness) (HCC)    Meningioma (HCC)    L frontal   Pneumonia    PTSD (post-traumatic stress disorder)    Seizure (HCC)    hx of    Past Surgical History:  Procedure Laterality Date   BIOPSY  05/23/2022   Procedure: BIOPSY;  Surgeon: Lanelle Bal, DO;  Location: [facility];  Service: Endoscopy;;   CARPAL TUNNEL RELEASE Right    CHOLECYSTECTOMY  2013   COLONOSCOPY  2015   In [place].  Per patient, she had colon polyps.   COLONOSCOPY WITH PROPOFOL N/A 05/23/2022   Procedure: COLONOSCOPY WITH PROPOFOL;  Surgeon: Lanelle Bal, DO;  [facility]: [facility];  Service: Endoscopy;  Laterality: N/A;  11:00am   ESOPHAGOGASTRODUODENOSCOPY (EGD) WITH PROPOFOL N/A 05/23/2022   Procedure: ESOPHAGOGASTRODUODENOSCOPY (EGD) WITH PROPOFOL;  Surgeon: Lanelle Bal, DO;  [facility]: [facility];  Service: Endoscopy;  Laterality: N/A;  FOOT SURGERY Left    INTERCOSTAL NERVE BLOCK Right 07/25/2022   Procedure: INTERCOSTAL NERVE BLOCK;  Surgeon: Loreli Slot, MD;  Location: [facility] OR;  Service: Thoracic;  Laterality: Right;   LUMBAR LAMINECTOMY/ DECOMPRESSION WITH MET-RX Left 04/14/2020   Procedure: Left Lumbar Four-Five Minimally invasive lumbar decompression;  Surgeon: Jadene Pierini, MD;  Location: [facility] OR;  Service: Neurosurgery;  Laterality: Left;  Left Lumbar Four-Five Minimally invasive lumbar decompression   LYMPH NODE DISSECTION Right 07/25/2022   Procedure: LYMPH NODE  DISSECTION;  Surgeon: Loreli Slot, MD;  Location: [facility] OR;  Service: Thoracic;  Laterality: Right;   POLYPECTOMY  05/23/2022   Procedure: POLYPECTOMY;  Surgeon: Lanelle Bal, DO;  [facility];  Service: Endoscopy;;   thumb surgery Left    pin placed   TUBAL LIGATION     VIDEO BRONCHOSCOPY WITH ENDOBRONCHIAL NAVIGATION N/A 07/25/2022   Procedure: VIDEO BRONCHOSCOPY WITH ENDOBRONCHIAL NAVIGATION;  Surgeon: Loreli Slot, MD;  Location: [facility] OR;  Service: Thoracic;  Laterality: N/A;     reports that she has been smoking cigarettes. She started smoking about 41 years ago. She has a 20 pack-year smoking history. She has never used smokeless tobacco. She reports that she does not currently use drugs. She reports that she does not drink alcohol.  Allergies  Allergen Reactions   Penicillins Swelling    Tongue swelling   Wellbutrin [Bupropion]     shakes    Family History  Problem Relation Age of Onset   Migraines Mother    Diabetes Mother    Dementia Mother    Cancer Father    Heart disease Father    Colon cancer Neg Hx     Prior to Admission medications   Medication Sig Start Date End Date Taking? Authorizing Provider  albuterol (PROVENTIL) (2.5 MG/3ML) 0.083% nebulizer solution Take 3 mLs (2.5 mg total) by nebulization every 4 (four) hours as needed for wheezing or shortness of breath. 03/29/23  Yes Nyoka Cowden, MD  albuterol (VENTOLIN HFA) 108 (90 Base) MCG/ACT inhaler Inhale 1-2 puffs into the lungs every 6 (six) hours as needed for wheezing or shortness of breath. 03/20/23  Yes Nyoka Cowden, MD  ARIPiprazole (ABILIFY) 10 MG tablet Take 10 mg by mouth daily. 11/25/23  Yes [provider]  aspirin 81 MG chewable tablet Chew 81 mg by mouth daily.   Yes [provider]  atorvastatin (LIPITOR) 40 MG tablet Take 40 mg by mouth daily. 10/07/19  Yes [provider]  Budeson-Glycopyrrol-Formoterol (BREZTRI AEROSPHERE) 160-9-4.8  MCG/ACT AERO Inhale 2 puffs into the lungs 2 (two) times daily. 03/29/23  Yes Nyoka Cowden, MD  clonazePAM (KLONOPIN) 0.5 MG tablet Take 0.5 mg by mouth 3 (three) times daily. 08/24/20  Yes [provider]  DULoxetine (CYMBALTA) 60 MG capsule Take 60 mg by mouth in the morning. 03/08/22  Yes [provider]  famotidine (PEPCID) 20 MG tablet One after supper 06/26/23  Yes Nyoka Cowden, MD  guaiFENesin (MUCINEX) 600 MG 12 hr tablet Take 1,200 mg by mouth 2 (two) times daily.   Yes [provider]  hydrochlorothiazide (HYDRODIURIL) 25 MG tablet Take 25 mg by mouth daily. 10/21/19  Yes [provider]  lithium carbonate 300 MG capsule Take 300 mg by mouth in the morning and at bedtime. 10/24/19  Yes [provider]  losartan (COZAAR) 25 MG tablet Take 25 mg by mouth daily. 10/07/19  Yes [provider]  metFORMIN (  GLUCOPHAGE) 500 MG tablet Take 500 mg by mouth daily with breakfast. 10/07/19  Yes [provider]  mirtazapine (REMERON) 15 MG tablet Take 15 mg by mouth at bedtime. 11/25/23  Yes [provider]  Multiple Vitamin (MULTIVITAMIN ADULT PO) Take 1 tablet by mouth daily.   Yes [provider]  naloxone (NARCAN) 4 MG/0.1ML LIQD nasal spray kit Place 1 spray into the nose as needed (opioid overdose).   Yes [provider]  OXYGEN Inhale 2 L into the lungs continuous.   Yes [provider]  pantoprazole (PROTONIX) 40 MG tablet Take 1 tablet (40 mg total) by mouth daily before breakfast. 03/22/22  Yes Tiffany Kocher, PA-C  pregabalin (LYRICA) 75 MG capsule TAKE ONE CAPSULE BY MOUTH TWICE DAILY 10/30/23  Yes Cindie Crumbly, MD  vitamin C (ASCORBIC ACID) 500 MG tablet Take 500 mg by mouth daily.   Yes [provider]  Vitamin D, Cholecalciferol, 25 MCG (1000 UT) TABS Take 1,000 Units by mouth daily.   Yes [provider]    Physical Exam: Vitals:   02/20/24 1215 02/20/24 1230  02/20/24 1300 02/20/24 1430  BP: 118/68 118/73 125/77 134/82  Pulse: 82 76 81 81  Resp:      Temp:      TempSrc:      SpO2: 94% 92% 93% 94%    Constitutional: NAD, calm, uncomfortable, frequent loud raspy wheezy coughing heard.  Eyes: PERRL, lids and conjunctivae normal ENMT: Mucous membranes are moist. Posterior pharynx clear of any exudate or lesions.Normal dentition.  Neck: normal, supple, no masses, no thyromegaly Respiratory: diffuse bilateral wheezing heard, moderate increased work of breathing.   Cardiovascular: normal s1, s2 sounds, no murmurs / rubs / gallops. No extremity edema. 2+ pedal pulses. No carotid bruits.  Abdomen: no tenderness, no masses palpated. No hepatosplenomegaly. Bowel sounds positive.  Musculoskeletal: no clubbing / cyanosis. No joint deformity upper and lower extremities. Good ROM, no contractures. Normal muscle tone.  Skin: no rashes, lesions, ulcers. No induration Neurologic: CN 2-12 grossly intact. Sensation intact, DTR normal. Strength 5/5 in all 4.  Psychiatric: Normal judgment and insight. Alert and oriented x 3. Normal mood.   Labs on Admission: I have personally reviewed following labs and imaging studies  CBC: Recent Labs  Lab 02/20/24 1114  WBC 16.6*  HGB 14.1  HCT 46.3*  MCV 98.3  PLT 252   Basic Metabolic Panel: Recent Labs  Lab 02/20/24 1114  NA 141  K 3.8  CL 96*  CO2 33*  GLUCOSE 143*  BUN 13  CREATININE 0.72  CALCIUM 11.3*   GFR: CrCl cannot be calculated (Unknown ideal weight.). Liver Function Tests: Recent Labs  Lab 02/20/24 1355  ALBUMIN 3.7   No results for input(s): "LIPASE", "AMYLASE" in the last 168 hours. No results for input(s): "AMMONIA" in the last 168 hours. Coagulation Profile: No results for input(s): "INR", "PROTIME" in the last 168 hours. Cardiac Enzymes: No results for input(s): "CKTOTAL", "CKMB", "CKMBINDEX", "TROPONINI" in the last 168 hours. BNP (last 3 results) No results for input(s):  "PROBNP" in the last 8760 hours. HbA1C: No results for input(s): "HGBA1C" in the last 72 hours. CBG: No results for input(s): "GLUCAP" in the last 168 hours. Lipid Profile: No results for input(s): "CHOL", "HDL", "LDLCALC", "TRIG", "CHOLHDL", "LDLDIRECT" in the last 72 hours. Thyroid Function Tests: No results for input(s): "TSH", "T4TOTAL", "FREET4", "T3FREE", "THYROIDAB" in the last 72 hours. Anemia Panel: No results for input(s): "VITAMINB12", "FOLATE", "FERRITIN", "TIBC", "IRON", "  RETICCTPCT" in the last 72 hours. Urine analysis:    Component Value Date/Time   COLORURINE YELLOW 07/25/2022 2145   APPEARANCEUR CLEAR 07/25/2022 2145   LABSPEC >1.046 (H) 07/25/2022 2145   PHURINE 7.0 07/25/2022 2145   GLUCOSEU NEGATIVE 07/25/2022 2145   HGBUR MODERATE (A) 07/25/2022 2145   BILIRUBINUR NEGATIVE 07/25/2022 2145   KETONESUR NEGATIVE 07/25/2022 2145   PROTEINUR NEGATIVE 07/25/2022 2145   NITRITE NEGATIVE 07/25/2022 2145   LEUKOCYTESUR NEGATIVE 07/25/2022 2145    Radiological Exams on Admission: DG Chest Port 1 View Result Date: 02/20/2024 CLINICAL DATA:  Shortness of breath. History of non-small-cell lung cancer. EXAM: PORTABLE CHEST 1 VIEW COMPARISON:  X-ray 10/11/2022.  CT 11/27/2023. FINDINGS: No consolidation, pneumothorax or effusion. There is some linear opacity lung bases likely scar or atelectasis. Normal cardiopericardial silhouette. Overlapping cardiac leads. Tortuous and ectatic aorta. Linear changes at the right upper lobe consistent with prior surgical intervention as per history and prior CT. Overall diffuse chronic lung changes. IMPRESSION: Chronic changes.  No acute cardiopulmonary disease. Electronically Signed   By: Karen Kays M.D.   On: 02/20/2024 13:59   Assessment/Plan Principal Problem:   COPD with acute exacerbation (HCC) Active Problems:   Acute on chronic respiratory failure with hypoxia (HCC)   GERD (gastroesophageal reflux disease)   Hypercalcemia    Prediabetes   Cigarette smoker   COPD GOLD 2    Chronic respiratory failure with hypoxia and hypercapnia (HCC)   Multiple pulmonary nodules determined by computed tomography of lung   Non-small cell carcinoma of lung, right (HCC)   S/P Right Robotic Assisted Right Upper Lobe Wedge Resection and Right Middle Lobectomy   Squamous cell lung cancer, right (HCC)   HTN, goal below 130/80   HLD (hyperlipidemia)   Bipolar I disorder with depression (HCC)   Bradycardia   CAD (coronary artery disease)   Acute respiratory failure with hypoxia -Secondary to acute COPD exacerbation that is failed outpatient management -Patient presenting with severe hypoxia with SpO2 in the 70s -Patient is steroid-dependent COPD Gold 2 per pulmonary records -Admit for IV steroids, scheduled bronchodilator treatments and supportive measures. -Check procalcitonin as patient recently completed course of steroids  COPD with acute exacerbation -Failed outpatient management -Continue scheduled IV methylprednisolone -Continue budesonide and Brovana nebulizer treatments -Schedule DuoNebs -Continue Mucinex -Continue supplemental oxygen with goal to wean as able -Pulse ox goal greater than 87% -Cough suppressants -dose furosemide  Bipolar Disorder Anxiety and depression  -Resumed home behavioral health medications -See MAR  GERD -Pantoprazole ordered for GI symptoms and protection  Hypercalcemia, asymptomatic -Check 25-hydroxy vitamin D level -Hydrochlorothiazide is known to decrease urinary calcium excretion, which can lead to an increase in serum calcium levels.  -Hold home H.C.T.Z -Recheck BMP in a.m.  History of non-small cell right lung cancer -Status post lobectomy -Followed by Dr. Ellin Saba with regular CT scanning  Essential hypertension -As noted we are HCTZ due to hypercalcemia -hydralazine ordered IV PRN  Hyperlipidemia -Resume atorvastatin 40 mg  Prediabetes with peripheral neuropathy   -Holding home oral metformin -SSI coverage with frequent CBG monitoring ordered -Follow-up A1c pending  Peripheral neuropathy -Resume home pregabalin  DVT prophylaxis: enoxaparin   Code Status: Full   Family Communication: husband at bedside   Disposition Plan: home   Consults called:   Admission status: IP Time spent: 70 mins  Level of care: Med-Surg Standley Dakins MD [facility] How to contact the [facility] Attending or Consulting provider 7A - 7P or covering provider during after  hours 7P -7A, for this patient?  Check the care team in [facility] and look for a) attending/consulting [facility] provider listed and b) the [facility] team listed Log into [email or web address] and use [place]'s universal password to access. If you do not have the password, please contact the hospital operator. Locate the [facility] provider you are looking for under [facility] and page to a number that you can be directly reached. If you still have difficulty reaching the provider, please page the [facility] (Director on Call) for the Hospitalists listed on amion for assistance.   If 7PM-7AM, please contact night-coverage [email or web address] Password [facility]  02/20/2024, 2:48 PM

## 2024-02-20 NOTE — Hospital Course (Addendum)
 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 Sherene Sires has her on prednisone BID that she does not always take regularly due to facial puffiness), hypercalcemia, depression and anxiety, bipolar disorder, type 2 diabetes mellitus, GERD, headaches, hyperlipidemia, hypertension, PTSD,'s history of seizures, history of head injury and left frontal meningioma reportedly had been treated for COPD exacerbation by her pulmonologist a few weeks ago and she never completely got better.  Her respiratory symptoms have worsened over the past several days and she called her pulmonologist office today porting that she is having terrible wheezing, coughing and gurgling sounds and labored breathing and they directed her to call 911 and come to the emergency department.  She was noted to have a pulse ox that was 77% on room air.  She reports that she normally uses 2 L of nasal cannula at night as needed.  She was placed on 4 L nasal cannula with some improvement in her oxygen saturation to 95%.  Her chest x-ray showed COPD but no clear infiltrate.  Her calcium was elevated at 11.3.  Her glucose was 143.  Her white blood cell count was 16.6.  Her hemoglobin was 14.1.  She was given some nebulizer treatments in the emergency department in addition to supplemental oxygenation however continued to have increasing work of breathing and admission was requested for treatment of COPD exacerbation.

## 2024-02-21 ENCOUNTER — Other Ambulatory Visit: Payer: Self-pay

## 2024-02-21 DIAGNOSIS — J441 Chronic obstructive pulmonary disease with (acute) exacerbation: Secondary | ICD-10-CM | POA: Diagnosis not present

## 2024-02-21 LAB — VITAMIN D 25 HYDROXY (VIT D DEFICIENCY, FRACTURES): Vit D, 25-Hydroxy: 33.11 ng/mL (ref 30–100)

## 2024-02-21 LAB — BASIC METABOLIC PANEL
Anion gap: 11 (ref 5–15)
BUN: 20 mg/dL (ref 8–23)
CO2: 31 mmol/L (ref 22–32)
Calcium: 11.1 mg/dL — ABNORMAL HIGH (ref 8.9–10.3)
Chloride: 98 mmol/L (ref 98–111)
Creatinine, Ser: 0.76 mg/dL (ref 0.44–1.00)
GFR, Estimated: >60 mL/min (ref >60)
Glucose, Bld: 183 mg/dL — ABNORMAL HIGH (ref 70–99)
Potassium: 4.6 mmol/L (ref 3.5–5.1)
Sodium: 140 mmol/L (ref 135–145)

## 2024-02-21 LAB — GLUCOSE, CAPILLARY
Glucose-Capillary: 195 mg/dL — ABNORMAL HIGH (ref 70–99)
Glucose-Capillary: 166 mg/dL — ABNORMAL HIGH (ref 70–99)

## 2024-02-21 LAB — ALBUMIN: Albumin: 3.8 g/dL (ref 3.5–5.0)

## 2024-02-21 LAB — HIV ANTIBODY (ROUTINE TESTING W REFLEX): HIV Screen 4th Generation wRfx: NONREACTIVE

## 2024-02-21 LAB — MAGNESIUM: Magnesium: 2.3 mg/dL (ref 1.7–2.4)

## 2024-02-21 MED ORDER — FUROSEMIDE 20 MG PO TABS: 20.0000 mg | ORAL_TABLET | Freq: Every day | ORAL | 11 refills | Status: DC

## 2024-02-21 MED ORDER — PREDNISONE 10 MG PO TABS: 40.0000 mg | ORAL_TABLET | Freq: Every day | ORAL | 0 refills | Status: AC

## 2024-02-21 NOTE — Care Management CC44 (Signed)
 Condition Code 44 Documentation Completed  Patient Details  Name: Benjamin Merrihew MRN: [id number] Date of Birth: 16-Aug-1956   Condition Code 44 given:  Yes Patient signature on Condition Code 44 notice:  Yes Documentation of 2 MD's agreement:  Yes Code 44 added to claim:  Yes    Leitha Bleak, RN 02/21/2024, 10:14 AM

## 2024-02-21 NOTE — Discharge Summary (Signed)
 Physician Discharge Summary  Vicki Blankenship WGN:[id number] DOB: [birth year] DOA: 02/20/2024  PCP: Nira Retort, FNP  Admit date: 02/20/2024  Discharge date: 02/21/2024  Admitted From: Home  Disposition: Home  Recommendations for Outpatient Follow-up:  Follow up with PCP in 1-2 weeks Follow-up with Dr. Sherene Sires as scheduled in the near future Follow-up with Dr. Ellin Saba regarding non-small cell right lung cancer Remain on prednisone taper as prescribed Continue to use albuterol nebulizer treatments as needed for shortness of breath or wheezing Changed HCTZ to Lasix given some hypercalcemia issues, continue to monitor on discharge, follow-up vitamin D levels Continue other home medications as prior  Home Health: None  Equipment/Devices: Has home 2 L nasal cannula oxygen  Discharge Condition:Stable  CODE STATUS: Full  Diet recommendation: Heart Healthy/carb modified  Brief/Interim Summary:  Vicki Blankenship is a 68 year old longtime smoker with history of 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 Sherene Sires has her on prednisone BID that she does not always take regularly due to facial puffiness), hypercalcemia, depression and anxiety, bipolar disorder, type 2 diabetes mellitus, GERD, headaches, hyperlipidemia, hypertension, PTSD,'s history of seizures, history of head injury and left frontal meningioma reportedly had been treated for COPD exacerbation by her pulmonologist a few weeks ago and she never completely got better.  Her respiratory symptoms have worsened over the past several days and she called her pulmonologist office today porting that she is having terrible wheezing, coughing and gurgling sounds and labored breathing and they directed her to call 911 and come to the emergency department.  Patient was admitted with acute on chronic hypoxemic respiratory failure in the  setting of COPD exacerbation.  She has had significant improvement overnight while on IV steroids as well as continue breathing treatments and is now in stable condition for discharge with no other acute events or concerns noted.  She was noted to have some mild hypercalcemia on lab work which was asymptomatic and is likely related to HCTZ use, however vitamin D levels are also pending.  This will need to be followed up with PCP and she has her HCTZ changed to Lasix at this time.  Discharge Diagnoses:  Principal Problem:   COPD with acute exacerbation (HCC) Active Problems:   Acute on chronic respiratory failure with hypoxia (HCC)   GERD (gastroesophageal reflux disease)   Hypercalcemia   Prediabetes   Cigarette smoker   COPD GOLD 2    Chronic respiratory failure with hypoxia and hypercapnia (HCC)   Multiple pulmonary nodules determined by computed tomography of lung   Non-small cell carcinoma of lung, right (HCC)   S/P Right Robotic Assisted Right Upper Lobe Wedge Resection and Right Middle Lobectomy   Squamous cell lung cancer, right (HCC)   HTN, goal below 130/80   HLD (hyperlipidemia)   Bipolar I disorder with depression (HCC)   Bradycardia   CAD (coronary artery disease)  Principal discharge diagnosis: Acute on chronic hypoxemic respiratory failure secondary to acute COPD exacerbation.  Discharge Instructions  Discharge Instructions     Diet - low sodium heart healthy   Complete by: As directed    Increase activity slowly   Complete by: As directed       Allergies as of 02/21/2024       Reactions   Penicillins Swelling   Tongue swelling   Wellbutrin [bupropion]    shakes        Medication List  STOP taking these medications    hydrochlorothiazide 25 MG tablet Commonly known as: HYDRODIURIL       TAKE these medications    albuterol 108 (90 Base) MCG/ACT inhaler Commonly known as: VENTOLIN HFA Inhale 1-2 puffs into the lungs every 6 (six) hours as  needed for wheezing or shortness of breath.   albuterol (2.5 MG/3ML) 0.083% nebulizer solution Commonly known as: PROVENTIL Take 3 mLs (2.5 mg total) by nebulization every 4 (four) hours as needed for wheezing or shortness of breath.   ARIPiprazole 10 MG tablet Commonly known as: ABILIFY Take 10 mg by mouth daily.   ascorbic acid 500 MG tablet Commonly known as: VITAMIN C Take 500 mg by mouth daily.   aspirin 81 MG chewable tablet Chew 81 mg by mouth daily.   atorvastatin 40 MG tablet Commonly known as: LIPITOR Take 40 mg by mouth daily.   Breztri Aerosphere 160-9-4.8 MCG/ACT Aero Generic drug: budeson-glycopyrrolate-formoterol Inhale 2 puffs into the lungs 2 (two) times daily.   clonazePAM 0.5 MG tablet Commonly known as: KLONOPIN Take 0.5 mg by mouth 3 (three) times daily.   DULoxetine 60 MG capsule Commonly known as: CYMBALTA Take 60 mg by mouth in the morning.   famotidine 20 MG tablet Commonly known as: Pepcid One after supper   furosemide 20 MG tablet Commonly known as: Lasix Take 1 tablet (20 mg total) by mouth daily.   guaiFENesin 600 MG 12 hr tablet Commonly known as: MUCINEX Take 1,200 mg by mouth 2 (two) times daily.   lithium carbonate 300 MG capsule Take 300 mg by mouth in the morning and at bedtime.   losartan 25 MG tablet Commonly known as: COZAAR Take 25 mg by mouth daily.   metFORMIN 500 MG tablet Commonly known as: GLUCOPHAGE Take 500 mg by mouth daily with breakfast.   mirtazapine 15 MG tablet Commonly known as: REMERON Take 15 mg by mouth at bedtime.   MULTIVITAMIN ADULT PO Take 1 tablet by mouth daily.   Narcan 4 MG/0.1ML Liqd nasal spray kit Generic drug: naloxone Place 1 spray into the nose as needed (opioid overdose).   OXYGEN Inhale 2 L into the lungs continuous.   pantoprazole 40 MG tablet Commonly known as: PROTONIX Take 1 tablet (40 mg total) by mouth daily before breakfast.   predniSONE 10 MG tablet Commonly known  as: DELTASONE Take 4 tablets (40 mg total) by mouth daily for 5 days.   pregabalin 75 MG capsule Commonly known as: LYRICA TAKE ONE CAPSULE BY MOUTH TWICE DAILY   Vitamin D (Cholecalciferol) 25 MCG (1000 UT) Tabs Take 1,000 Units by mouth daily.        Follow-up Information     Nira Retort, FNP. Schedule an appointment as soon as possible for a visit in 1 week(s).   Specialty: Family Medicine Contact information: [address] [place] [address] [phone]         Nyoka Cowden, MD. Schedule an appointment as soon as possible for a visit today.   Specialty: Pulmonary Disease Contact information: [address] [place] [address] [phone]                Allergies  Allergen Reactions   Penicillins Swelling    Tongue swelling   Wellbutrin [Bupropion]     shakes    Consultations: None   Procedures/Studies: DG Chest Port 1 View Result Date: 02/20/2024 CLINICAL DATA:  Shortness of breath. History of non-small-cell lung cancer. EXAM:  PORTABLE CHEST 1 VIEW COMPARISON:  X-ray 10/11/2022.  CT 11/27/2023. FINDINGS: No consolidation, pneumothorax or effusion. There is some linear opacity lung bases likely scar or atelectasis. Normal cardiopericardial silhouette. Overlapping cardiac leads. Tortuous and ectatic aorta. Linear changes at the right upper lobe consistent with prior surgical intervention as per history and prior CT. Overall diffuse chronic lung changes. IMPRESSION: Chronic changes.  No acute cardiopulmonary disease. Electronically Signed   By: Karen Kays M.D.   On: 02/20/2024 13:59     Discharge Exam: Vitals:   02/21/24 0746 02/21/24 0749  BP:    Pulse:    Resp:    Temp:    SpO2: 92% 92%   Vitals:   02/21/24 0433 02/21/24 0742 02/21/24 0746 02/21/24 0749  BP: (!) 159/94     Pulse: 79     Resp: 18     Temp: 97.7 F (36.5 C)     TempSrc: Oral     SpO2: 93% 92% 92% 92%    General: Pt is alert, awake, not in acute  distress Cardiovascular: RRR, S1/S2 +, no rubs, no gallops Respiratory: CTA bilaterally, no wheezing, no rhonchi, 2 L nasal cannula Abdominal: Soft, NT, ND, bowel sounds + Extremities: no edema, no cyanosis    The results of significant diagnostics from this hospitalization (including imaging, microbiology, ancillary and laboratory) are listed below for reference.     Microbiology: No results found for this or any previous visit (from the past 240 hours).   Labs: BNP (last 3 results) No results for input(s): "BNP" in the last 8760 hours. Basic Metabolic Panel: Recent Labs  Lab 02/20/24 1114 02/21/24 0420  NA 141 140  K 3.8 4.6  CL 96* 98  CO2 33* 31  GLUCOSE 143* 183*  BUN 13 20  CREATININE 0.72 0.76  CALCIUM 11.3* 11.1*  MG  --  2.3   Liver Function Tests: Recent Labs  Lab 02/20/24 1355 02/21/24 0420  ALBUMIN 3.7 3.8   No results for input(s): "LIPASE", "AMYLASE" in the last 168 hours. No results for input(s): "AMMONIA" in the last 168 hours. CBC: Recent Labs  Lab 02/20/24 1114  WBC 16.6*  HGB 14.1  HCT 46.3*  MCV 98.3  PLT 252   Cardiac Enzymes: No results for input(s): "CKTOTAL", "CKMB", "CKMBINDEX", "TROPONINI" in the last 168 hours. BNP: Invalid input(s): "POCBNP" CBG: Recent Labs  Lab 02/20/24 1701 02/20/24 2001 02/21/24 0433 02/21/24 0748  GLUCAP 304* 388* 195* 166*   D-Dimer No results for input(s): "DDIMER" in the last 72 hours. Hgb A1c Recent Labs    02/20/24 1114  HGBA1C 6.4*   Lipid Profile No results for input(s): "CHOL", "HDL", "LDLCALC", "TRIG", "CHOLHDL", "LDLDIRECT" in the last 72 hours. Thyroid function studies No results for input(s): "TSH", "T4TOTAL", "T3FREE", "THYROIDAB" in the last 72 hours.  Invalid input(s): "FREET3" Anemia work up No results for input(s): "VITAMINB12", "FOLATE", "FERRITIN", "TIBC", "IRON", "RETICCTPCT" in the last 72 hours. Urinalysis    Component Value Date/Time   COLORURINE YELLOW  07/25/2022 2145   APPEARANCEUR CLEAR 07/25/2022 2145   LABSPEC >1.046 (H) 07/25/2022 2145   PHURINE 7.0 07/25/2022 2145   GLUCOSEU NEGATIVE 07/25/2022 2145   HGBUR MODERATE (A) 07/25/2022 2145   BILIRUBINUR NEGATIVE 07/25/2022 2145   KETONESUR NEGATIVE 07/25/2022 2145   PROTEINUR NEGATIVE 07/25/2022 2145   NITRITE NEGATIVE 07/25/2022 2145   LEUKOCYTESUR NEGATIVE 07/25/2022 2145   Sepsis Labs Recent Labs  Lab 02/20/24 1114  WBC 16.6*   Microbiology No results found for  this or any previous visit (from the past 240 hours).   Time coordinating discharge: 35 minutes  SIGNED:   Erick Blinks, DO [facility] 02/21/2024, 9:41 AM  If 7PM-7AM, please contact night-coverage [email or web address]

## 2024-02-21 NOTE — Progress Notes (Signed)
 Patient has discharge orders, discharge teaching given and no further questions at this time. Pt wheeled down to main lobby via w/c by staff to vehicle accompanied by husband.

## 2024-02-21 NOTE — Care Management Obs Status (Signed)
 MEDICARE OBSERVATION STATUS NOTIFICATION   Patient Details  Name: Vicki Blankenship MRN: [id number] Date of Birth: [birth year]   Medicare Observation Status Notification Given:  Yes    Leitha Bleak, RN 02/21/2024, 10:13 AM

## 2024-02-25 NOTE — Progress Notes (Unsigned)
 Vicki Blankenship, female    DOB: 04-06-1956    MRN: [id number]   Brief patient profile:  58 yowf  quit smoking 02/2024   from [place]  grew up in house with smokers/ dx freq bronchitis/ear infections referred to pulmonary clinic in [place]  04/18/2022 by Dr Durene Cal.  Daily symptoms since 2011 placed on advair > thrush and since around 2012 just on albuterol and 02 hs since 2010    History of Present Illness  04/18/2022  Pulmonary/ 1st office eval/ [facility] / [place] [facility]  Chief Complaint  Patient presents with   Consult    Hx of COPD diagnosed around 2010.   Dyspnea:  [employer]/ chest tightness walking to mailbox x years 200 ft uphill  to mailbox  Cough: worse since pna dec 2022 esp first thing worse x sev hours x brownish slt bloody mucus and epistaxis since jan 2023  Sleep: on 02 2lpm / flat bed 2 pillows   SABA use: once daily at most but very poor hfa 0 2  2lpm hs but not with activity  Rec We will call to get your last pft from [place] if available  Plan A = Automatic = Always=    Symbicort  80 (for Breztri) Take 2 puffs first thing in am and then another 2 puffs about 12 hours later.   Work on inhaler technique:  Zpak should improve your cough  Plan B = Backup (to supplement plan A, not to replace it) Only use your albuterol inhaler as a rescue medication Make sure you check your oxygen saturation  AT  your highest level of activity (not after you stop)   to be sure it stays over 90%  Suggested e-cigs as an optional  "one way bridge"  Off all tobacco products    CT chest 04/18/22  LDSCT Lung-RADS 4B, suspicious. Additional imaging evaluation or consultation with Pulmonology or Thoracic Surgery recommended. Right middle and right upper lobe pulmonary nodules, suspicious for primary bronchogenic carcinoma or carcinomas. Consider PET of the right middle lobe nodule. The right upper lobe nodule is at the low end of PET resolution. 2. No thoracic adenopathy. 3. Age  advanced coronary artery atherosclerosis. Recommend assessment of coronary risk factors. 4. Aortic atherosclerosis (ICD10-I70.0) and emphysema (ICD10-J43.9).  PET 06/02/22  1. Hypermetabolic medial right middle lobe nodule, high suspicion for malignancy. 2. The 6 by 4 mm right upper lobe nodule is not discernibly Hypermetabolic  3. Borderline enlarged right lower paratracheal lymph node has a maximum SUV of 2.4, equal to the blood pool. This tends to favor benign process.   06/06/2022  f/u ov/[place] [facility] Truett re: GOLD 0 copd/ RML nodule  maint on nothing since had to stop symbicort 80  due to mouth irritation   Chief Complaint  Patient presents with   Follow-up    Wants to talk about pet scan done on 6/29  Dyspnea:  [employer] shopping / uses [facility] parking due to  back problems  Cough: mucus is still green in am assoc with "terrible sinuses"  Sleeping: 2lpm flat bed coughing disturbs  SABA use: not using  02: 2lpm hs  Covid status: vax 5 / never infected  Rec Doxycycline 100 mg twice daily x 10 daily  Plan A = Automatic = Always=    Bevespi 2 puffs 1st thing in am and 12 hours later Work on inhaler technique:    Plan B = Backup (to supplement plan A, not to replace it)  Only use your albuterol inhaler as a rescue medication  For cough > mucinex dm 1200 mg every hours as needed  Prednisone 10 mg take  4 each am x 2 days,   2 each am x 2 days,  1 each am x 2 days and stop   S/p robotic assisted right middle lobectomy and wedge resection of the right upper lobe nodule on 07/25/2022.  The right upper lobe nodule showed adenomatous hyperplasia.  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     03/29/2023  f/u ov/[place] office/Mistie Adney re: GOLD 2  on no maint rx  and still smoking  No chief complaint on file. Dyspnea:  still walking puppy 50 ft and starts coughing then sob  Cough: discolored worse  in am  Sleeping: bed is flat 2 pillows  SABA use: every few hours since not on maint rx  02: 2lpm hs  and prn  Continues with cp now generalized ant and worse with coughing  Rec For cough / congestion > mucinex dm 1200 mg every 12 hours as needed and use your flutter valve as much as possible  For nasty mucus >  doxycycline 100 mg twice daily x 7 days (refillable)  Work on inhaler technique:  Plan A = Automatic = Always=   Breztri Take 2 puffs first thing in am and then another 2 puffs about 12 hours later.  Work on inhaler technique:  Plan B = Backup (to supplement plan A, not to replace it) Only use your albuterol inhaler as a rescue medication Plan C = Crisis (instead of Plan B but only if Plan B stops working) - only use your albuterol nebulizer if you first try Plan B  Plan D = Deltasone Prednisone 10 mg take  4 each am x 2 days,   2 each am x 2 days,  1 each am x 2 days and stop (refillable) Make sure you check your oxygen saturation  AT  your highest level of activity (not after you stop)   to be sure it stays over 90%  Please schedule a follow up visit in 3 months but call sooner if needed  with all RESPIRATORY  medications /inhalers/ solutions in hand    06/28/2023  f/u ov/[place] office/Adalina Dopson re: GOLD 2 copd/ MPNs/02 dep hs and prn  maint on bevespi maint on breztri  did not bring meds/ has stopped smoking   Chief Complaint  Patient presents with   COPD    Gold 2  Dyspnea:  still doing puppy walk s 02  Cough: worse x  2 weeks, already took pred/doxy >better but sill waking her up/ using flutter but not mucienx dm as rec > dark green along with nasal d/c Sleeping: flat bed 2 pillows   SABA use: no increase  02: 2lpm  and rarely daytime  Rec For cough / congestion > mucinex dm 1200 mg every 12 hours as needed and use your flutter valve as much as possible and if not improving > prednisone x 6 days (refillable)    01/31/2024  f/u ov/[place] office/Deanta Mincey re: GOLD 2 copd/  MPNs/02 dep hs maint on breztri/ down to one cigarette daily / last pred 2 weeks prior to OV   / protonix/pepcid  Chief Complaint  Patient presents with   Follow-up    Copd   Dyspnea:  still doing puppy walk/ run  Cough: mucinex  dm 1200  mg but not using flutter  Sleeping: flat bed one pillow    resp cc  SABA use: neb every 4 hours / not as much hfa  02: 2lpm hs and prn  Rec For cough / congestion > mucinex dm 1200 mg every 12 hours as needed and use your flutter valve as much as possible. Levaquin 500 mg daily x 10 days ( stop if you aches in joints, especially ankles)  Plan A = Automatic = Always=    breztri Take 2 puffs first thing in am and then another 2 puffs about 12 hours later.  work on inhaler technique:   Plan B = Backup (to supplement plan A, not to replace it) Only use your albuterol inhaler as a rescue medication   Plan C = Crisis (instead of Plan B but only if Plan B stops working) - only use your albuterol nebulizer if you first try Plan B   Plan D = Deltasone Prednisone 10 mg take  4 each am x 2 days,   2 each am x 2 days,  1 each am x 2 days and stop (refillable) Make sure you check your oxygen saturation  AT  your highest level of activity (not after you stop)   to be sure it stays over 90%  The key is to stop smoking completely before smoking completely stops yo Please schedule a follow up visit in 4  weeks but call sooner if needed  with all RESPIRATORY  medications /inhalers/ solutions in hand  Admit date: 02/20/2024 Discharge date: 02/21/2024 Equipment/Devices: Has home 2 L nasal cannula oxygen  Brief/Interim Summary: She had been treated for COPD exacerbation by her pulmonologist a few weeks ago and she never completely got better.  Her respiratory symptoms have worsened over the past several days and she called her pulmonologist office today porting that she is having terrible wheezing, coughing and gurgling sounds and labored breathing and they directed her to  call 911 and come to the emergency department.  Patient was admitted with acute on chronic hypoxemic respiratory failure in the setting of COPD exacerbation.  She has had significant improvement overnight while on IV steroids as well as continue breathing treatments and is now in stable condition for discharge with no other acute events or concerns noted.  She was noted to have some mild hypercalcemia on lab work which was asymptomatic and is likely related to HCTZ use, however vitamin D levels are also pending.  This will need to be followed up with PCP and she has her HCTZ changed to Lasix at this time.    Discharge Diagnoses:  Principal Problem:   COPD with acute exacerbation (HCC)   Acute on chronic respiratory failure with hypoxia (HCC)   GERD (gastroesophageal reflux disease)   Hypercalcemia   Prediabetes   Cigarette smoker   COPD GOLD 2    Chronic respiratory failure with hypoxia and hypercapnia (HCC)   Multiple pulmonary nodules determined by computed tomography of lung   Non-small cell carcinoma of lung, right (HCC)   S/P Right Robotic Assisted Right Upper Lobe Wedge Resection and Right Middle Lobectomy   Squamous cell lung cancer, right (HCC)   HTN, goal below 130/80   HLD (hyperlipidemia)   Bipolar I disorder with depression (HCC)   Bradycardia   CAD (coronary artery disease)   Principal discharge diagnosis: Acute on chronic hypoxemic respiratory failure secondary to acute COPD exacerbation.       02/28/2024  f/u ov/[place] office/Dawnielle Christiana re: GOLD  2 02 dep 24/7  maint on breztri  no longer smoking / last pred was 3//25/25 Chief Complaint  Patient presents with   Follow-up    4 week follow up    Dyspnea:  back to puppy walk x 15 min 2lpm /not monitoring on titrating  Cough: some flare in am prematurely >> clear mucus/ not using flutter as rec  Sleeping: flat bed 2 pillows resp cc  SABA use: still using neb twice daily  02: 2 lpm     No obvious day to day or daytime  variability or assoc   mucus plugs or hemoptysis or cp or chest tightness, subjective wheeze or overt sinus or hb symptoms.    Also denies any obvious fluctuation of symptoms with weather or environmental changes or other aggravating or alleviating factors except as outlined above   No unusual exposure hx or h/o childhood pna/ asthma or knowledge of premature birth.  Current Allergies, Complete Past Medical History, Past Surgical History, Family History, and Social History were reviewed in [employer] record.  ROS  The following are not active complaints unless bolded Hoarseness, sore throat, dysphagia, dental problems, itching, sneezing,  nasal congestion or discharge of excess mucus or purulent secretions, ear ache,   fever, chills, sweats, unintended wt loss or wt gain, classically pleuritic or exertional cp,  orthopnea pnd or arm/hand swelling  or leg swelling, presyncope, palpitations, abdominal pain, anorexia, nausea, vomiting, diarrhea  or change in bowel habits or change in bladder habits, change in stools or change in urine, dysuria, hematuria,  rash, arthralgias, visual complaints, headache, numbness, weakness or ataxia or problems with walking or coordination,  change in mood or  memory.        Current Meds  Medication Sig   albuterol (PROVENTIL) (2.5 MG/3ML) 0.083% nebulizer solution Take 3 mLs (2.5 mg total) by nebulization every 4 (four) hours as needed for wheezing or shortness of breath.   albuterol (VENTOLIN HFA) 108 (90 Base) MCG/ACT inhaler Inhale 1-2 puffs into the lungs every 6 (six) hours as needed for wheezing or shortness of breath.   ARIPiprazole (ABILIFY) 10 MG tablet Take 10 mg by mouth daily.   aspirin 81 MG chewable tablet Chew 81 mg by mouth daily.   atorvastatin (LIPITOR) 40 MG tablet Take 40 mg by mouth daily.   Budeson-Glycopyrrol-Formoterol (BREZTRI AEROSPHERE) 160-9-4.8 MCG/ACT AERO Inhale 2 puffs into the lungs 2 (two) times daily.    clonazePAM (KLONOPIN) 0.5 MG tablet Take 0.5 mg by mouth 3 (three) times daily.   DULoxetine (CYMBALTA) 60 MG capsule Take 60 mg by mouth in the morning.   famotidine (PEPCID) 20 MG tablet One after supper   furosemide (LASIX) 20 MG tablet Take 1 tablet (20 mg total) by mouth daily.   guaiFENesin (MUCINEX) 600 MG 12 hr tablet Take 1,200 mg by mouth 2 (two) times daily.   lithium carbonate 300 MG capsule Take 300 mg by mouth in the morning and at bedtime.   losartan (COZAAR) 25 MG tablet Take 25 mg by mouth daily.   metFORMIN (GLUCOPHAGE) 500 MG tablet Take 500 mg by mouth daily with breakfast.   mirtazapine (REMERON) 15 MG tablet Take 15 mg by mouth at bedtime.   Multiple Vitamin (MULTIVITAMIN ADULT PO) Take 1 tablet by mouth daily.   naloxone (NARCAN) 4 MG/0.1ML LIQD nasal spray kit Place 1 spray into the nose as needed (opioid overdose).   OXYGEN Inhale 2 L into the lungs continuous.   pantoprazole (  PROTONIX) 40 MG tablet Take 1 tablet (40 mg total) by mouth daily before breakfast.   pregabalin (LYRICA) 75 MG capsule TAKE ONE CAPSULE BY MOUTH TWICE DAILY   vitamin C (ASCORBIC ACID) 500 MG tablet Take 500 mg by mouth daily.   Vitamin D, Cholecalciferol, 25 MCG (1000 UT) TABS Take 1,000 Units by mouth daily.           Past Medical History:  Diagnosis Date   Anxiety    Arthritis    Bipolar disorder (HCC)    COPD (chronic obstructive pulmonary disease) (HCC)    Depression    Diabetes (HCC)    Type II   Diabetes mellitus, type II (HCC)    Dyspnea    GERD (gastroesophageal reflux disease)    Headache    migraines- 3 times a week   HLD (hyperlipidemia)    HTN (hypertension)    Pneumonia    PTSD (post-traumatic stress disorder)        Objective:     Wts  02/28/2024        151   01/31/2024       156  06/28/2023       146  02/02/2023       163  12/20/2022       159  08/16/2022       138   06/06/22 143 lb 3.2 oz (65 kg)  05/19/22 149 lb 14.6 oz (68 kg)  04/22/22 150 lb (68 kg)     Vital signs reviewed  02/28/2024  - Note at rest 02 sats  85% on RA   General appearance:    amb wf easily confused with details of care/ congested spont cough   HEENT : Oropharynx  clear       NECK :  without  apparent JVD/ palpable Nodes/TM    LUNGS: no acc muscle use,  Mild barrel  contour chest wall with bilateral  insp/ exp rhonchi and  without cough on insp or exp maneuvers  and mild  Hyperresonant  to  percussion bilaterally     CV:  RRR  no s3 or murmur or increase in P2, and no edema   ABD:  soft and nontender    MS:  Nl gait/ ext warm without deformities Or obvious joint restrictions  calf tenderness, cyanosis or clubbing     SKIN: warm and dry without lesions    NEURO:  alert, approp, nl sensorium with  no motor or cerebellar deficits apparent.         I personally reviewed images and agree with radiology impression as follows:  CXR:   portable 02/20/24 Chronic changes.  No acute cardiopulmonary disease.     Assessment

## 2024-02-28 ENCOUNTER — Encounter: Payer: Self-pay | Admitting: Internal Medicine

## 2024-02-28 ENCOUNTER — Ambulatory Visit: Payer: Medicare HMO | Admitting: Internal Medicine

## 2024-02-28 VITALS — BP 125/83 | HR 91 | Ht 64.0 in | Wt 151.6 lb

## 2024-02-28 DIAGNOSIS — J449 Chronic obstructive pulmonary disease, unspecified: Secondary | ICD-10-CM | POA: Diagnosis not present

## 2024-02-28 DIAGNOSIS — J9612 Chronic respiratory failure with hypercapnia: Secondary | ICD-10-CM | POA: Diagnosis not present

## 2024-02-28 DIAGNOSIS — J9611 Chronic respiratory failure with hypoxia: Secondary | ICD-10-CM

## 2024-02-28 NOTE — Assessment & Plan Note (Signed)
 [facility]03   On 03/01/22 =  31 but as high as 35 in 2001  -  04/18/2022   Walked on RA  x  3  lap(s) =  approx 450  ft  @ moderate pace, stopped due to end of study with lowest 02 sats 91% cc sob @ 3rd lap  - 06/06/2022   Walked on RA   x  3  lap(s) =  approx 450  ft  @ fast pace, stopped due to end of walk with lowest 02 sats 90% with sob on 3rd lap   -08/16/2022   Walked on RA  x  3  lap(s) =  approx 450  ft  @ mod pace, stopped due to end of study s sob with lowest 02 sats 96%   Again advised: Make sure you check your oxygen saturation  AT  your highest level of activity (not after you stop)   to be sure it stays over 90% and adjust  02 flow upward to maintain this level if needed but remember to turn it back to previous settings when you stop (to conserve your supply).    Please schedule a follow up visit in 3 months with all meds in hand using a trust but verify approach to confirm accurate Medication  Reconciliation The principal here is that until we are certain that the  patients are doing what we've asked, it makes no sense to ask them to do more.          Each maintenance medication was reviewed in detail including emphasizing most importantly the difference between maintenance and prns and under what circumstances the prns are to be triggered using an action plan format where appropriate.  Total time for H and P, chart review, counseling, reviewing hfa/ neb/ 02/ pulse ox  device(s) and generating customized AVS unique to this office visit / same day charting = 35 min post hosp f/u

## 2024-02-28 NOTE — Patient Instructions (Addendum)
 For cough / congestion > mucinex dm 1200 mg every 12 hours as needed and use your flutter valve as much as possible.  Plan A = Automatic = Always=    breztri Take 2 puffs first thing in am and then another 2 puffs about 12 hours later.    Work on inhaler technique:  relax and gently blow all the way out then take a nice smooth full deep breath back in, triggering the inhaler at same time you start breathing in.  Hold breath in for at least  5 seconds if you can. Blow out breztri thru nose. Rinse and gargle with water when done.  If mouth or throat bother you at all,  try brushing teeth/gums/tongue with arm and hammer toothpaste/ make a slurry and gargle and spit out.     Plan B = Backup (to supplement plan A, not to replace it) Only use your albuterol inhaler as a rescue medication to be used if you can't catch your breath by resting or doing a relaxed purse lip breathing pattern.  - The less you use it, the better it will work when you need it. - Ok to use the inhaler up to 2 puffs  every 4 hours if you must but call for appointment if use goes up over your usual need - Don't leave home without it !!  (think of it like the spare tire for your car)   Plan C = Crisis (instead of Plan B but only if Plan B stops working) - only use your albuterol nebulizer if you first try Plan B and it fails to help > ok to use the nebulizer up to every 4 hours but if start needing it regularly call for immediate appointment   Plan D = Deltasone Prednisone 10 mg take  4 each am x 2 days,   2 each am x 2 days,  1 each am x 2 days and stop (refillable)  Make sure you check your oxygen saturation  AT  your highest level of activity (not after you stop)   to be sure it stays over 90% and adjust  02 flow upward to maintain this level if needed but remember to turn it back to previous settings when you stop (to conserve your supply).    Please schedule a follow up visit in 3 months but call sooner if needed - bring  inhalers [

## 2024-02-28 NOTE — Assessment & Plan Note (Signed)
 Quit smoking 02/2024  - PFTs 06/04/15 no significant obstruction/ ERV 20% at wt 189 / nl dlco  - Holter 01/22/16 x 24 h  Nl but no symptoms reported during monitor - 04/18/2022  After extensive coaching inhaler device,  effectiveness =    50% > try symbicort 80 2bid due to thrush on advair previously plus approp saba > could not tolerate oral side effects of ics - 06/06/2022   > try Bevespi 2 bid and practice with empty Breztri container - PFT's  07/05/22  FEV1 1.86 (76 % ) ratio 0.65  p 7 % improvement from saba p BREO prior to study with DLCO  14.95 (75%)   and FV curve mild concavity     12/20/2022    try bevespi  - 03/29/2023    flaring with aecopd >>> change to breztri plus added doxy and pred x 6  as part of action plan  - 06/28/2023  After extensive coaching inhaler device,  effectiveness =    80% hfa > continue breztri /flutter with mucinex dm for flares  - 02/28/2024  After extensive coaching inhaler device,  effectiveness =    60%  (short Ti) continue breztri/approp saba and  flutter and pred as plan D    Group D (now reclassified as E) in terms of symptom/risk and laba/lama/ICS  therefore appropriate rx at this point >>>  breztri as above but work harder on hfa technique and use the flutter valve more frequently  - advised her that the CB component of the problem should gradually improve if she maintains cig abstinence.  F/u

## 2024-03-29 ENCOUNTER — Ambulatory Visit (HOSPITAL_COMMUNITY)
Admission: RE | Admit: 2024-03-29 | Discharge: 2024-03-29 | Disposition: A | Discharge status: Home / Self Care | Payer: Medicare HMO | Source: Ambulatory Visit | Attending: Hematology | Admitting: Hematology

## 2024-03-29 DIAGNOSIS — C3491 Malignant neoplasm of unspecified part of right bronchus or lung: Secondary | ICD-10-CM | POA: Diagnosis present

## 2024-03-29 MED ORDER — IOHEXOL 300 MG/ML  SOLN
75.0000 mL | Freq: Once | INTRAMUSCULAR | Status: AC | PRN
  Administered 2024-03-29: 75 mL via INTRAVENOUS

## 2024-04-04 ENCOUNTER — Inpatient Hospital Stay: Payer: Medicare HMO | Attending: Hematology | Admitting: Hematology

## 2024-04-04 VITALS — BP 116/60 | HR 78 | Temp 97.1°F | Resp 20 | Wt 164.0 lb

## 2024-04-04 DIAGNOSIS — Z808 Family history of malignant neoplasm of other organs or systems: Secondary | ICD-10-CM | POA: Diagnosis not present

## 2024-04-04 DIAGNOSIS — Z803 Family history of malignant neoplasm of breast: Secondary | ICD-10-CM | POA: Insufficient documentation

## 2024-04-04 DIAGNOSIS — F17210 Nicotine dependence, cigarettes, uncomplicated: Secondary | ICD-10-CM | POA: Diagnosis not present

## 2024-04-04 DIAGNOSIS — Z79899 Other long term (current) drug therapy: Secondary | ICD-10-CM | POA: Insufficient documentation

## 2024-04-04 DIAGNOSIS — C3491 Malignant neoplasm of unspecified part of right bronchus or lung: Secondary | ICD-10-CM | POA: Diagnosis not present

## 2024-04-04 DIAGNOSIS — Z8042 Family history of malignant neoplasm of prostate: Secondary | ICD-10-CM | POA: Insufficient documentation

## 2024-04-04 DIAGNOSIS — Z8052 Family history of malignant neoplasm of bladder: Secondary | ICD-10-CM | POA: Diagnosis not present

## 2024-04-04 DIAGNOSIS — G629 Polyneuropathy, unspecified: Secondary | ICD-10-CM | POA: Diagnosis not present

## 2024-04-04 DIAGNOSIS — Z85118 Personal history of other malignant neoplasm of bronchus and lung: Secondary | ICD-10-CM | POA: Insufficient documentation

## 2024-04-04 DIAGNOSIS — Z902 Acquired absence of lung [part of]: Secondary | ICD-10-CM | POA: Diagnosis not present

## 2024-04-04 MED ORDER — CYCLOBENZAPRINE HCL 10 MG PO TABS: 10.0000 mg | ORAL_TABLET | Freq: Three times a day (TID) | ORAL | 0 refills | Status: DC | PRN

## 2024-04-04 NOTE — Progress Notes (Signed)
 [facility] [address] [place] [address]    Clinic Day:  04/04/2024  Referring physician: Evie Hoff, FNP  Patient Care Team: Vicki Hoff, FNP as PCP - General (Family Medicine) Vicki Pointer, MD as PCP - Cardiology (Cardiology) Vicki Cheadle Windsor Hatcher, MD as Consulting Physician (Gastroenterology) Vicki Boros, MD as Medical Oncologist (Medical Oncology) Vicki Knuckles, RN as Oncology Nurse Navigator (Medical Oncology) Vicki Formica, MD as Consulting Physician (Pulmonary Disease)   ASSESSMENT & PLAN:   Assessment: 1.  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 - PET (06/02/2022): Right middle lobe nodule 1.3 x 0.7 cm, SUV 4.8.  6 x 4 mm right upper lobe subpleural nodule with no activity.  1 cm short axis lower paratracheal node, SUV 2.4. - Right Middle lobectomy, lymph node biopsy, right upper lobe wedge resection on 07/25/2022 - Pathology: 1.5 x 1.1 x 1 cm squamous cell carcinoma, margins negative, negative visceral pleural involvement, lymph node stations 4R, 7, 9, 11, 12 negative for metastatic disease.  IHC positive for CK5/6, p63 and negative for TTF-1.  Ki-67 shows increased proliferation.   2.  Social/family history: - She lives at home with her husband.  Quit smoking on 07/24/2022.  She uses oxygen 2 L at bedtime.  Smoked 1 pack/day for 50 years.  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 - Father died of metastatic bladder cancer.  Mother had breast cancer.  2 paternal uncles had brain cancer and prostate cancer.    Plan: 1.  Stage I (T1b N0 M0) squamous cell carcinoma of the right middle lobe of the lung: - She does not report any chest pains or recent infections.  No hemoptysis. - CT chest (03/29/2024): No evidence of recurrence.  Stable precarinal and small mediastinal lymph nodes. -  Recommend follow-up in 6 months with repeat CT chest. - She reports pain in her right side shoulder, neck area which is chronic with flares.  Will give her temporary supply of Flexeril  10 mg #30.  She was told to contact her orthopedic doctor if the pain does not improve.   2.  Peripheral neuropathy (feet worse than hands): - Continue Lyrica  75 mg twice daily.    Orders Placed This Encounter  Procedures   CT CHEST W CONTRAST    Standing Status:   Future    Expected Date:   10/05/2024    Expiration Date:   04/04/2025    If indicated for the ordered procedure, I authorize the administration of contrast media per Radiology protocol:   Yes    Does the patient have a contrast media/X-ray dye allergy?:   No    Preferred imaging location?:   [facility] R Vicki Blankenship,acting as a scribe for Vicki Boros, MD.,have documented all relevant documentation on the behalf of Vicki Boros, MD,as directed by  Vicki Boros, MD while in the presence of Vicki Boros, MD.  I, Vicki Boros MD, have reviewed the above documentation for accuracy and completeness, and I agree with the above.    Vicki Boros, MD   5/1/20251:12 PM  CHIEF COMPLAINT:   Diagnosis: stage I right lung squamous cell carcinoma    Cancer Staging  Squamous cell lung cancer, right (HCC) Staging form: Lung, AJCC 8th Edition - Clinical stage from 08/29/2022: Stage IA2 (cT1b,  cN0, cM0) - Signed by Vicki Boros, MD on 08/29/2022    Prior Therapy: Right middle lobectomy, lymph node biopsy, right upper lobe wedge resection on 07/25/2022   Current Therapy:  Surveillance    HISTORY OF PRESENT ILLNESS:   Oncology History  Squamous cell lung cancer, right (HCC)  08/29/2022 Initial Diagnosis   Squamous cell lung cancer, right (HCC)   08/29/2022 Cancer Staging   Staging form: Lung, AJCC 8th Edition - Clinical stage from 08/29/2022: Stage IA2 (cT1b, cN0, cM0) - Signed by  Vicki Boros, MD on 08/29/2022 Histopathologic type: Squamous cell carcinoma, keratinizing, NOS Stage prefix: Initial diagnosis      INTERVAL HISTORY:   Vicki Blankenship is a 68 y.o. female seen for follow-up of her stage I right lung squamous cell carcinoma.  She was last seen by me on 12/04/23.  Since her last visit, she underwent CT chest on 03/29/24 that found: Stable precarinal lymph node enlargement. Other small nodes in the mediastinum are also stable. Stable surgical changes in the right lung. Stable small area of nodularity in the middle lobe. The areas of opacity and nodularity in the left lung on the prior examination have improved and may have been infectious or inflammatory. There are areas of bronchial wall thickening along both lower lobes. Some mucous plugging as well in the left lower lobe.  Vicki Blankenship was admitted to the hospital from 02/20/24 to 02/21/24 for COPD exacerbation, treated with IV steroids.  Today, she states that she is doing well overall. Her appetite level is at 100%. Her energy level is at 50%. Her husband has recently diagnosed with a GIST and will have it removed on 04/16/24.   [name] reports shoulder, neck, and back pain. She has been taking Flexeril  10 mg and would like to be prescribed this for the pain as it improves symptoms. She is taking Lyrica  as prescribed.   PAST MEDICAL HISTORY:   Past Medical History: Past Medical History:  Diagnosis Date   Anemia    Anxiety    Aortic atherosclerosis (HCC)    Arthritis    Bipolar disorder (HCC)    Chronic bronchitis with COPD (chronic obstructive pulmonary disease) (HCC)    COPD (chronic obstructive pulmonary disease) (HCC)    Depression    Diabetes (HCC)    Type II   Diabetes mellitus, type II (HCC)    Dyspnea    GERD (gastroesophageal reflux disease)    Head injury    Headache    migraines- 3 times a week   HLD (hyperlipidemia)    HTN (hypertension)    Lesion of lung 2023   suspect lung cancer per pt   LOC  (loss of consciousness) (HCC)    Meningioma (HCC)    L frontal   Pneumonia    PTSD (post-traumatic stress disorder)    Seizure (HCC)    hx of    Surgical History: Past Surgical History:  Procedure Laterality Date   BIOPSY  05/23/2022   Procedure: BIOPSY;  Surgeon: Vinetta Greening, DO;  [facility];  Service: Endoscopy;;   CARPAL TUNNEL RELEASE Right    CHOLECYSTECTOMY  2013   COLONOSCOPY  2015   In [place] .  Per patient, she had colon polyps.   COLONOSCOPY WITH PROPOFOL  N/A 05/23/2022   Procedure: COLONOSCOPY WITH PROPOFOL ;  Surgeon: Vinetta Greening, DO;  [facility]: [facility];  Service: Endoscopy;  Laterality: N/A;  11:00am   ESOPHAGOGASTRODUODENOSCOPY (EGD) WITH PROPOFOL  N/A 05/23/2022   Procedure: ESOPHAGOGASTRODUODENOSCOPY (EGD)  WITH PROPOFOL ;  Surgeon: Vinetta Greening, DO;  [facility];  Service: Endoscopy;  Laterality: N/A;   FOOT SURGERY Left    INTERCOSTAL NERVE BLOCK Right 07/25/2022   Procedure: INTERCOSTAL NERVE BLOCK;  Surgeon: Zelphia Higashi, MD;  Location: [facility] OR;  Service: Thoracic;  Laterality: Right;   LUMBAR LAMINECTOMY/ DECOMPRESSION WITH MET-RX Left 04/14/2020   Procedure: Left Lumbar Four-Five Minimally invasive lumbar decompression;  Surgeon: Cannon Champion, MD;  Location: [facility] OR;  Service: Neurosurgery;  Laterality: Left;  Left Lumbar Four-Five Minimally invasive lumbar decompression   LYMPH NODE DISSECTION Right 07/25/2022   Procedure: LYMPH NODE DISSECTION;  Surgeon: Zelphia Higashi, MD;  Location: [facility] OR;  Service: Thoracic;  Laterality: Right;   POLYPECTOMY  05/23/2022   Procedure: POLYPECTOMY;  Surgeon: Vinetta Greening, DO;  [facility];  Service: Endoscopy;;   thumb surgery Left    pin placed   TUBAL LIGATION     VIDEO BRONCHOSCOPY WITH ENDOBRONCHIAL NAVIGATION N/A 07/25/2022   Procedure: VIDEO BRONCHOSCOPY WITH ENDOBRONCHIAL NAVIGATION;  Surgeon: Zelphia Higashi, MD;  Location: [facility] OR;   Service: Thoracic;  Laterality: N/A;    Social History: Social History   Socioeconomic History   Marital status: Married    Spouse name: Not on file   Number of children: 2   Years of education: Not on file   Highest education level: Not on file  Occupational History   Not on file  Tobacco Use   Smoking status: Every Day    Current packs/day: 0.00    Average packs/day: 0.5 packs/day for 40.0 years (20.0 ttl pk-yrs)    Types: Cigarettes    Start date: 07/15/1982    Last attempt to quit: 07/15/2022    Years since quitting: 1.7   Smokeless tobacco: Never  Vaping Use   Vaping status: Former  Substance and Sexual Activity   Alcohol use: Never   Drug use: Not Currently   Sexual activity: Yes  Other Topics Concern   Not on file  Social History Narrative   Lives with husband   Right handed   Caffeine : coffee 4 cups in AM, throughout day 2-3 sodas   Social Drivers of Health   Financial Resource Strain: Low Risk  (08/29/2022)   Overall Financial Resource Strain (CARDIA)    Difficulty of Paying Living Expenses: Not very hard  Food Insecurity: No Food Insecurity (02/20/2024)   Hunger Vital Sign    Worried About Running Out of Food in the Last Year: Never true    Ran Out of Food in the Last Year: Never true  Transportation Needs: No Transportation Needs (02/20/2024)   PRAPARE - Administrator, Civil Service (Medical): No    Lack of Transportation (Non-Medical): No  Physical Activity: Insufficiently Active (08/29/2022)   Exercise Vital Sign    Days of Exercise per Week: 3 days    Minutes of Exercise per Session: 30 min  Stress: Stress Concern Present (08/29/2022)   [employer] of Occupational Health - Occupational Stress Questionnaire    Feeling of Stress : To some extent  Social Connections: Moderately Integrated (02/20/2024)   Social Connection and Isolation Panel [NHANES]    Frequency of Communication with Friends and Family: Three times a week    Frequency of  Social Gatherings with Friends and Family: Three times a week    Attends Religious Services: Never    Active Member of Clubs or Organizations: Yes    Attends Club  or Organization Meetings: 1 to 4 times per year    Marital Status: Married  Catering manager Violence: Not At Risk (02/20/2024)   Humiliation, Afraid, Rape, and Kick questionnaire    Fear of Current or Ex-Partner: No    Emotionally Abused: No    Physically Abused: No    Sexually Abused: No    Family History: Family History  Problem Relation Age of Onset   Migraines Mother    Diabetes Mother    Dementia Mother    Cancer Father    Heart disease Father    Colon cancer Neg Hx     Current Medications:  Current Outpatient Medications:    albuterol  (PROVENTIL ) (2.5 MG/3ML) 0.083% nebulizer solution, Take 3 mLs (2.5 mg total) by nebulization every 4 (four) hours as needed for wheezing or shortness of breath., Disp: 75 mL, Rfl: 12   albuterol  (VENTOLIN  HFA) 108 (90 Base) MCG/ACT inhaler, Inhale 1-2 puffs into the lungs every 6 (six) hours as needed for wheezing or shortness of breath., Disp: 8 g, Rfl: 3   ARIPiprazole  (ABILIFY ) 10 MG tablet, Take 10 mg by mouth daily., Disp: , Rfl:    aspirin  81 MG chewable tablet, Chew 81 mg by mouth daily., Disp: , Rfl:    atorvastatin  (LIPITOR) 40 MG tablet, Take 40 mg by mouth daily., Disp: , Rfl:    Budeson-Glycopyrrol-Formoterol  (BREZTRI  AEROSPHERE) 160-9-4.8 MCG/ACT AERO, Inhale 2 puffs into the lungs 2 (two) times daily., Disp: 10.7 g, Rfl: 11   clonazePAM  (KLONOPIN ) 0.5 MG tablet, Take 0.5 mg by mouth 3 (three) times daily., Disp: , Rfl:    cyclobenzaprine  (FLEXERIL ) 10 MG tablet, Take 1 tablet (10 mg total) by mouth 3 (three) times daily as needed for muscle spasms., Disp: 30 tablet, Rfl: 0   DULoxetine  (CYMBALTA ) 60 MG capsule, Take 60 mg by mouth in the morning., Disp: , Rfl:    famotidine  (PEPCID ) 20 MG tablet, One after supper, Disp: 30 tablet, Rfl: 11   furosemide  (LASIX ) 20 MG  tablet, Take 1 tablet (20 mg total) by mouth daily., Disp: 30 tablet, Rfl: 11   guaiFENesin  (MUCINEX ) 600 MG 12 hr tablet, Take 1,200 mg by mouth 2 (two) times daily., Disp: , Rfl:    lithium  carbonate 300 MG capsule, Take 300 mg by mouth in the morning and at bedtime., Disp: , Rfl:    losartan  (COZAAR ) 25 MG tablet, Take 25 mg by mouth daily., Disp: , Rfl:    metFORMIN  (GLUCOPHAGE ) 500 MG tablet, Take 500 mg by mouth 2 (two) times daily., Disp: , Rfl:    mirtazapine  (REMERON ) 30 MG tablet, Take 30 mg by mouth at bedtime., Disp: , Rfl:    Multiple Vitamin (MULTIVITAMIN ADULT PO), Take 1 tablet by mouth daily., Disp: , Rfl:    naloxone (NARCAN) 4 MG/0.1ML LIQD nasal spray kit, Place 1 spray into the nose as needed (opioid overdose)., Disp: , Rfl:    OXYGEN, Inhale 2 L into the lungs continuous., Disp: , Rfl:    pantoprazole  (PROTONIX ) 40 MG tablet, Take 1 tablet (40 mg total) by mouth daily before breakfast., Disp: 90 tablet, Rfl: 3   pregabalin  (LYRICA ) 75 MG capsule, TAKE ONE CAPSULE BY MOUTH TWICE DAILY, Disp: 60 capsule, Rfl: 5   vitamin C  (ASCORBIC ACID ) 500 MG tablet, Take 500 mg by mouth daily., Disp: , Rfl:    Vitamin D , Cholecalciferol, 25 MCG (1000 UT) TABS, Take 1,000 Units by mouth daily., Disp: , Rfl:    Allergies: Allergies  Allergen Reactions   Penicillins Swelling    Tongue swelling   Wellbutrin [Bupropion]     shakes    REVIEW OF SYSTEMS:   Review of Systems  Constitutional:  Negative for chills, fatigue and fever.  HENT:   Negative for lump/mass, mouth sores, nosebleeds, sore throat and trouble swallowing.   Eyes:  Negative for eye problems.  Respiratory:  Positive for cough. Negative for shortness of breath.   Cardiovascular:  Positive for chest pain, leg swelling and palpitations.  Gastrointestinal:  Positive for blood in stool and nausea. Negative for abdominal pain, constipation, diarrhea and vomiting.  Genitourinary:  Negative for bladder incontinence, difficulty  urinating, dysuria, frequency, hematuria and nocturia.   Musculoskeletal:  Positive for back pain (8/10 severity). Negative for arthralgias, flank pain, myalgias and neck pain.       +bilateral ankle and foot pain, 8/10 severity  Skin:  Negative for itching and rash.  Neurological:  Positive for dizziness, headaches and numbness (in legs).  Hematological:  Does not bruise/bleed easily.  Psychiatric/Behavioral:  Positive for sleep disturbance. Negative for depression and suicidal ideas. The patient is not nervous/anxious.   All other systems reviewed and are negative.    VITALS:   Blood pressure 116/60, pulse 78, temperature (!) 97.1 F (36.2 C), temperature source Tympanic, resp. rate 20, weight 164 lb 0.4 oz (74.4 kg), SpO2 100%.  Wt Readings from Last 3 Encounters:  04/04/24 164 lb 0.4 oz (74.4 kg)  02/28/24 151 lb 9.6 oz (68.8 kg)  01/31/24 156 lb (70.8 kg)    Body mass index is 28.15 kg/m.  Performance status (ECOG): 1 - Symptomatic but completely ambulatory  PHYSICAL EXAM:   Physical Exam Vitals and nursing note reviewed. Exam conducted with a chaperone present.  Constitutional:      Appearance: Normal appearance.  Cardiovascular:     Rate and Rhythm: Normal rate and regular rhythm.     Pulses: Normal pulses.     Heart sounds: Normal heart sounds.  Pulmonary:     Effort: Pulmonary effort is normal.     Breath sounds: Normal breath sounds.  Abdominal:     Palpations: Abdomen is soft. There is no hepatomegaly, splenomegaly or mass.     Tenderness: There is no abdominal tenderness.  Musculoskeletal:     Right lower leg: No edema.     Left lower leg: No edema.  Lymphadenopathy:     Cervical: No cervical adenopathy.     Right cervical: No superficial, deep or posterior cervical adenopathy.    Left cervical: No superficial, deep or posterior cervical adenopathy.     Upper Body:     Right upper body: No supraclavicular or axillary adenopathy.     Left upper body: No  supraclavicular or axillary adenopathy.  Neurological:     General: No focal deficit present.     Mental Status: She is alert and oriented to person, place, and time.  Psychiatric:        Mood and Affect: Mood normal.        Behavior: Behavior normal.     LABS:      Latest Ref Rng & Units 02/20/2024   11:14 AM 11/27/2023    7:53 AM 05/23/2023    1:03 PM  CBC  WBC 4.0 - 10.5 K/uL 16.6  9.7  11.4   Hemoglobin 12.0 - 15.0 g/dL 16.1  09.6  04.5   Hematocrit 36.0 - 46.0 % 46.3  48.5  46.2   Platelets 150 -  400 K/uL 252  327  304       Latest Ref Rng & Units 02/21/2024    4:20 AM 02/20/2024   11:14 AM 11/27/2023    7:53 AM  CMP  Glucose 70 - 99 mg/dL 960  454  098   BUN 8 - 23 mg/dL 20  13  8    Creatinine 0.44 - 1.00 mg/dL 1.19  1.47  8.29   Sodium 135 - 145 mmol/L 140  141  139   Potassium 3.5 - 5.1 mmol/L 4.6  3.8  4.1   Chloride 98 - 111 mmol/L 98  96  101   CO2 22 - 32 mmol/L 31  33  32   Calcium  8.9 - 10.3 mg/dL 56.2  13.0  86.5   Total Protein 6.5 - 8.1 g/dL   8.0   Total Bilirubin <1.2 mg/dL   0.8   Alkaline Phos 38 - 126 U/L   90   AST 15 - 41 U/L   37   ALT 0 - 44 U/L   42      No results found for: "CEA1", "CEA" / No results found for: "CEA1", "CEA" No results found for: "PSA1" No results found for: "HQI696" No results found for: "CAN125"  No results found for: "TOTALPROTELP", "ALBUMINELP", "A1GS", "A2GS", "BETS", "BETA2SER", "GAMS", "MSPIKE", "SPEI" No results found for: "TIBC", "FERRITIN", "IRONPCTSAT" No results found for: "LDH"   STUDIES:   CT CHEST W CONTRAST Result Date: 04/01/2024 CLINICAL DATA:  Monitoring squamous cell lung cancer on the right. * Tracking Code: BO * EXAM: CT CHEST WITH CONTRAST TECHNIQUE: Multidetector CT imaging of the chest was performed during intravenous contrast administration. RADIATION DOSE REDUCTION: This exam was performed according to the departmental dose-optimization program which includes automated exposure control,  adjustment of the mA and/or kV according to patient size and/or use of iterative reconstruction technique. CONTRAST:  75mL OMNIPAQUE  IOHEXOL  300 MG/ML  SOLN COMPARISON:  CT 11/27/2023. FINDINGS: Cardiovascular: Thoracic aorta is normal course and caliber. Mild scattered calcified plaque. Coronary artery calcifications are seen. No pericardial effusion. Heart is nonenlarged. Previously the ascending aorta was measured at a diameter of 4 cm. Today 4 cm once again. Mediastinum/Nodes: Slightly patulous thoracic esophagus. Preserved thyroid gland. No specific abnormal lymph node enlargement identified in the axillary regions or hila. Once again there are some small scattered mediastinal nodes identified including paratracheal and prevascular. One larger node anterior to the carina which previously had short axis dimension of 13 mm, today on image 58 short axis of 12 mm. Similar. No new mediastinal nodal enlargement Lungs/Pleura: Again scattered emphysematous changes are seen. Surgical changes again identified at the right lung apex with suture line. Pleural thickening and scarring is identified. No new consolidation, pneumothorax or effusion. There are areas of bronchial wall thickening identified particularly along the right lower lobe. The ill-defined nodular area in the middle lobe which measured 4 mm on the prior examination, today on series 3, image 79 measures 5 mm. Not significantly changed. Previously there is some ill-defined areas of nodularity in the left lower lobe, tree-in-bud like changes which are no longer identified and may have been infectious or inflammatory. This includes the 12 mm ground-glass area identified in the lingula. Upper Abdomen: Adrenal glands are preserved in the upper abdomen. Mild fatty liver infiltration. Ectasia of the common duct is seen but the patient is status post cholecystectomy in this was present on the prior examination. Musculoskeletal: Scattered degenerative changes along  the spine. IMPRESSION:  Stable precarinal lymph node enlargement. Other small nodes in the mediastinum are also stable. Stable surgical changes in the right lung. Stable small area of nodularity in the middle lobe. The areas of opacity and nodularity in the left lung on the prior examination have improved and may have been infectious or inflammatory. There are areas of bronchial wall thickening along both lower lobes. Some mucous plugging as well in the left lower lobe. Aortic Atherosclerosis (ICD10-I70.0) and Emphysema (ICD10-J43.9). Electronically Signed   By: Adrianna Horde M.D.   On: 04/01/2024 15:33

## 2024-04-04 NOTE — Patient Instructions (Signed)
 [facility] at [facility] Discharge Instructions   You were seen and examined today by Dr. Ellin Saba.  He reviewed the results of your lab work which are normal/stable.   He reviewed the results of your CT scan which did not show any evidence of cancer.   We will see you back in 6 months. We will repeat lab work and a CT scan prior to this visit.    Return as scheduled.    Thank you for choosing [facility] at [facility] to provide your oncology and hematology care.  To afford each patient quality time with our provider, please arrive at least 15 minutes before your scheduled appointment time.   If you have a lab appointment with the Cancer Center please come in thru the Main Entrance and check in at the main information desk.  You need to re-schedule your appointment should you arrive 10 or more minutes late.  We strive to give you quality time with our providers, and arriving late affects you and other patients whose appointments are after yours.  Also, if you no show three or more times for appointments you may be dismissed from the clinic at the providers discretion.     Again, thank you for choosing [facility].  Our hope is that these requests will decrease the amount of time that you wait before being seen by our physicians.       _____________________________________________________________  Should you have questions after your visit to [facility], please contact our office at [phone] and follow the prompts.  Our office hours are 8:00 a.m. and 4:30 p.m. Monday - Friday.  Please note that voicemails left after 4:00 p.m. may not be returned until the following business day.  We are closed weekends and major holidays.  You do have access to a nurse 24-7, just call the main number to the clinic [phone] and do not press any options, hold on the line and a nurse will answer the phone.    For prescription  refill requests, have your pharmacy contact our office and allow 72 hours.    Due to Covid, you will need to wear a mask upon entering the hospital. If you do not have a mask, a mask will be given to you at the Main Entrance upon arrival. For doctor visits, patients may have 1 support person age 47 or older with them. For treatment visits, patients can not have anyone with them due to social distancing guidelines and our immunocompromised population.

## 2024-04-09 ENCOUNTER — Other Ambulatory Visit: Payer: Self-pay | Admitting: *Deleted

## 2024-04-09 MED ORDER — PREGABALIN 75 MG PO CAPS: 75.0000 mg | ORAL_CAPSULE | Freq: Two times a day (BID) | ORAL | 5 refills | Status: DC

## 2024-05-31 ENCOUNTER — Other Ambulatory Visit: Payer: Self-pay | Admitting: Internal Medicine

## 2024-06-02 NOTE — Progress Notes (Deleted)
 Beverley Jenkins Hals, female    DOB: 06-11-1956    MRN: [id number]   Brief patient profile:  65 yowf  quit smoking 02/2024   from [place]  grew up in house with smokers/ dx freq bronchitis/ear infections referred to pulmonary clinic in [place]  04/18/2022 by Dr Katrinka.  Daily symptoms since 2011 placed on advair > thrush and since around 2012 just on albuterol  and 02 hs since 2010    History of Present Illness  04/18/2022  Pulmonary/ 1st office eval/ [facility] / [facility]  Chief Complaint  Patient presents with   Consult    Hx of COPD diagnosed around 2010.   Dyspnea:  [address] [employer]/ chest tightness walking to mailbox x years 200 ft uphill  to mailbox  Cough: worse since pna dec 2022 esp first thing worse x sev hours x brownish slt bloody mucus and epistaxis since jan 2023  Sleep: on 02 2lpm / flat bed 2 pillows   SABA use: once daily at most but very poor hfa 0 2  2lpm hs but not with activity  Rec We will call to get your last pft from [facility] if available  Plan A = Automatic = Always=    Symbicort   80 (for Breztri ) Take 2 puffs first thing in am and then another 2 puffs about 12 hours later.   Work on inhaler technique:  Zpak should improve your cough  Plan B = Backup (to supplement plan A, not to replace it) Only use your albuterol  inhaler as a rescue medication Make sure you check your oxygen saturation  AT  your highest level of activity (not after you stop)   to be sure it stays over 90%  Suggested e-cigs as an optional  one way bridge  Off all tobacco products    CT chest 04/18/22  LDSCT Lung-RADS 4B, suspicious. Additional imaging evaluation or consultation with Pulmonology or Thoracic Surgery recommended. Right middle and right upper lobe pulmonary nodules, suspicious for primary bronchogenic carcinoma or carcinomas. Consider PET of the right middle lobe nodule. The right upper lobe nodule is at the low end of PET resolution. 2. No thoracic adenopathy. 3. Age  advanced coronary artery atherosclerosis. Recommend assessment of coronary risk factors. 4. Aortic atherosclerosis (ICD10-I70.0) and emphysema (ICD10-J43.9).  PET 06/02/22  1. Hypermetabolic medial right middle lobe nodule, high suspicion for malignancy. 2. The 6 by 4 mm right upper lobe nodule is not discernibly Hypermetabolic  3. Borderline enlarged right lower paratracheal lymph node has a maximum SUV of 2.4, equal to the blood pool. This tends to favor benign process.  - PFT's 07/05/22 FEV1 1.86 (76 % ) ratio 0.65 p 7 % improvement from saba p BREO prior to study with DLCO 14.95 (75%) and FV curve mild concavity   S/p robotic assisted right middle lobectomy and wedge resection of the right upper lobe nodule on 07/25/2022.  The right upper lobe nodule showed adenomatous hyperplasia.  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   03/29/2023  f/u ov/[place] office/Merrel Crabbe re: GOLD 2  on no maint rx  and still smoking  No chief complaint on file. Dyspnea:  still walking puppy 50 ft and starts coughing then sob  Cough: discolored worse in am  Sleeping: bed is flat 2 pillows  SABA use: every few hours since not on maint rx  02: 2lpm hs  and prn  Continues with  cp now generalized ant and worse with coughing  Rec For cough / congestion > mucinex  dm 1200 mg every 12 hours as needed and use your flutter valve as much as possible  For nasty mucus >  doxycycline  100 mg twice daily x 7 days (refillable)  Work on inhaler technique:  Plan A = Automatic = Always=   Breztri  Take 2 puffs first thing in am and then another 2 puffs about 12 hours later.  Work on inhaler technique:  Plan B = Backup (to supplement plan A, not to replace it) Only use your albuterol  inhaler as a rescue medication Plan C = Crisis (instead of Plan B but only if Plan B stops working) - only use your albuterol  nebulizer if you first try  Plan B  Plan D = Deltasone  Prednisone  10 mg take  4 each am x 2 days,   2 each am x 2 days,  1 each am x 2 days and stop (refillable) Make sure you check your oxygen saturation  AT  your highest level of activity (not after you stop)   to be sure it stays over 90%  Please schedule a follow up visit in 3 months but call sooner if needed  with all RESPIRATORY  medications /inhalers/ solutions in hand    06/28/2023  f/u ov/[place] office/Ashawn Rinehart re: GOLD 2 copd/ MPNs/02 dep hs and prn  maint on bevespi  maint on breztri   did not bring meds/ has stopped smoking   Chief Complaint  Patient presents with   COPD    Gold 2  Dyspnea:  still doing puppy walk s 02  Cough: worse x  2 weeks, already took pred/doxy >better but sill waking her up/ using flutter but not mucienx dm as rec > dark green along with nasal d/c Sleeping: flat bed 2 pillows   SABA use: no increase  02: 2lpm  and rarely daytime  Rec For cough / congestion > mucinex  dm 1200 mg every 12 hours as needed and use your flutter valve as much as possible and if not improving > prednisone  x 6 days (refillable)    01/31/2024  f/u ov/[place] office/Shavanna Furnari re: GOLD 2 copd/ MPNs/02 dep hs maint on breztri / down to one cigarette daily / last pred 2 weeks prior to OV   / protonix /pepcid   Chief Complaint  Patient presents with   Follow-up    Copd   Dyspnea:  still doing puppy walk/ run  Cough: mucinex   dm 1200 mg but not using flutter  Sleeping: flat bed one pillow    resp cc  SABA use: neb every 4 hours / not as much hfa  02: 2lpm hs and prn  Rec For cough / congestion > mucinex  dm 1200 mg every 12 hours as needed and use your flutter valve as much as possible. Levaquin  500 mg daily x 10 days ( stop if you aches in joints, especially ankles)  Plan A = Automatic = Always=    breztri  Take 2 puffs first thing in am and then another 2 puffs about 12 hours later.  work on inhaler technique:   Plan B = Backup (to supplement plan A, not to  replace it) Only use your albuterol  inhaler as a rescue medication   Plan C = Crisis (instead of Plan B but only if Plan B stops working) - only use your albuterol  nebulizer if you first try Plan B   Plan D = Deltasone  Prednisone  10 mg take  4 each am  x 2 days,   2 each am x 2 days,  1 each am x 2 days and stop (refillable) Make sure you check your oxygen saturation  AT  your highest level of activity (not after you stop)   to be sure it stays over 90%  The key is to stop smoking completely before smoking completely stops yo Please schedule a follow up visit in 4  weeks but call sooner if needed  with all RESPIRATORY  medications /inhalers/ solutions in hand  Admit date: 02/20/2024 Discharge date: 02/21/2024 Equipment/Devices: Has home 2 L nasal cannula oxygen  Brief/Interim Summary: She had been treated for COPD exacerbation by her pulmonologist a few weeks ago and she never completely got better.  Her respiratory symptoms have worsened over the past several days and she called her pulmonologist office today porting that she is having terrible wheezing, coughing and gurgling sounds and labored breathing and they directed her to call 911 and come to the emergency department.  Patient was admitted with acute on chronic hypoxemic respiratory failure in the setting of COPD exacerbation.  She has had significant improvement overnight while on IV steroids as well as continue breathing treatments and is now in stable condition for discharge with no other acute events or concerns noted.  She was noted to have some mild hypercalcemia on lab work which was asymptomatic and is likely related to HCTZ use, however vitamin D  levels are also pending.  This will need to be followed up with PCP and she has her HCTZ changed to Lasix  at this time.    Discharge Diagnoses:  Principal Problem:   COPD with acute exacerbation (HCC)   Acute on chronic respiratory failure with hypoxia (HCC)   GERD (gastroesophageal  reflux disease)   Hypercalcemia   Prediabetes   Cigarette smoker   COPD GOLD 2    Chronic respiratory failure with hypoxia and hypercapnia (HCC)   Multiple pulmonary nodules determined by computed tomography of lung   Non-small cell carcinoma of lung, right (HCC)   S/P Right Robotic Assisted Right Upper Lobe Wedge Resection and Right Middle Lobectomy   Squamous cell lung cancer, right (HCC)   HTN, goal below 130/80   HLD (hyperlipidemia)   Bipolar I disorder with depression (HCC)   Bradycardia   CAD (coronary artery disease)   Principal discharge diagnosis: Acute on chronic hypoxemic respiratory failure secondary to acute COPD exacerbation.       02/28/2024  f/u ov/[place] office/Geovonni Meyerhoff re: GOLD 2 02 dep 24/7  maint on breztri   no longer smoking / last pred was 3//25/25 Chief Complaint  Patient presents with   Follow-up    4 week follow up    Dyspnea:  back to puppy walk x 15 min 2lpm /not monitoring on titrating  Cough: some flare in am prematurely >> clear mucus/ not using flutter as rec  Sleeping: flat bed 2 pillows resp cc  SABA use: still using neb twice daily  02: 2 lpm  Rec For cough / congestion > mucinex  dm 1200 mg every 12 hours as needed and use your flutter valve as much as possible. Plan A = Automatic = Always=    breztri  Take 2 puffs first thing in am and then another 2 puffs about 12 hours later.  Work on inhaler technique:  Plan B = Backup (to supplement plan A, not to replace it) Only use your albuterol  inhaler as a rescue medication Plan C = Crisis (instead of Plan B but only  if Plan B stops working) - only use your albuterol  nebulizer if you first try Plan B  Plan D = Deltasone  Prednisone  10 mg take  4 each am x 2 days,   2 each am x 2 days,  1 each am x 2 days and stop (refillable) Make sure you check your oxygen saturation  AT  your highest level of activity (not after you stop)   to be sure it stays over 90% Please schedule a follow up visit in 3 months  but call sooner if needed - bring inhalers    06/04/2024  f/u ov/[place] office/Raimi Guillermo re: GOLD 2 copd/ 02 dep  maint on *** did *** bring inhalers  No chief complaint on file.   Dyspnea:  *** Cough: *** Sleeping: ***   resp cc  SABA use: *** 02: ***  Lung cancer screening: ***   No obvious day to day or daytime variability or assoc excess/ purulent sputum or mucus plugs or hemoptysis or cp or chest tightness, subjective wheeze or overt sinus or hb symptoms.    Also denies any obvious fluctuation of symptoms with weather or environmental changes or other aggravating or alleviating factors except as outlined above   No unusual exposure hx or h/o childhood pna/ asthma or knowledge of premature birth.  Current Allergies, Complete Past Medical History, Past Surgical History, Family History, and Social History were reviewed in [employer] record.  ROS  The following are not active complaints unless bolded Hoarseness, sore throat, dysphagia, dental problems, itching, sneezing,  nasal congestion or discharge of excess mucus or purulent secretions, ear ache,   fever, chills, sweats, unintended wt loss or wt gain, classically pleuritic or exertional cp,  orthopnea pnd or arm/hand swelling  or leg swelling, presyncope, palpitations, abdominal pain, anorexia, nausea, vomiting, diarrhea  or change in bowel habits or change in bladder habits, change in stools or change in urine, dysuria, hematuria,  rash, arthralgias, visual complaints, headache, numbness, weakness or ataxia or problems with walking or coordination,  change in mood or  memory.        No outpatient medications have been marked as taking for the 06/04/24 encounter (Appointment) with Darlean Ozell NOVAK, MD.          Past Medical History:  Diagnosis Date   Anxiety    Arthritis    Bipolar disorder (HCC)    COPD (chronic obstructive pulmonary disease) (HCC)    Depression    Diabetes (HCC)    Type II   Diabetes  mellitus, type II (HCC)    Dyspnea    GERD (gastroesophageal reflux disease)    Headache    migraines- 3 times a week   HLD (hyperlipidemia)    HTN (hypertension)    Pneumonia    PTSD (post-traumatic stress disorder)        Objective:     Wts  02/28/2024        151   01/31/2024       156  06/28/2023       146  02/02/2023       163  12/20/2022       159  08/16/2022       138   06/06/22 143 lb 3.2 oz (65 kg)  05/19/22 149 lb 14.6 oz (68 kg)  04/22/22 150 lb (68 kg)    Vital signs reviewed  06/04/2024  - Note at rest 02 sats  ***% on ***   General appearance:    ***  Mild bar***  Assessment

## 2024-06-04 ENCOUNTER — Encounter: Payer: Self-pay | Admitting: Internal Medicine

## 2024-06-04 ENCOUNTER — Ambulatory Visit: Admitting: Internal Medicine

## 2024-07-05 ENCOUNTER — Ambulatory Visit: Admitting: Internal Medicine

## 2024-07-05 NOTE — Progress Notes (Deleted)
 Vicki Blankenship, female    DOB: 1956-10-17    MRN: [id number]   Brief patient profile:  88 yowf  quit smoking 02/2024   from [place]  grew up in house with smokers/ dx freq bronchitis/ear infections referred to pulmonary clinic in [place]  04/18/2022 by Dr Katrinka.  Daily symptoms since 2011 placed on advair > thrush and since around 2012 just on albuterol  and 02 hs since 2010    History of Present Illness  04/18/2022  Pulmonary/ 1st office eval/ Shreyan Hinz / [place] [facility]  Chief Complaint  Patient presents with   Consult    Hx of COPD diagnosed around 2010.   Dyspnea:  [address] [employer]/ chest tightness walking to mailbox x years 200 ft uphill  to mailbox  Cough: worse since pna dec 2022 esp first thing worse x sev hours x brownish slt bloody mucus and epistaxis since jan 2023  Sleep: on 02 2lpm / flat bed 2 pillows   SABA use: once daily at most but very poor hfa 0 2  2lpm hs but not with activity  Rec We will call to get your last pft from [facility] if available  Plan A = Automatic = Always=    Symbicort   80 (for Breztri ) Take 2 puffs first thing in am and then another 2 puffs about 12 hours later.   Work on inhaler technique:  Zpak should improve your cough  Plan B = Backup (to supplement plan A, not to replace it) Only use your albuterol  inhaler as a rescue medication Make sure you check your oxygen saturation  AT  your highest level of activity (not after you stop)   to be sure it stays over 90%  Suggested e-cigs as an optional  one way bridge  Off all tobacco products    CT chest 04/18/22  LDSCT Lung-RADS 4B, suspicious. Additional imaging evaluation or consultation with Pulmonology or Thoracic Surgery recommended. Right middle and right upper lobe pulmonary nodules, suspicious for primary bronchogenic carcinoma or carcinomas. Consider PET of the right middle lobe nodule. The right upper lobe nodule is at the low end of PET resolution. 2. No thoracic adenopathy. 3. Age  advanced coronary artery atherosclerosis. Recommend assessment of coronary risk factors. 4. Aortic atherosclerosis (ICD10-I70.0) and emphysema (ICD10-J43.9).  PET 06/02/22  1. Hypermetabolic medial right middle lobe nodule, high suspicion for malignancy. 2. The 6 by 4 mm right upper lobe nodule is not discernibly Hypermetabolic  3. Borderline enlarged right lower paratracheal lymph node has a maximum SUV of 2.4, equal to the blood pool. This tends to favor benign process.   06/06/2022  f/u ov/[place] office/Melani Brisbane re: GOLD 0 copd/ RML nodule  maint on nothing since had to stop symbicort  80  due to mouth irritation   Chief Complaint  Patient presents with   Follow-up    Wants to talk about pet scan done on 6/29  Dyspnea:  [employer] shopping / uses [facility] parking due to  back problems  Cough: mucus is still green in am assoc with terrible sinuses  Sleeping: 2lpm flat bed coughing disturbs  SABA use: not using  02: 2lpm hs  Covid status: vax 5 / never infected  Rec Doxycycline  100 mg twice daily x 10 daily  Plan A = Automatic = Always=    Bevespi  2 puffs 1st thing in am and 12 hours later Work on inhaler technique:    Plan B = Backup (to supplement plan A, not to replace it)  Only use your albuterol  inhaler as a rescue medication  For cough > mucinex  dm 1200 mg every hours as needed  Prednisone  10 mg take  4 each am x 2 days,   2 each am x 2 days,  1 each am x 2 days and stop   S/p robotic assisted right middle lobectomy and wedge resection of the right upper lobe nodule on 07/25/2022.  The right upper lobe nodule showed adenomatous hyperplasia.  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     03/29/2023  f/u ov/[place] office/Wessie Shanks re: GOLD 2  on no maint rx  and still smoking  No chief complaint on file. Dyspnea:  still walking puppy 50 ft and starts coughing then sob  Cough: discolored worse  in am  Sleeping: bed is flat 2 pillows  SABA use: every few hours since not on maint rx  02: 2lpm hs  and prn  Continues with cp now generalized ant and worse with coughing  Rec For cough / congestion > mucinex  dm 1200 mg every 12 hours as needed and use your flutter valve as much as possible  For nasty mucus >  doxycycline  100 mg twice daily x 7 days (refillable)  Work on inhaler technique:  Plan A = Automatic = Always=   Breztri  Take 2 puffs first thing in am and then another 2 puffs about 12 hours later.  Work on inhaler technique:  Plan B = Backup (to supplement plan A, not to replace it) Only use your albuterol  inhaler as a rescue medication Plan C = Crisis (instead of Plan B but only if Plan B stops working) - only use your albuterol  nebulizer if you first try Plan B  Plan D = Deltasone  Prednisone  10 mg take  4 each am x 2 days,   2 each am x 2 days,  1 each am x 2 days and stop (refillable) Make sure you check your oxygen saturation  AT  your highest level of activity (not after you stop)   to be sure it stays over 90%  Please schedule a follow up visit in 3 months but call sooner if needed  with all RESPIRATORY  medications /inhalers/ solutions in hand    06/28/2023  f/u ov/[place] office/Wayne Wicklund re: GOLD 2 copd/ MPNs/02 dep hs and prn  maint on bevespi  maint on breztri   did not bring meds/ has stopped smoking   Chief Complaint  Patient presents with   COPD    Gold 2  Dyspnea:  still doing puppy walk s 02  Cough: worse x  2 weeks, already took pred/doxy >better but sill waking her up/ using flutter but not mucienx dm as rec > dark green along with nasal d/c Sleeping: flat bed 2 pillows   SABA use: no increase  02: 2lpm  and rarely daytime  Rec For cough / congestion > mucinex  dm 1200 mg every 12 hours as needed and use your flutter valve as much as possible and if not improving > prednisone  x 6 days (refillable)    01/31/2024  f/u ov/ office/Elnora Quizon re: GOLD 2 copd/  MPNs/02 dep hs maint on breztri / down to one cigarette daily / last pred 2 weeks prior to OV   / protonix /pepcid   Chief Complaint  Patient presents with   Follow-up    Copd   Dyspnea:  still doing puppy walk/ run  Cough: mucinex   dm 1200  mg but not using flutter  Sleeping: flat bed one pillow    resp cc  SABA use: neb every 4 hours / not as much hfa  02: 2lpm hs and prn  Rec For cough / congestion > mucinex  dm 1200 mg every 12 hours as needed and use your flutter valve as much as possible. Levaquin  500 mg daily x 10 days ( stop if you aches in joints, especially ankles)  Plan A = Automatic = Always=    breztri  Take 2 puffs first thing in am and then another 2 puffs about 12 hours later.  work on inhaler technique:   Plan B = Backup (to supplement plan A, not to replace it) Only use your albuterol  inhaler as a rescue medication   Plan C = Crisis (instead of Plan B but only if Plan B stops working) - only use your albuterol  nebulizer if you first try Plan B   Plan D = Deltasone  Prednisone  10 mg take  4 each am x 2 days,   2 each am x 2 days,  1 each am x 2 days and stop (refillable) Make sure you check your oxygen saturation  AT  your highest level of activity (not after you stop)   to be sure it stays over 90%  The key is to stop smoking completely before smoking completely stops yo Please schedule a follow up visit in 4  weeks but call sooner if needed  with all RESPIRATORY  medications /inhalers/ solutions in hand  Admit date: 02/20/2024 Discharge date: 02/21/2024 Equipment/Devices: Has home 2 L nasal cannula oxygen  Brief/Interim Summary: She had been treated for COPD exacerbation by her pulmonologist a few weeks ago and she never completely got better.  Her respiratory symptoms have worsened over the past several days and she called her pulmonologist office today porting that she is having terrible wheezing, coughing and gurgling sounds and labored breathing and they directed her to  call 911 and come to the emergency department.  Patient was admitted with acute on chronic hypoxemic respiratory failure in the setting of COPD exacerbation.  She has had significant improvement overnight while on IV steroids as well as continue breathing treatments and is now in stable condition for discharge with no other acute events or concerns noted.  She was noted to have some mild hypercalcemia on lab work which was asymptomatic and is likely related to HCTZ use, however vitamin D  levels are also pending.  This will need to be followed up with PCP and she has her HCTZ changed to Lasix  at this time.    Discharge Diagnoses:  Principal Problem:   COPD with acute exacerbation (HCC)   Acute on chronic respiratory failure with hypoxia (HCC)   GERD (gastroesophageal reflux disease)   Hypercalcemia   Prediabetes   Cigarette smoker   COPD GOLD 2    Chronic respiratory failure with hypoxia and hypercapnia (HCC)   Multiple pulmonary nodules determined by computed tomography of lung   Non-small cell carcinoma of lung, right (HCC)   S/P Right Robotic Assisted Right Upper Lobe Wedge Resection and Right Middle Lobectomy   Squamous cell lung cancer, right (HCC)   HTN, goal below 130/80   HLD (hyperlipidemia)   Bipolar I disorder with depression (HCC)   Bradycardia   CAD (coronary artery disease)   Principal discharge diagnosis: Acute on chronic hypoxemic respiratory failure secondary to acute COPD exacerbation.       02/28/2024  f/u ov/[place] office/Essence Merle re: GOLD  2 02 dep 24/7  maint on breztri   no longer smoking / last pred was 3//25/25 Chief Complaint  Patient presents with   Follow-up    4 week follow up    Dyspnea:  back to puppy walk x 15 min 2lpm /not monitoring on titrating  Cough: some flare in am prematurely >> clear mucus/ not using flutter as rec  Sleeping: flat bed 2 pillows resp cc  SABA use: still using neb twice daily  02: 2 lpm  Rec For cough / congestion > mucinex  dm  1200 mg every 12 hours as needed and use your flutter valve as much as possible. Plan A = Automatic = Always=    breztri  Take 2 puffs first thing in am and then another 2 puffs about 12 hours later.   Work on inhaler technique:   Plan B = Backup (to supplement plan A, not to replace it) Only use your albuterol  inhaler as a rescue medication  Plan C = Crisis (instead of Plan B but only if Plan B stops working) - only use your albuterol  nebulizer if you first try Plan B  Plan D = Deltasone  Prednisone  10 mg take  4 each am x 2 days,   2 each am x 2 days,  1 each am x 2 days and stop (refillable) Make sure you check your oxygen saturation  AT  your highest level of activity (not after you stop)   to be sure it stays over 90%   Please schedule a follow up visit in 3 months but call sooner if needed - bring inhalers [     07/05/2024  f/u ov/[place] office/Agnes Brightbill re: COPD GOLD 2 02 dep 24/7 maint on *** did *** bring inhalers  No chief complaint on file.   Dyspnea:  *** Cough: *** Sleeping: ***   resp cc  SABA use: *** 02: ***  Lung cancer screening: ***   No obvious day to day or daytime variability or assoc excess/ purulent sputum or mucus plugs or hemoptysis or cp or chest tightness, subjective wheeze or overt sinus or hb symptoms.    Also denies any obvious fluctuation of symptoms with weather or environmental changes or other aggravating or alleviating factors except as outlined above   No unusual exposure hx or h/o childhood pna/ asthma or knowledge of premature birth.  Current Allergies, Complete Past Medical History, Past Surgical History, Family History, and Social History were reviewed in [employer] record.  ROS  The following are not active complaints unless bolded Hoarseness, sore throat, dysphagia, dental problems, itching, sneezing,  nasal congestion or discharge of excess mucus or purulent secretions, ear ache,   fever, chills, sweats, unintended  wt loss or wt gain, classically pleuritic or exertional cp,  orthopnea pnd or arm/hand swelling  or leg swelling, presyncope, palpitations, abdominal pain, anorexia, nausea, vomiting, diarrhea  or change in bowel habits or change in bladder habits, change in stools or change in urine, dysuria, hematuria,  rash, arthralgias, visual complaints, headache, numbness, weakness or ataxia or problems with walking or coordination,  change in mood or  memory.        No outpatient medications have been marked as taking for the 07/05/24 encounter (Appointment) with Azaya Goedde B, MD.            Past Medical History:  Diagnosis Date   Anxiety    Arthritis    Bipolar disorder (HCC)    COPD (chronic obstructive pulmonary disease) (HCC)  Depression    Diabetes (HCC)    Type II   Diabetes mellitus, type II (HCC)    Dyspnea    GERD (gastroesophageal reflux disease)    Headache    migraines- 3 times a week   HLD (hyperlipidemia)    HTN (hypertension)    Pneumonia    PTSD (post-traumatic stress disorder)        Objective:     Wts  07/05/2024           ***  02/28/2024        151   01/31/2024       156  06/28/2023       146  02/02/2023       163  12/20/2022       159  08/16/2022       138   06/06/22 143 lb 3.2 oz (65 kg)  05/19/22 149 lb 14.6 oz (68 kg)  04/22/22 150 lb (68 kg)    Vital signs reviewed  07/05/2024  - Note at rest 02 sats  ***% on ***   General appearance:    ***    Mild barrel  contour chest wall with bilateral  insp/ exp rhonchi          Assessment

## 2024-08-01 ENCOUNTER — Ambulatory Visit: Admitting: Podiatry

## 2024-08-01 ENCOUNTER — Ambulatory Visit (INDEPENDENT_AMBULATORY_CARE_PROVIDER_SITE_OTHER)

## 2024-08-01 ENCOUNTER — Encounter: Payer: Self-pay | Admitting: Podiatry

## 2024-08-01 DIAGNOSIS — M722 Plantar fascial fibromatosis: Secondary | ICD-10-CM | POA: Diagnosis not present

## 2024-08-01 DIAGNOSIS — M778 Other enthesopathies, not elsewhere classified: Secondary | ICD-10-CM | POA: Diagnosis not present

## 2024-08-01 MED ORDER — TRIAMCINOLONE ACETONIDE 10 MG/ML IJ SUSP
10.0000 mg | Freq: Once | INTRAMUSCULAR | Status: AC
  Administered 2024-08-01: 10 mg via INTRA_ARTICULAR

## 2024-08-02 NOTE — Progress Notes (Signed)
 Subjective:   Patient ID: Vicki Blankenship, female   DOB: 68 y.o.   MRN: [id number]   HPI Patient presents with a lot of pain in the bottom of the right foot and states that it has been going on for at least a month and is hard to walk on.  Does not remember injury   ROS      Objective:  Physical Exam  Vascular status intact with exquisite discomfort in the distal medial fascial band very tender when pressed slight pain in the capsule but mostly within the fascia itself.  Patient is noted to have no other pathology good digital perfusion     Assessment:  Inflammatory fasciitis of the plantar fascia right with possibility for low-grade capsulitis with foot structure that shows flatfoot which is contributing to condition     Plan:  H&P x-ray reviewed discussed and I went ahead today did sterile prep and injected the medial fascial band 3 mg Kenalog  5 mg Xylocaine  and applied fascial brace to lift up the arch instructing on usage and fitted properly into the arch.  Applied sterile dressing patient to be seen back as symptoms indicate  X-rays indicate there is moderate depression of the arch no other indication of pathology

## 2024-08-09 ENCOUNTER — Telehealth: Payer: Self-pay | Admitting: Podiatry

## 2024-08-09 NOTE — Telephone Encounter (Signed)
 Patient called stating that the pain in her right foot is unbearable. She is wanting to know if there is something we could possibly send in for her. She is scheduled to follow up in office on 9/15. Patient has been added to wait list for earlier appointment. Patients preferred pharmacy is [facility] in [place]

## 2024-08-12 ENCOUNTER — Ambulatory Visit: Admitting: Podiatry

## 2024-08-12 VITALS — Ht 64.0 in | Wt 164.0 lb

## 2024-08-12 DIAGNOSIS — M722 Plantar fascial fibromatosis: Secondary | ICD-10-CM

## 2024-08-12 DIAGNOSIS — M778 Other enthesopathies, not elsewhere classified: Secondary | ICD-10-CM

## 2024-08-12 MED ORDER — MELOXICAM 15 MG PO TABS: 15.0000 mg | ORAL_TABLET | Freq: Every day | ORAL | 2 refills | Status: DC

## 2024-08-12 NOTE — Telephone Encounter (Signed)
 I'm sure there is a time she can get in today. I'm not that busy. 11:30

## 2024-08-13 NOTE — Progress Notes (Signed)
 Subjective:   Patient ID: Vicki Blankenship, female   DOB: 68 y.o.   MRN: [id number]   HPI Patient presents stating she is having unbearable right foot pain and the injection helped for very short period of time   ROS      Objective:  Physical Exam  Vascular status intact with patient's discomfort in the plantar right pleasant but the pain is much more diffuse now is across the midfoot forefoot lateral foot and into the Achilles tendon     Assessment:  Appears to be inflammatory and may be due to systemic inflammation local but very difficult for her to weight-bear     Plan:  H&P reviewed and at this point I am placing her in an air fracture walker to completely immobilize her foot and lower leg which was properly fitted and seems to reduce all the stress and pressure she is experiencing.  I also placed on oral diclofenac instructing on usage and the usage of any anti-inflammatory.  Reappoint as symptoms indicate

## 2024-08-15 ENCOUNTER — Ambulatory Visit: Admitting: Internal Medicine

## 2024-08-15 ENCOUNTER — Encounter: Payer: Self-pay | Admitting: Internal Medicine

## 2024-08-15 VITALS — BP 154/85 | HR 82 | Ht 64.0 in | Wt 168.0 lb

## 2024-08-15 DIAGNOSIS — J9611 Chronic respiratory failure with hypoxia: Secondary | ICD-10-CM | POA: Diagnosis not present

## 2024-08-15 DIAGNOSIS — Z23 Encounter for immunization: Secondary | ICD-10-CM | POA: Diagnosis not present

## 2024-08-15 DIAGNOSIS — J449 Chronic obstructive pulmonary disease, unspecified: Secondary | ICD-10-CM

## 2024-08-15 DIAGNOSIS — J9612 Chronic respiratory failure with hypercapnia: Secondary | ICD-10-CM

## 2024-08-15 DIAGNOSIS — Z87891 Personal history of nicotine dependence: Secondary | ICD-10-CM

## 2024-08-15 DIAGNOSIS — I10 Essential (primary) hypertension: Secondary | ICD-10-CM

## 2024-08-15 NOTE — Patient Instructions (Signed)
 No change in ABCD plan   Work on inhaler technique:  relax and gently blow all the way out then take a nice smooth full deep breath back in, triggering the inhaler at same time you start breathing in.  Hold breath in for at least  5 seconds if you can. Blow out breztri   thru nose. Rinse and gargle with water when done.  If mouth or throat bother you at all,  try brushing teeth/gums/tongue with arm and hammer toothpaste/ make a slurry and gargle and spit out.    Congratulations on not smoking !   Call me or Mychart message the name of your blood pressure medications   Make sure you check your oxygen saturation  AT  your highest level of activity (not after you stop)   to be sure it stays over 90% and adjust  02 flow upward to maintain this level if needed but remember to turn it back to previous settings when you stop (to conserve your supply).    Please schedule a follow up visit in  6 months but call sooner if needed

## 2024-08-15 NOTE — Progress Notes (Unsigned)
 Vicki Blankenship, female    DOB: [birth year]    MRN: [id number]   Brief patient profile:  79 yowf  quit smoking 02/2024   from [place]  grew up in house with smokers/ dx freq bronchitis/ear infections referred to pulmonary clinic in [place]  04/18/2022 by Dr Katrinka.  Daily symptoms since 2011 placed on advair > thrush and since around 2012 just on albuterol  and 02 hs since 2010    History of Present Illness  04/18/2022  Pulmonary/ 1st office eval/ Shantel Wesely / [place] [facility]  Chief Complaint  Patient presents with   Consult    Hx of COPD diagnosed around 2010.   Dyspnea:  [employer] [address] [employer]/ chest tightness walking to mailbox x years 200 ft uphill  to mailbox  Cough: worse since pna dec 2022 esp first thing worse x sev hours x brownish slt bloody mucus and epistaxis since jan 2023  Sleep: on 02 2lpm / flat bed 2 pillows   SABA use: once daily at most but very poor hfa 0 2  2lpm hs but not with activity  Rec We will call to get your last pft from [facility] if available  Plan A = Automatic = Always=    Symbicort   80 (for Breztri ) Take 2 puffs first thing in am and then another 2 puffs about 12 hours later.   Work on inhaler technique:  Zpak should improve your cough  Plan B = Backup (to supplement plan A, not to replace it) Only use your albuterol  inhaler as a rescue medication Make sure you check your oxygen saturation  AT  your highest level of activity (not after you stop)   to be sure it stays over 90%  Suggested e-cigs as an optional  one way bridge  Off all tobacco products    CT chest 04/18/22  LDSCT Lung-RADS 4B, suspicious. Additional imaging evaluation or consultation with Pulmonology or Thoracic Surgery recommended. Right middle and right upper lobe pulmonary nodules, suspicious for primary bronchogenic carcinoma or carcinomas. Consider PET of the right middle lobe nodule. The right upper lobe nodule is at the low end of PET resolution. 2. No thoracic adenopathy. 3. Age  advanced coronary artery atherosclerosis. Recommend assessment of coronary risk factors. 4. Aortic atherosclerosis (ICD10-I70.0) and emphysema (ICD10-J43.9).  PET 06/02/22  1. Hypermetabolic medial right middle lobe nodule, high suspicion for malignancy. 2. The 6 by 4 mm right upper lobe nodule is not discernibly Hypermetabolic  3. Borderline enlarged right lower paratracheal lymph node has a maximum SUV of 2.4, equal to the blood pool. This tends to favor benign process.   06/06/2022  f/u ov/ office/Athanasia Stanwood re: GOLD 0 copd/ RML nodule  maint on nothing since had to stop symbicort  80  due to mouth irritation   Chief Complaint  Patient presents with   Follow-up    Wants to talk about pet scan done on 6/29  Dyspnea:  [employer] shopping / uses [facility] parking due to  back problems  Cough: mucus is still green in am assoc with terrible sinuses  Sleeping: 2lpm flat bed coughing disturbs  SABA use: not using  02: 2lpm hs  Covid status: vax 5 / never infected  Rec Doxycycline  100 mg twice daily x 10 daily  Plan A = Automatic = Always=    Bevespi  2 puffs 1st thing in am and 12 hours later Work on inhaler technique:    Plan B = Backup (to supplement plan A, not to replace it)  Only use your albuterol  inhaler as a rescue medication  For cough > mucinex  dm 1200 mg every hours as needed  Prednisone  10 mg take  4 each am x 2 days,   2 each am x 2 days,  1 each am x 2 days and stop   S/p robotic assisted right middle lobectomy and wedge resection of the right upper lobe nodule on 07/25/2022.  The right upper lobe nodule showed adenomatous hyperplasia.  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     03/29/2023  f/u ov/[place] office/Temisha Murley re: GOLD 2  on no maint rx  and still smoking  No chief complaint on file. Dyspnea:  still walking puppy 50 ft and starts coughing then sob  Cough: discolored worse  in am  Sleeping: bed is flat 2 pillows  SABA use: every few hours since not on maint rx  02: 2lpm hs  and prn  Continues with cp now generalized ant and worse with coughing  Rec For cough / congestion > mucinex  dm 1200 mg every 12 hours as needed and use your flutter valve as much as possible  For nasty mucus >  doxycycline  100 mg twice daily x 7 days (refillable)  Work on inhaler technique:  Plan A = Automatic = Always=   Breztri  Take 2 puffs first thing in am and then another 2 puffs about 12 hours later.  Work on inhaler technique:  Plan B = Backup (to supplement plan A, not to replace it) Only use your albuterol  inhaler as a rescue medication Plan C = Crisis (instead of Plan B but only if Plan B stops working) - only use your albuterol  nebulizer if you first try Plan B  Plan D = Deltasone  Prednisone  10 mg take  4 each am x 2 days,   2 each am x 2 days,  1 each am x 2 days and stop (refillable) Make sure you check your oxygen saturation  AT  your highest level of activity (not after you stop)   to be sure it stays over 90%  Please schedule a follow up visit in 3 months but call sooner if needed  with all RESPIRATORY  medications /inhalers/ solutions in hand    06/28/2023  f/u ov/[place] office/Lulabelle Desta re: GOLD 2 copd/ MPNs/02 dep hs and prn  maint on bevespi  maint on breztri   did not bring meds/ has stopped smoking   Chief Complaint  Patient presents with   COPD    Gold 2  Dyspnea:  still doing puppy walk s 02  Cough: worse x  2 weeks, already took pred/doxy >better but sill waking her up/ using flutter but not mucienx dm as rec > dark green along with nasal d/c Sleeping: flat bed 2 pillows   SABA use: no increase  02: 2lpm  and rarely daytime  Rec For cough / congestion > mucinex  dm 1200 mg every 12 hours as needed and use your flutter valve as much as possible and if not improving > prednisone  x 6 days (refillable)    01/31/2024  f/u ov/ office/Shiza Thelen re: GOLD 2 copd/  MPNs/02 dep hs maint on breztri / down to one cigarette daily / last pred 2 weeks prior to OV   / protonix /pepcid   Chief Complaint  Patient presents with   Follow-up    Copd   Dyspnea:  still doing puppy walk/ run  Cough: mucinex   dm 1200  mg but not using flutter  Sleeping: flat bed one pillow    resp cc  SABA use: neb every 4 hours / not as much hfa  02: 2lpm hs and prn  Rec For cough / congestion > mucinex  dm 1200 mg every 12 hours as needed and use your flutter valve as much as possible. Levaquin  500 mg daily x 10 days ( stop if you aches in joints, especially ankles)  Plan A = Automatic = Always=    breztri  Take 2 puffs first thing in am and then another 2 puffs about 12 hours later.  work on inhaler technique:   Plan B = Backup (to supplement plan A, not to replace it) Only use your albuterol  inhaler as a rescue medication   Plan C = Crisis (instead of Plan B but only if Plan B stops working) - only use your albuterol  nebulizer if you first try Plan B   Plan D = Deltasone  Prednisone  10 mg take  4 each am x 2 days,   2 each am x 2 days,  1 each am x 2 days and stop (refillable) Make sure you check your oxygen saturation  AT  your highest level of activity (not after you stop)   to be sure it stays over 90%  The key is to stop smoking completely before smoking completely stops yo Please schedule a follow up visit in 4  weeks but call sooner if needed  with all RESPIRATORY  medications /inhalers/ solutions in hand  Admit date: 02/20/2024 Discharge date: 02/21/2024 Equipment/Devices: Has home 2 L nasal cannula oxygen  Brief/Interim Summary: She had been treated for COPD exacerbation by her pulmonologist a few weeks ago and she never completely got better.  Her respiratory symptoms have worsened over the past several days and she called her pulmonologist office today porting that she is having terrible wheezing, coughing and gurgling sounds and labored breathing and they directed her to  call 911 and come to the emergency department.  Patient was admitted with acute on chronic hypoxemic respiratory failure in the setting of COPD exacerbation.  She has had significant improvement overnight while on IV steroids as well as continue breathing treatments and is now in stable condition for discharge with no other acute events or concerns noted.  She was noted to have some mild hypercalcemia on lab work which was asymptomatic and is likely related to HCTZ use, however vitamin D  levels are also pending.  This will need to be followed up with PCP and she has her HCTZ changed to Lasix  at this time.    Discharge Diagnoses:  Principal Problem:   COPD with acute exacerbation (HCC)   Acute on chronic respiratory failure with hypoxia (HCC)   GERD (gastroesophageal reflux disease)   Hypercalcemia   Prediabetes   Cigarette smoker   COPD GOLD 2    Chronic respiratory failure with hypoxia and hypercapnia (HCC)   Multiple pulmonary nodules determined by computed tomography of lung   Non-small cell carcinoma of lung, right (HCC)   S/P Right Robotic Assisted Right Upper Lobe Wedge Resection and Right Middle Lobectomy   Squamous cell lung cancer, right (HCC)   HTN, goal below 130/80   HLD (hyperlipidemia)   Bipolar I disorder with depression (HCC)   Bradycardia   CAD (coronary artery disease)   Principal discharge diagnosis: Acute on chronic hypoxemic respiratory failure secondary to acute COPD exacerbation.       02/28/2024  f/u ov/[place] office/Nelline Lio re: GOLD  2 02 dep 24/7  maint on breztri   no longer smoking / last pred was 3//25/25 Chief Complaint  Patient presents with   Follow-up    4 week follow up    Dyspnea:  back to puppy walk x 15 min 2lpm /not monitoring on titrating  Cough: some flare in am prematurely >> clear mucus/ not using flutter as rec  Sleeping: flat bed 2 pillows resp cc  SABA use: still using neb twice daily  02: 2 lpm   Rec For cough / congestion > mucinex  dm  1200 mg every 12 hours as needed and use your flutter valve as much as possible. Plan A = Automatic = Always=    breztri  Take 2 puffs first thing in am and then another 2 puffs about 12 hours later.   Work on inhaler technique:  Plan B = Backup (to supplement plan A, not to replace it) Only use your albuterol  inhaler as a rescue medication  Plan C = Crisis (instead of Plan B but only if Plan B stops working) - only use your albuterol  nebulizer if you first try Plan B  Plan D = Deltasone  Prednisone  10 mg take  4 each am x 2 days,   2 each am x 2 days,  1 each am x 2 days and stop (refillable) Make sure you check your oxygen saturation  AT  your highest level of activity (not after you stop)   to be sure it stays over 90%    08/15/2024  f/u ov/[place] office/Jaquelinne Glendening re: GOLD 2 copd 02 dep  maint on bretri and not smoking   Chief Complaint  Patient presents with   COPD  Dyspnea:  housework is the hardest with new [place]  CP with ex 3 month only with ex, relieved at rest esp on 02  Cough: better and no longer needing flutter  Sleeping: flat bed / 2 pillows s noct resp cc  SABA use: mininmal  02: 2lpm hs and prn daytime but not checking even when sob / cp   Lung cancer screening: oncology f/u ct for 09/30/24    No obvious day to day or daytime variability or assoc excess/ purulent sputum or mucus plugs or hemoptysis or , subjective wheeze or overt sinus or hb symptoms.    Also denies any obvious fluctuation of symptoms with weather or environmental changes or other aggravating or alleviating factors except as outlined above   No unusual exposure hx or h/o childhood pna/ asthma or knowledge of premature birth.  Current Allergies, Complete Past Medical History, Past Surgical History, Family History, and Social History were reviewed in [employer] record.  ROS  The following are not active complaints unless bolded Hoarseness, sore throat, dysphagia, dental problems,  itching, sneezing,  nasal congestion or discharge of excess mucus or purulent secretions, ear ache,   fever, chills, sweats, unintended wt loss or wt gain, classically pleuritic   cp,  orthopnea pnd or arm/hand swelling  or leg swelling, presyncope, palpitations, abdominal pain, anorexia, nausea, vomiting, diarrhea  or change in bowel habits or change in bladder habits, change in stools or change in urine, dysuria, hematuria,  rash, arthralgias, visual complaints, headache, numbness, weakness or ataxia or problems with walking or coordination,  change in mood or  memory.        Current Meds  Medication Sig   albuterol  (PROVENTIL ) (2.5 MG/3ML) 0.083% nebulizer solution Take 3 mLs (2.5 mg total) by nebulization every 4 (four) hours as  needed for wheezing or shortness of breath.   albuterol  (VENTOLIN  HFA) 108 (90 Base) MCG/ACT inhaler Inhale 1-2 puffs into the lungs every 6 (six) hours as needed for wheezing or shortness of breath.   amLODipine (NORVASC) 5 MG tablet Take 5 mg by mouth daily.   ARIPiprazole  (ABILIFY ) 10 MG tablet Take 10 mg by mouth daily.   aspirin  81 MG chewable tablet Chew 81 mg by mouth daily.   atorvastatin  (LIPITOR) 40 MG tablet Take 40 mg by mouth daily.   Budeson-Glycopyrrol-Formoterol  (BREZTRI  AEROSPHERE) 160-9-4.8 MCG/ACT AERO Inhale 2 puffs into the lungs 2 (two) times daily.   clonazePAM  (KLONOPIN ) 0.5 MG tablet Take 0.5 mg by mouth 3 (three) times daily.   cyclobenzaprine  (FLEXERIL ) 10 MG tablet Take 1 tablet (10 mg total) by mouth 3 (three) times daily as needed for muscle spasms.   DULoxetine  (CYMBALTA ) 60 MG capsule Take 60 mg by mouth in the morning.   famotidine  (PEPCID ) 20 MG tablet TAKE ONE TABLET BY MOUTH EVERY EVENING AFTER SUPPER   guaiFENesin  (MUCINEX ) 600 MG 12 hr tablet Take 1,200 mg by mouth 2 (two) times daily.   hydrochlorothiazide  (HYDRODIURIL ) 25 MG tablet Take 25 mg by mouth daily.   lithium  carbonate 300 MG capsule Take 300 mg by mouth in the morning  and at bedtime.   losartan  (COZAAR ) 25 MG tablet Take 25 mg by mouth daily.   meloxicam  (MOBIC ) 15 MG tablet Take 1 tablet (15 mg total) by mouth daily.   metFORMIN  (GLUCOPHAGE ) 500 MG tablet Take 500 mg by mouth 2 (two) times daily.   mirtazapine  (REMERON ) 30 MG tablet Take 30 mg by mouth at bedtime.   Multiple Vitamin (MULTIVITAMIN ADULT PO) Take 1 tablet by mouth daily.   naloxone (NARCAN) 4 MG/0.1ML LIQD nasal spray kit Place 1 spray into the nose as needed (opioid overdose).   OXYGEN Inhale 2 L into the lungs continuous.   pantoprazole  (PROTONIX ) 40 MG tablet Take 1 tablet (40 mg total) by mouth daily before breakfast.   pregabalin  (LYRICA ) 75 MG capsule Take 1 capsule (75 mg total) by mouth 2 (two) times daily.   vitamin C  (ASCORBIC ACID ) 500 MG tablet Take 500 mg by mouth daily.   Vitamin D , Cholecalciferol, 25 MCG (1000 UT) TABS Take 1,000 Units by mouth daily.           Past Medical History:  Diagnosis Date   Anxiety    Arthritis    Bipolar disorder (HCC)    COPD (chronic obstructive pulmonary disease) (HCC)    Depression    Diabetes (HCC)    Type II   Diabetes mellitus, type II (HCC)    Dyspnea    GERD (gastroesophageal reflux disease)    Headache    migraines- 3 times a week   HLD (hyperlipidemia)    HTN (hypertension)    Pneumonia    PTSD (post-traumatic stress disorder)        Objective:     Wts  08/15/2024        168   02/28/2024        151   01/31/2024       156  06/28/2023       146  02/02/2023       163  12/20/2022       159  08/16/2022       138   06/06/22 143 lb 3.2 oz (65 kg)  05/19/22 149 lb 14.6 oz (68 kg)  04/22/22 150 lb (68  kg)    Vital signs reviewed  08/15/2024  - Note at rest 02 sats  88% on RA   General appearance:    amb pleasant wf nad   HEENT : Oropharynx  nl   Nasal turbinates nl    NECK :  without  apparent JVD/ palpable Nodes/TM    LUNGS: no acc muscle use,  Mild barrel  contour chest wall with bilateral  Distant bs s audible  wheeze and  without cough on insp or exp maneuvers  and mild  Hyperresonant  to  percussion bilaterally     CV:  RRR  no s3 or murmur or increase in P2, and no edema   ABD:  soft and nontender   MS:  Nl gait/ ext warm without deformities Or obvious joint restrictions  calf tenderness, cyanosis or clubbing     SKIN: warm and dry without lesions    NEURO:  alert, approp, nl sensorium with  no motor or cerebellar deficits apparent.      Assessment      Assessment & Plan COPD GOLD 2  Quit smoking 02/2024  - PFTs 06/04/15 no significant obstruction/ ERV 20% at wt 189 / nl dlco  - Holter 01/22/16 x 24 h  Nl but no symptoms reported during monitor - 04/18/2022  After extensive coaching inhaler device,  effectiveness =    50% > try symbicort  80 2bid due to thrush on advair previously plus approp saba > could not tolerate oral side effects of ics - 06/06/2022   > try Bevespi  2 bid and practice with empty Breztri  container - PFT's  07/05/22  FEV1 1.86 (76 % ) ratio 0.65  p 7 % improvement from saba p BREO prior to study with DLCO  14.95 (75%)   and FV curve mild concavity     12/20/2022    try bevespi   - 03/29/2023    flaring with aecopd >>> change to breztri  plus added doxy and pred x 6  as part of action plan  - 06/28/2023  After extensive coaching inhaler device,  effectiveness =    80% hfa > continue breztri  /flutter with mucinex  dm for flares  - 02/28/2024  After extensive coaching inhaler device,  effectiveness =    60%  (short Ti) continue breztri /approp saba and  flutter and pred as plan D    Group E in terms of symptom/risk and laba/lama/ICS  therefore appropriate rx at this point >>>  continue breztr and ABCD paln as above    Chronic respiratory failure with hypoxia and hypercapnia (HCC) HC03   On 03/01/22 =  31 but as high as 35 in 2001  -  04/18/2022   Walked on RA  x  3  lap(s) =  approx 450  ft  @ moderate pace, stopped due to end of study with lowest 02 sats 91% cc sob @ 3rd lap  -  06/06/2022   Walked on RA   x  3  lap(s) =  approx 450  ft  @ fast pace, stopped due to end of walk with lowest 02 sats 90% with sob on 3rd lap   -08/16/2022   Walked on RA  x  3  lap(s) =  approx 450  ft  @ mod pace, stopped due to end of study s sob with lowest 02 sats 96%  >>> advised to 02 sats with ex to be sure they stay well above 90% especially in setting of reproducible ex cp.  Need for vaccination against Streptococcus pneumoniae >>> Given high dose flu and Prevnar 20 tdoay   HTN, goal below 130/80 Poorly controlled and confused with names of bp meds.  Her cp with exertion could be angina and certainly could be partly due to inadequate 02 sats with ex so rec   1) call me with names of BP meds and let cardiology know if 1st assure ex  02 isats not the issue   2) see resp faiure re 02 sats with ex  3) RTC with all meds in hand using a trust but verify approach to confirm accurate Medication  Reconciliation The principal here is that until we are certain that the  patients are doing what we've asked, it makes no sense to ask them to do more.     AVS  Patient Instructions  No change in ABCD plan   Work on inhaler technique:  relax and gently blow all the way out then take a nice smooth full deep breath back in, triggering the inhaler at same time you start breathing in.  Hold breath in for at least  5 seconds if you can. Blow out breztri   thru nose. Rinse and gargle with water when done.  If mouth or throat bother you at all,  try brushing teeth/gums/tongue with arm and hammer toothpaste/ make a slurry and gargle and spit out.    Congratulations on not smoking !   Call me or Mychart message the name of your blood pressure medications   Make sure you check your oxygen saturation  AT  your highest level of activity (not after you stop)   to be sure it stays over 90% and adjust  02 flow upward to maintain this level if needed but remember to turn it back to previous settings when you  stop (to conserve your supply).    Please schedule a follow up visit in  6 months but call sooner if needed       Ozell America, MD 08/17/2024

## 2024-08-17 NOTE — Assessment & Plan Note (Addendum)
 Quit smoking 02/2024  - PFTs 06/04/15 no significant obstruction/ ERV 20% at wt 189 / nl dlco  - Holter 01/22/16 x 24 h  Nl but no symptoms reported during monitor - 04/18/2022  After extensive coaching inhaler device,  effectiveness =    50% > try symbicort  80 2bid due to thrush on advair previously plus approp saba > could not tolerate oral side effects of ics - 06/06/2022   > try Bevespi  2 bid and practice with empty Breztri  container - PFT's  07/05/22  FEV1 1.86 (76 % ) ratio 0.65  p 7 % improvement from saba p BREO prior to study with DLCO  14.95 (75%)   and FV curve mild concavity     12/20/2022    try bevespi   - 03/29/2023    flaring with aecopd >>> change to breztri  plus added doxy and pred x 6  as part of action plan  - 06/28/2023  After extensive coaching inhaler device,  effectiveness =    80% hfa > continue breztri  /flutter with mucinex  dm for flares  - 02/28/2024  After extensive coaching inhaler device,  effectiveness =    60%  (short Ti) continue breztri /approp saba and  flutter and pred as plan D    Group E in terms of symptom/risk and laba/lama/ICS  therefore appropriate rx at this point >>>  continue breztr and ABCD paln as above

## 2024-08-17 NOTE — Assessment & Plan Note (Addendum)
 Poorly controlled and confused with names of bp meds.  Her cp with exertion could be angina and certainly could be partly due to inadequate 02 sats with ex so rec   1) call me with names of BP meds and let cardiology know if 1st assure ex  02 isats not the issue   2) see resp faiure re 02 sats with ex  3) RTC with all meds in hand using a trust but verify approach to confirm accurate Medication  Reconciliation The principal here is that until we are certain that the  patients are doing what we've asked, it makes no sense to ask them to do more.

## 2024-08-17 NOTE — Assessment & Plan Note (Addendum)
 HC03   On 03/01/22 =  31 but as high as 35 in 2001  -  04/18/2022   Walked on RA  x  3  lap(s) =  approx 450  ft  @ moderate pace, stopped due to end of study with lowest 02 sats 91% cc sob @ 3rd lap  - 06/06/2022   Walked on RA   x  3  lap(s) =  approx 450  ft  @ fast pace, stopped due to end of walk with lowest 02 sats 90% with sob on 3rd lap   -08/16/2022   Walked on RA  x  3  lap(s) =  approx 450  ft  @ mod pace, stopped due to end of study s sob with lowest 02 sats 96%  >>> advised to 02 sats with ex to be sure they stay well above 90% especially in setting of reproducible ex cp.

## 2024-08-19 ENCOUNTER — Ambulatory Visit: Admitting: Podiatry

## 2024-09-18 ENCOUNTER — Other Ambulatory Visit: Payer: Self-pay | Admitting: *Deleted

## 2024-09-18 MED ORDER — PREGABALIN 75 MG PO CAPS: 75.0000 mg | ORAL_CAPSULE | Freq: Two times a day (BID) | ORAL | 5 refills | Status: AC

## 2024-09-27 ENCOUNTER — Other Ambulatory Visit: Payer: Self-pay

## 2024-09-27 DIAGNOSIS — C3491 Malignant neoplasm of unspecified part of right bronchus or lung: Secondary | ICD-10-CM

## 2024-09-30 ENCOUNTER — Ambulatory Visit (HOSPITAL_COMMUNITY)
Admission: RE | Admit: 2024-09-30 | Discharge: 2024-09-30 | Disposition: A | Discharge status: Home / Self Care | Source: Ambulatory Visit | Attending: Hematology | Admitting: Hematology

## 2024-09-30 ENCOUNTER — Inpatient Hospital Stay: Attending: Oncology

## 2024-09-30 DIAGNOSIS — C3491 Malignant neoplasm of unspecified part of right bronchus or lung: Secondary | ICD-10-CM | POA: Insufficient documentation

## 2024-09-30 DIAGNOSIS — Z902 Acquired absence of lung [part of]: Secondary | ICD-10-CM | POA: Insufficient documentation

## 2024-09-30 DIAGNOSIS — Z87891 Personal history of nicotine dependence: Secondary | ICD-10-CM | POA: Diagnosis not present

## 2024-09-30 LAB — CBC WITH DIFFERENTIAL/PLATELET
Abs Immature Granulocytes: 0.03 K/uL (ref 0.00–0.07)
Basophils Absolute: 0.1 K/uL (ref 0.0–0.1)
Basophils Relative: 1 %
Eosinophils Absolute: 0.2 K/uL (ref 0.0–0.5)
Eosinophils Relative: 2 %
HCT: 45.5 % (ref 36.0–46.0)
Hemoglobin: 14.1 g/dL (ref 12.0–15.0)
Immature Granulocytes: 0 %
Lymphocytes Relative: 15 %
Lymphs Abs: 1.3 K/uL (ref 0.7–4.0)
MCH: 30.5 pg (ref 26.0–34.0)
MCHC: 31 g/dL (ref 30.0–36.0)
MCV: 98.5 fL (ref 80.0–100.0)
Monocytes Absolute: 0.9 K/uL (ref 0.1–1.0)
Monocytes Relative: 10 %
Neutro Abs: 6.2 K/uL (ref 1.7–7.7)
Neutrophils Relative %: 72 %
Platelets: 299 K/uL (ref 150–400)
RBC: 4.62 MIL/uL (ref 3.87–5.11)
RDW: 15.1 % (ref 11.5–15.5)
WBC: 8.7 K/uL (ref 4.0–10.5)
nRBC: 0 % (ref 0.0–0.2)

## 2024-09-30 LAB — VITAMIN D 25 HYDROXY (VIT D DEFICIENCY, FRACTURES): Vit D, 25-Hydroxy: 35.49 ng/mL (ref 30–100)

## 2024-09-30 LAB — COMPREHENSIVE METABOLIC PANEL WITH GFR
ALT: 42 U/L (ref 0–44)
AST: 36 U/L (ref 15–41)
Albumin: 4.3 g/dL (ref 3.5–5.0)
Alkaline Phosphatase: 91 U/L (ref 38–126)
Anion gap: 7 (ref 5–15)
BUN: 10 mg/dL (ref 8–23)
CO2: 33 mmol/L — ABNORMAL HIGH (ref 22–32)
Calcium: 10.5 mg/dL — ABNORMAL HIGH (ref 8.9–10.3)
Chloride: 101 mmol/L (ref 98–111)
Creatinine, Ser: 0.7 mg/dL (ref 0.44–1.00)
GFR, Estimated: >60 mL/min (ref >60)
Glucose, Bld: 165 mg/dL — ABNORMAL HIGH (ref 70–99)
Potassium: 4.6 mmol/L (ref 3.5–5.1)
Sodium: 142 mmol/L (ref 135–145)
Total Bilirubin: 0.5 mg/dL (ref 0.0–1.2)
Total Protein: 7.6 g/dL (ref 6.5–8.1)

## 2024-09-30 MED ORDER — IOHEXOL 300 MG/ML  SOLN
75.0000 mL | Freq: Once | INTRAMUSCULAR | Status: AC | PRN
  Administered 2024-09-30: 75 mL via INTRAVENOUS

## 2024-10-10 ENCOUNTER — Inpatient Hospital Stay: Admitting: Oncology

## 2024-10-15 ENCOUNTER — Other Ambulatory Visit: Payer: Self-pay | Admitting: Podiatry

## 2024-11-05 ENCOUNTER — Inpatient Hospital Stay: Attending: Oncology | Admitting: Oncology

## 2024-11-05 VITALS — BP 144/74 | HR 77 | Temp 99.8°F | Resp 18 | Ht 64.0 in | Wt 168.0 lb

## 2024-11-05 DIAGNOSIS — C3491 Malignant neoplasm of unspecified part of right bronchus or lung: Secondary | ICD-10-CM

## 2024-11-05 DIAGNOSIS — F17210 Nicotine dependence, cigarettes, uncomplicated: Secondary | ICD-10-CM | POA: Diagnosis not present

## 2024-11-05 DIAGNOSIS — Z79899 Other long term (current) drug therapy: Secondary | ICD-10-CM | POA: Insufficient documentation

## 2024-11-05 DIAGNOSIS — Z808 Family history of malignant neoplasm of other organs or systems: Secondary | ICD-10-CM | POA: Diagnosis not present

## 2024-11-05 DIAGNOSIS — Z8052 Family history of malignant neoplasm of bladder: Secondary | ICD-10-CM | POA: Diagnosis not present

## 2024-11-05 DIAGNOSIS — Z902 Acquired absence of lung [part of]: Secondary | ICD-10-CM | POA: Diagnosis not present

## 2024-11-05 DIAGNOSIS — G629 Polyneuropathy, unspecified: Secondary | ICD-10-CM | POA: Diagnosis not present

## 2024-11-05 DIAGNOSIS — Z8042 Family history of malignant neoplasm of prostate: Secondary | ICD-10-CM | POA: Diagnosis not present

## 2024-11-05 DIAGNOSIS — Z801 Family history of malignant neoplasm of trachea, bronchus and lung: Secondary | ICD-10-CM | POA: Diagnosis not present

## 2024-11-05 DIAGNOSIS — Z803 Family history of malignant neoplasm of breast: Secondary | ICD-10-CM | POA: Diagnosis not present

## 2024-11-05 DIAGNOSIS — Z85118 Personal history of other malignant neoplasm of bronchus and lung: Secondary | ICD-10-CM | POA: Insufficient documentation

## 2024-11-05 DIAGNOSIS — J439 Emphysema, unspecified: Secondary | ICD-10-CM | POA: Diagnosis not present

## 2024-11-05 DIAGNOSIS — Z9981 Dependence on supplemental oxygen: Secondary | ICD-10-CM | POA: Diagnosis not present

## 2024-11-05 NOTE — Assessment & Plan Note (Addendum)
 Continue Lyrica 75 mg twice daily

## 2024-11-05 NOTE — Progress Notes (Signed)
 [facility] OFFICE PROGRESS NOTE  Doroteo Deward BIRCH, FNP  ASSESSMENT & PLAN:    Assessment & Plan Non-small cell carcinoma of lung, right (HCC) - She does not report any chest pains or recent infections.  No hemoptysis. - CT chest (09/30/2024): Stable/improvement of lymph nodes.  Postsurgical changes of right upper and middle lobes.  No new areas of concern. -Recent lab work shows unremarkable CBC and vitamin D  levels.  Calcium  level is 10.5 which is down from 8 months ago 11.1. - Recommend follow-up in 6 months with repeat CT chest. Neuropathy -Continue Lyrica  75 mg twice daily.   Orders Placed This Encounter  Procedures   CT CHEST W CONTRAST    Standing Status:   Future    Expected Date:   05/08/2025    Expiration Date:   11/05/2025    If indicated for the ordered procedure, I authorize the administration of contrast media per Radiology protocol:   Yes    Does the patient have a contrast media/X-ray dye allergy?:   No    Preferred imaging location?:   [facility]   Vitamin D  25 hydroxy    Standing Status:   Future    Expected Date:   05/06/2025    Expiration Date:   08/04/2025   Comprehensive metabolic panel    Standing Status:   Future    Expected Date:   05/06/2025    Expiration Date:   08/04/2025   CBC with Differential    Standing Status:   Future    Expected Date:   05/06/2025    Expiration Date:   08/04/2025    INTERVAL HISTORY: Patient returns for follow-up for stage I non-small cell lung cancer.  She recently had a CT scan on 09/30/2024 which showed postsurgical changes of the right upper and middle lobes.  Previously noted nodular area in the inferior right upper lobe is no longer seen.  No new or enlarging pulmonary nodules.  Slightly decreased size of 10 mm peripheral lymph node previously 12 mm.  Unchanged dilated ascending thoracic aorta measuring 4.0 x 3.8 cm.  Aortic arthrosclerosis and emphysema.  She is followed by pulmonology and saw Dr. Darlean a  few months ago.  She has occasional COPD exacerbations.  She has shortness of breath with daily activities.  She was recently treated for a URI with antibiotics and steroids along with otc cough meds. Reports low energy levels and lingering cough X 8 months. Has history of cdiff ad see GI in a few days. Chronic diarrhea.   She denies any interval hospitalizations, surgeries or changes to her baseline health.  We reviewed CT chest, CBC, CMP and vitamin D .  SUMMARY OF HEMATOLOGIC HISTORY: Oncology History Overview Note  Assessment: 1.  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 - PET (06/02/2022): Right middle lobe nodule 1.3 x 0.7 cm, SUV 4.8.  6 x 4 mm right upper lobe subpleural nodule with no activity.  1 cm short axis lower paratracheal node, SUV 2.4. - Right Middle lobectomy, lymph node biopsy, right upper lobe wedge resection on 07/25/2022 - Pathology: 1.5 x 1.1 x 1 cm squamous cell carcinoma, margins negative, negative visceral pleural involvement, lymph node stations 4R, 7, 9, 11, 12 negative for metastatic disease.  IHC positive for CK5/6, p63 and negative for TTF-1.  Ki-67 shows increased proliferation.   2.  Social/family history: - She lives at home with her husband.  Quit smoking on 07/24/2022.  She uses oxygen 2 L at bedtime.  Smoked 1 pack/day for 50 years.  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 - Father died of metastatic bladder cancer.  Mother had breast cancer.  2 paternal uncles had brain cancer and prostate cancer.   Squamous cell lung cancer, right (HCC)  08/29/2022 Initial Diagnosis   Squamous cell lung cancer, right (HCC)   08/29/2022 Cancer Staging   Staging form: Lung, AJCC 8th Edition - Clinical stage from 08/29/2022: Stage IA2 (cT1b, cN0, cM0) - Signed by Rogers Hai, MD on 08/29/2022 Histopathologic type:  Squamous cell carcinoma, keratinizing, NOS Stage prefix: Initial diagnosis      CBC    Component Value Date/Time   WBC 8.7 09/30/2024 0854   RBC 4.62 09/30/2024 0854   HGB 14.1 09/30/2024 0854   HCT 45.5 09/30/2024 0854   PLT 299 09/30/2024 0854   MCV 98.5 09/30/2024 0854   MCH 30.5 09/30/2024 0854   MCHC 31.0 09/30/2024 0854   RDW 15.1 09/30/2024 0854   LYMPHSABS 1.3 09/30/2024 0854   MONOABS 0.9 09/30/2024 0854   EOSABS 0.2 09/30/2024 0854   BASOSABS 0.1 09/30/2024 0854       Latest Ref Rng & Units 09/30/2024    8:54 AM 02/21/2024    4:20 AM 02/20/2024   11:14 AM  CMP  Glucose 70 - 99 mg/dL 834  816  856   BUN 8 - 23 mg/dL 10  20  13    Creatinine 0.44 - 1.00 mg/dL 9.29  9.23  9.27   Sodium 135 - 145 mmol/L 142  140  141   Potassium 3.5 - 5.1 mmol/L 4.6  4.6  3.8   Chloride 98 - 111 mmol/L 101  98  96   CO2 22 - 32 mmol/L 33  31  33   Calcium  8.9 - 10.3 mg/dL 89.4  88.8  88.6   Total Protein 6.5 - 8.1 g/dL 7.6     Total Bilirubin 0.0 - 1.2 mg/dL 0.5     Alkaline Phos 38 - 126 U/L 91     AST 15 - 41 U/L 36     ALT 0 - 44 U/L 42        Lab Results  Component Value Date   VITAMINB12 1,279 (H) 07/26/2022    Vitals:   11/05/24 1024  BP: (!) 144/74  Pulse: 77  Resp: 18  Temp: 99.8 F (37.7 C)  SpO2: 95%    Review of System:  Review of Systems  Respiratory:  Positive for shortness of breath.   Cardiovascular:  Positive for chest pain.  Gastrointestinal:  Positive for constipation, diarrhea and nausea.  Neurological:  Positive for tingling, sensory change and headaches.  Psychiatric/Behavioral:  Positive for depression. The patient has insomnia.     Physical Exam: Physical Exam Constitutional:      Appearance: Normal appearance.  HENT:     Head: Normocephalic and atraumatic.  Eyes:     Pupils: Pupils are equal, round, and reactive to light.  Cardiovascular:     Rate and Rhythm: Normal rate and regular rhythm.     Heart sounds: Normal heart sounds.  No murmur heard. Pulmonary:     Effort: Pulmonary effort is normal.     Breath sounds: Normal breath sounds. No wheezing.  Abdominal:     General: Bowel sounds are normal. There is no distension.  Palpations: Abdomen is soft.     Tenderness: There is no abdominal tenderness.  Musculoskeletal:        General: Normal range of motion.     Cervical back: Normal range of motion.  Skin:    General: Skin is warm and dry.     Findings: No rash.  Neurological:     Mental Status: She is alert and oriented to person, place, and time.     Gait: Gait is intact.  Psychiatric:        Mood and Affect: Mood and affect normal.        Cognition and Memory: Memory normal.        Judgment: Judgment normal.      I spent 25 minutes dedicated to the care of this patient (face-to-face and non-face-to-face) on the date of the encounter to include what is described in the assessment and plan.,  Delon Hope, NP 11/05/2024 10:27 AM

## 2024-11-05 NOTE — Assessment & Plan Note (Addendum)
-   She does not report any chest pains or recent infections.  No hemoptysis. - CT chest (09/30/2024): Stable/improvement of lymph nodes.  Postsurgical changes of right upper and middle lobes.  No new areas of concern. -Recent lab work shows unremarkable CBC and vitamin D  levels.  Calcium  level is 10.5 which is down from 8 months ago 11.1. - Recommend follow-up in 6 months with repeat CT chest.

## 2024-11-06 ENCOUNTER — Encounter: Payer: Self-pay | Admitting: Gastroenterology

## 2024-11-06 ENCOUNTER — Ambulatory Visit: Admitting: Gastroenterology

## 2024-11-06 VITALS — BP 150/77 | HR 67 | Temp 98.8°F | Ht 64.0 in | Wt 167.2 lb

## 2024-11-06 DIAGNOSIS — R198 Other specified symptoms and signs involving the digestive system and abdomen: Secondary | ICD-10-CM | POA: Diagnosis not present

## 2024-11-06 DIAGNOSIS — R109 Unspecified abdominal pain: Secondary | ICD-10-CM | POA: Insufficient documentation

## 2024-11-06 DIAGNOSIS — R1084 Generalized abdominal pain: Secondary | ICD-10-CM

## 2024-11-06 DIAGNOSIS — K219 Gastro-esophageal reflux disease without esophagitis: Secondary | ICD-10-CM | POA: Diagnosis not present

## 2024-11-06 MED ORDER — PANTOPRAZOLE SODIUM 40 MG PO TBEC: 40.0000 mg | DELAYED_RELEASE_TABLET | Freq: Every day | ORAL | 3 refills | Status: AC

## 2024-11-06 NOTE — Progress Notes (Signed)
 GI Office Note    Referring Provider: Doroteo Deward BIRCH, FNP Primary Care Physician:  Doroteo Deward BIRCH, FNP  Primary Gastroenterologist: Carlin POUR. Cindie, DO   Chief Complaint   Chief Complaint  Patient presents with   Abdominal Pain    Pt has been having issues with constipation, abd pain and reflux    History of Present Illness   Vicki Blankenship is a 68 y.o. female presenting today for constipation, GERD, abdominal pain. Last seen 03/2022.  She was diagnosed with stage I right middle lobe squamous cell carcinoma of the lung since her last visit. She required resection but no adjuvant therapy.   History of alternating constipation and diarrhea dating back to her 4s.  Previously told that she likely had IBS, because they could not find anything else.  She is concerned because she has been having increased frequency of loose stools.  She has a history of C. difficile remotely.  She took several rounds of antibiotics for her COPD last year.  She notes however she can go days without a bowel movement at all, then subsequent days will have multiple loose stools.  Sometimes she has nocturnal stools, this has been happening more frequently.  Her stools are green which alarms her.  Denies any black or bloody stools.  She has associated abdominal cramping which often is relieved with bowel movement.  She really has not noticed any kind of food triggers.  Her appetite is good.  She has a bit of a sweet tooth.  She notes a lot of heartburn often in the morning and after laying down at night.  Complains of regurgitation after she lays down at night.  She ran out of pantoprazole  well over a year ago.  She has been on famotidine  at bedtime ordered by Dr. Darlean, pulmonologist.  She denies any dysphagia.  No weight loss.  She quit smoking lung cancer gnosis 30 pounds.   Prior Data    Colonoscopy 05/2022: -non-bleeding internal hemorrhoids -diverticulosis -two 4-5 mm polyps in sigmoid colon,  hyperplastic -repeat colonoscopy in 10 years   EGD 05/2022: -z-line regular, 41cm from incisors -nodular mucosa in esophagus s/p bx focal mild inflammation -gastritis s/p bx neg h.pylori -normal examined duodenum  Medications   Current Outpatient Medications  Medication Sig Dispense Refill   albuterol  (PROVENTIL ) (2.5 MG/3ML) 0.083% nebulizer solution Take 3 mLs (2.5 mg total) by nebulization every 4 (four) hours as needed for wheezing or shortness of breath. 75 mL 12   albuterol  (VENTOLIN  HFA) 108 (90 Base) MCG/ACT inhaler Inhale 1-2 puffs into the lungs every 6 (six) hours as needed for wheezing or shortness of breath. 8 g 3   amLODipine (NORVASC) 5 MG tablet Take 5 mg by mouth daily.     ARIPiprazole  (ABILIFY ) 10 MG tablet Take 10 mg by mouth daily.     aspirin  81 MG chewable tablet Chew 81 mg by mouth daily.     atorvastatin  (LIPITOR) 40 MG tablet Take 40 mg by mouth daily.     Budeson-Glycopyrrol-Formoterol  (BREZTRI  AEROSPHERE) 160-9-4.8 MCG/ACT AERO Inhale 2 puffs into the lungs 2 (two) times daily. 10.7 g 11   clonazePAM  (KLONOPIN ) 0.5 MG tablet Take 0.5 mg by mouth 3 (three) times daily.     DULoxetine  (CYMBALTA ) 60 MG capsule Take 60 mg by mouth in the morning.     famotidine  (PEPCID ) 20 MG tablet TAKE ONE TABLET BY MOUTH EVERY EVENING AFTER SUPPER 30 tablet 11   glycopyrrolate  (ROBINUL )  1 MG tablet Take by mouth.     hydrochlorothiazide  (HYDRODIURIL ) 25 MG tablet Take 25 mg by mouth daily.     lithium  carbonate 300 MG capsule Take 300 mg by mouth in the morning and at bedtime.     losartan  (COZAAR ) 50 MG tablet Take 50 mg by mouth daily.     meloxicam  (MOBIC ) 15 MG tablet TAKE ONE TABLET BY MOUTH ONCE DAILY 30 tablet 2   metFORMIN  (GLUCOPHAGE ) 1000 MG tablet Take 1,000 mg by mouth 2 (two) times daily.     mirtazapine  (REMERON ) 30 MG tablet Take 30 mg by mouth at bedtime.     Multiple Vitamin (MULTIVITAMIN ADULT PO) Take 1 tablet by mouth daily.     naloxone (NARCAN) 4 MG/0.1ML  LIQD nasal spray kit Place 1 spray into the nose as needed (opioid overdose).     OXYGEN Inhale 2 L into the lungs continuous.     pilocarpine (PILOCAR) 1 % ophthalmic solution SMARTSIG:1 Drop(s) In Eye(s) Every 12 Hours PRN     pregabalin  (LYRICA ) 75 MG capsule Take 1 capsule (75 mg total) by mouth 2 (two) times daily. 60 capsule 5   vitamin C  (ASCORBIC ACID ) 500 MG tablet Take 500 mg by mouth daily.     Vitamin D , Cholecalciferol, 25 MCG (1000 UT) TABS Take 1,000 Units by mouth daily.     pantoprazole  (PROTONIX ) 40 MG tablet Take 1 tablet (40 mg total) by mouth daily before breakfast. (Patient not taking: Reported on 11/06/2024) 90 tablet 3   No current facility-administered medications for this visit.    Allergies   Allergies as of 11/06/2024 - Review Complete 11/06/2024  Allergen Reaction Noted   Penicillins Swelling 10/30/2019   Wellbutrin [bupropion]  10/30/2019    Review of Systems   General: Negative for anorexia, weight loss, fever, chills, fatigue, weakness. ENT: Negative for hoarseness, difficulty swallowing , nasal congestion. CV: Negative for chest pain, angina, palpitations, dyspnea on exertion, peripheral edema.  Respiratory: Negative for dyspnea at rest, dyspnea on exertion, cough, sputum, wheezing.  GI: See history of present illness. GU:  Negative for dysuria, hematuria, urinary incontinence, urinary frequency, nocturnal urination.  Endo: Negative for unusual weight change.     Physical Exam   BP (!) 150/77   Pulse 67   Temp 98.8 F (37.1 C) (Oral)   Ht 5' 4 (1.626 m)   Wt 167 lb 3.2 oz (75.8 kg)   SpO2 90%   BMI 28.70 kg/m    General: Well-nourished, well-developed in no acute distress.  Eyes: No icterus. Mouth: Oropharyngeal mucosa moist and pink   Lungs: Clear to auscultation bilaterally.  Heart: Regular rate and rhythm, no murmurs rubs or gallops.  Abdomen: Bowel sounds are normal nondistended, no hepatosplenomegaly or masses, no abdominal bruits or  hernia , no rebound or guarding. Mild generalized tenderness Rectal: not performed Extremities: No lower extremity edema. No clubbing or deformities. Neuro: Alert and oriented x 4   Skin: Warm and dry, no jaundice.   Psych: Alert and cooperative, normal mood and affect.  Labs   Lab Results  Component Value Date   NA 142 09/30/2024   CL 101 09/30/2024   K 4.6 09/30/2024   CO2 33 (H) 09/30/2024   BUN 10 09/30/2024   CREATININE 0.70 09/30/2024   GFRNONAA >60 09/30/2024   CALCIUM  10.5 (H) 09/30/2024   PHOS 3.3 04/08/2022   ALBUMIN 4.3 09/30/2024   GLUCOSE 165 (H) 09/30/2024   Lab Results  Component Value Date  WBC 8.7 09/30/2024   HGB 14.1 09/30/2024   HCT 45.5 09/30/2024   MCV 98.5 09/30/2024   PLT 299 09/30/2024   Lab Results  Component Value Date   ALT 42 09/30/2024   AST 36 09/30/2024   ALKPHOS 91 09/30/2024   BILITOT 0.5 09/30/2024   Lab Results  Component Value Date   HGBA1C 6.4 (H) 02/20/2024    Imaging Studies   No results found.  Assessment/Plan:   GERD: -start back on pantoprazole  40mg  daily before breakfast -continue famotidine  at bedtime -can add Reflux Gourmet Rescue 1 tsp at bedtime if still needed after adding pantoprazole   Alternating constipation/diarrhea, abd pain: may be secondary to IBS but nocturnal stools are red flag. Colonoscopy 05/2022 reassuring.  -fecal calprotectin, fecal elastase, GI profile -TSH/free T4, TTG IgA, IgA, CRP, sed rate, lipase, CBC, CMET  Total time spent reviewing records, face to face with patient, completing documentation was 30 minutes  Sonny RAMAN. Ezzard, MHS, PA-C [facility]

## 2024-11-06 NOTE — Patient Instructions (Addendum)
 Start pantoprazole  40mg  daily before breakfast.  Continue famotidine  (Pepcid ) at bedtime.  If your acid reflux and regurgitation are not controlled after about 2 weeks, then you can add one teaspoon of Reflux Gourmet Rescue (comes in mint chocolate and vanilla caramel), which can be purchased on [employer] or at [email or web address]  Complete stool studies and labs. We will be in touch with results as available.

## 2024-12-02 ENCOUNTER — Encounter: Payer: Self-pay | Admitting: *Deleted

## 2024-12-10 ENCOUNTER — Ambulatory Visit: Admitting: Gastroenterology

## 2024-12-13 ENCOUNTER — Encounter: Payer: Self-pay | Admitting: Internal Medicine

## 2025-01-08 ENCOUNTER — Other Ambulatory Visit: Payer: Self-pay | Admitting: Podiatry

## 2025-01-15 ENCOUNTER — Ambulatory Visit: Admitting: Internal Medicine

## 2025-05-05 ENCOUNTER — Inpatient Hospital Stay

## 2025-05-05 ENCOUNTER — Ambulatory Visit (HOSPITAL_COMMUNITY)

## 2025-05-06 ENCOUNTER — Inpatient Hospital Stay

## 2025-05-13 ENCOUNTER — Inpatient Hospital Stay: Admitting: Oncology

## 2025-05-20 ENCOUNTER — Inpatient Hospital Stay: Admitting: Oncology
